# Patient Record
Sex: Male | Born: 1970 | Race: Black or African American | Hispanic: No | Marital: Single | Smoking: Former smoker
Health system: Southern US, Community
[De-identification: ages and names within clinical notes are randomized; demographics above are authoritative.]

## PROBLEM LIST (undated history)

## (undated) DIAGNOSIS — I219 Acute myocardial infarction, unspecified: Secondary | ICD-10-CM

## (undated) HISTORY — PX: PACEMAKER IMPLANT: EP1218

## (undated) HISTORY — DX: Acute myocardial infarction, unspecified: I21.9

---

## 2017-06-27 ENCOUNTER — Emergency Department
Admission: EM | Admit: 2017-06-27 | Discharge: 2017-06-27 | Disposition: A | Discharge status: Home / Self Care | Payer: Self-pay | Attending: Emergency Medicine | Admitting: Emergency Medicine

## 2017-06-27 ENCOUNTER — Emergency Department: Payer: Self-pay

## 2017-06-27 ENCOUNTER — Other Ambulatory Visit: Payer: Self-pay

## 2017-06-27 ENCOUNTER — Encounter: Payer: Self-pay | Admitting: Emergency Medicine

## 2017-06-27 DIAGNOSIS — S0081XA Abrasion of other part of head, initial encounter: Secondary | ICD-10-CM

## 2017-06-27 DIAGNOSIS — S0083XA Contusion of other part of head, initial encounter: Secondary | ICD-10-CM | POA: Insufficient documentation

## 2017-06-27 DIAGNOSIS — Y998 Other external cause status: Secondary | ICD-10-CM | POA: Insufficient documentation

## 2017-06-27 DIAGNOSIS — F17200 Nicotine dependence, unspecified, uncomplicated: Secondary | ICD-10-CM | POA: Insufficient documentation

## 2017-06-27 DIAGNOSIS — S0990XA Unspecified injury of head, initial encounter: Secondary | ICD-10-CM

## 2017-06-27 DIAGNOSIS — Y9355 Activity, bike riding: Secondary | ICD-10-CM | POA: Insufficient documentation

## 2017-06-27 DIAGNOSIS — Y92410 Unspecified street and highway as the place of occurrence of the external cause: Secondary | ICD-10-CM | POA: Insufficient documentation

## 2017-06-27 MED ORDER — MELOXICAM 15 MG PO TABS: 15.0000 mg | ORAL_TABLET | Freq: Every day | ORAL | 0 refills | Status: DC

## 2017-06-27 MED ORDER — OXYCODONE-ACETAMINOPHEN 5-325 MG PO TABS
1.0000 | ORAL_TABLET | Freq: Once | ORAL | Status: AC
  Administered 2017-06-27: 1 via ORAL
  Filled 2017-06-27: qty 1

## 2017-06-27 MED ORDER — HYDROCODONE-ACETAMINOPHEN 5-325 MG PO TABS: 1.0000 | ORAL_TABLET | ORAL | 0 refills | Status: DC | PRN

## 2017-06-27 MED ORDER — PREDNISONE 50 MG PO TABS: 50.0000 mg | ORAL_TABLET | Freq: Every day | ORAL | 0 refills | Status: DC
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
## 2019-05-27 ENCOUNTER — Other Ambulatory Visit: Payer: Self-pay

## 2019-05-27 ENCOUNTER — Encounter: Payer: Self-pay | Admitting: Intensive Care

## 2019-05-27 ENCOUNTER — Emergency Department: Payer: BLUE CROSS/BLUE SHIELD

## 2019-05-27 ENCOUNTER — Emergency Department
Admission: EM | Admit: 2019-05-27 | Discharge: 2019-05-27 | Disposition: A | Discharge status: Home / Self Care | Payer: BLUE CROSS/BLUE SHIELD | Attending: Student in an Organized Health Care Education/Training Program | Admitting: Student in an Organized Health Care Education/Training Program

## 2019-05-27 DIAGNOSIS — R079 Chest pain, unspecified: Secondary | ICD-10-CM

## 2019-05-27 DIAGNOSIS — R0789 Other chest pain: Secondary | ICD-10-CM | POA: Diagnosis present

## 2019-05-27 DIAGNOSIS — F17210 Nicotine dependence, cigarettes, uncomplicated: Secondary | ICD-10-CM | POA: Insufficient documentation

## 2019-05-27 LAB — CBC
HCT: 45.5 % (ref 39.0–52.0)
Hemoglobin: 14.4 g/dL (ref 13.0–17.0)
MCH: 27.5 pg (ref 26.0–34.0)
MCHC: 31.6 g/dL (ref 30.0–36.0)
MCV: 87 fL (ref 80.0–100.0)
Platelets: 276 10*3/uL (ref 150–400)
RBC: 5.23 MIL/uL (ref 4.22–5.81)
RDW: 12.8 % (ref 11.5–15.5)
WBC: 5.2 10*3/uL (ref 4.0–10.5)
nRBC: 0 % (ref 0.0–0.2)

## 2019-05-27 LAB — BASIC METABOLIC PANEL
Anion gap: 9 (ref 5–15)
BUN: 10 mg/dL (ref 6–20)
CO2: 26 mmol/L (ref 22–32)
Calcium: 8.8 mg/dL — ABNORMAL LOW (ref 8.9–10.3)
Chloride: 102 mmol/L (ref 98–111)
Creatinine, Ser: 1.05 mg/dL (ref 0.61–1.24)
GFR calc Af Amer: >60 mL/min (ref >60)
GFR calc non Af Amer: >60 mL/min (ref >60)
Glucose, Bld: 101 mg/dL — ABNORMAL HIGH (ref 70–99)
Potassium: 4.2 mmol/L (ref 3.5–5.1)
Sodium: 137 mmol/L (ref 135–145)

## 2019-05-27 LAB — TROPONIN I (HIGH SENSITIVITY)
Troponin I (High Sensitivity): <2 ng/L (ref <18)
Troponin I (High Sensitivity): <2 ng/L (ref <18)

## 2019-05-27 MED ORDER — PREDNISONE 20 MG PO TABS: 40.0000 mg | ORAL_TABLET | Freq: Every day | ORAL | 0 refills | Status: AC

## 2019-05-27 MED ORDER — HYDROCODONE-ACETAMINOPHEN 5-325 MG PO TABS
1.0000 | ORAL_TABLET | Freq: Once | ORAL | Status: AC
  Administered 2019-05-27: 1 via ORAL
  Filled 2019-05-27: qty 1

## 2019-05-27 MED ORDER — NAPROXEN 500 MG PO TABS: 500.0000 mg | ORAL_TABLET | Freq: Two times a day (BID) | ORAL | 0 refills | Status: AC

## 2019-05-27 MED ORDER — ALBUTEROL SULFATE HFA 108 (90 BASE) MCG/ACT IN AERS: 2.0000 | INHALATION_SPRAY | Freq: Four times a day (QID) | RESPIRATORY_TRACT | 1 refills | Status: DC | PRN

## 2019-05-27 NOTE — ED Notes (Signed)
Pt reports intermittent chest pain that started yesterday. Pt reports that the pain feels like it is coming from the inside. Pt reports that he recently quit smoking, pt bought a vape and has been using this. Pt believes that the pain may be coming from the vape. Pt reports that he was at work when the pain started. Pt denies any heavy lifting at work. Pt denies shortness of breath, N/V, diaphoresis, or previous cardiac hx.

## 2019-05-27 NOTE — ED Triage Notes (Signed)
First RN Note: Pt presents to ED via POV, c/o pain to L rib cage, pt states vaped yesterday after work and hasn't vaped since then.

## 2019-05-27 NOTE — ED Notes (Signed)
Light green tube sent down with white pt label, lab informed

## 2019-05-27 NOTE — ED Provider Notes (Signed)
[facility] Emergency Department Provider Note    First MD Initiated Contact with Patient 05/27/19 1552     (approximate)  I have reviewed the triage vital signs and the nursing notes.   HISTORY  Chief Complaint Chest Pain    HPI Blake Moore is a 49 y.o. male with no significant documented past medical history but does have roughly half pack per day smoking history presents the ER for 24 hours of left-sided chest pain and pressure.  Denies any shortness of breath.  States the pain is intermittent.  No associated diaphoresis.  No cough or fevers.  No congestion.  Works as a Investment banker, operational at [employer] but denies any trauma or heavy lifting.  Is never had pain like this before.  Denies any abdominal pain nausea or vomiting.  Has not taken anything for the pain.    History reviewed. No pertinent past medical history. History reviewed. No pertinent family history. History reviewed. No pertinent surgical history. There are no problems to display for this patient.     Prior to Admission medications   Medication Sig Start Date End Date Taking? Authorizing Provider  albuterol (VENTOLIN HFA) 108 (90 Base) MCG/ACT inhaler Inhale 2 puffs into the lungs every 6 (six) hours as needed for wheezing or shortness of breath. 05/27/19   Willy Eddy, MD  HYDROcodone-acetaminophen (NORCO/VICODIN) 5-325 MG tablet Take 1 tablet by mouth every 4 (four) hours as needed for moderate pain. 06/27/17   Cuthriell, Delorise Royals, PA-C  meloxicam (MOBIC) 15 MG tablet Take 1 tablet (15 mg total) by mouth daily. 06/27/17   Cuthriell, Delorise Royals, PA-C  naproxen (NAPROSYN) 500 MG tablet Take 1 tablet (500 mg total) by mouth 2 (two) times daily with a meal. 05/27/19 05/26/20  Willy Eddy, MD  predniSONE (DELTASONE) 20 MG tablet Take 2 tablets (40 mg total) by mouth daily for 5 days. 05/27/19 06/01/19  Willy Eddy, MD  predniSONE (DELTASONE) 50 MG tablet Take 1 tablet (50 mg total) by mouth  daily with breakfast. 06/27/17   Cuthriell, Delorise Royals, PA-C    Allergies Patient has no known allergies.    Social History Social History   Tobacco Use  . Smoking status: Current Some Day Smoker    Types: Cigarettes  . Smokeless tobacco: Never Used  Substance Use Topics  . Alcohol use: Yes    Alcohol/week: 4.0 standard drinks    Types: 4 Cans of beer per week  . Drug use: Yes    Types: Marijuana    Review of Systems Patient denies headaches, rhinorrhea, blurry vision, numbness, shortness of breath, chest pain, edema, cough, abdominal pain, nausea, vomiting, diarrhea, dysuria, fevers, rashes or hallucinations unless otherwise stated above in HPI. ____________________________________________   PHYSICAL EXAM:  VITAL SIGNS: Vitals:   05/27/19 1420 05/27/19 1750  BP: 102/66 108/65  Pulse: 78 (!) 54  Resp: 16 18  Temp: 98.7 F (37.1 C) 97.8 F (36.6 C)  SpO2: 99% 96%    Constitutional: Alert and oriented.  Eyes: Conjunctivae are normal.  Head: Atraumatic. Nose: No congestion/rhinnorhea. Mouth/Throat: Mucous membranes are moist.   Neck: No stridor. Painless ROM.  Cardiovascular: Normal rate, regular rhythm. Grossly normal heart sounds.  Good peripheral circulation. Respiratory: Normal respiratory effort.  No retractions. Lungs CTAB. Gastrointestinal: Soft and nontender. No distention. No abdominal bruits. No CVA tenderness. Genitourinary:  Musculoskeletal: No lower extremity tenderness nor edema.  No joint effusions. Neurologic:  Normal speech and language. No gross focal neurologic deficits are  appreciated. No facial droop Skin:  Skin is warm, dry and intact. No rash noted. Psychiatric: Mood and affect are normal. Speech and behavior are normal.  ____________________________________________   LABS (all labs ordered are listed, but only abnormal results are displayed)  Results for orders placed or performed during the hospital encounter of 05/27/19 (from the past  24 hour(s))  Basic metabolic panel     Status: Abnormal   Collection Time: 05/27/19  2:24 PM  Result Value Ref Range   Sodium 137 135 - 145 mmol/L   Potassium 4.2 3.5 - 5.1 mmol/L   Chloride 102 98 - 111 mmol/L   CO2 26 22 - 32 mmol/L   Glucose, Bld 101 (H) 70 - 99 mg/dL   BUN 10 6 - 20 mg/dL   Creatinine, Ser 8.58 0.61 - 1.24 mg/dL   Calcium 8.8 (L) 8.9 - 10.3 mg/dL   GFR calc non Af Amer >60 >60 mL/min   GFR calc Af Amer >60 >60 mL/min   Anion gap 9 5 - 15  CBC     Status: None   Collection Time: 05/27/19  2:24 PM  Result Value Ref Range   WBC 5.2 4.0 - 10.5 K/uL   RBC 5.23 4.22 - 5.81 MIL/uL   Hemoglobin 14.4 13.0 - 17.0 g/dL   HCT 85.0 27.7 - 41.2 %   MCV 87.0 80.0 - 100.0 fL   MCH 27.5 26.0 - 34.0 pg   MCHC 31.6 30.0 - 36.0 g/dL   RDW 87.8 67.6 - 72.0 %   Platelets 276 150 - 400 K/uL   nRBC 0.0 0.0 - 0.2 %  Troponin I (High Sensitivity)     Status: None   Collection Time: 05/27/19  2:24 PM  Result Value Ref Range   Troponin I (High Sensitivity) <2 <18 ng/L  Troponin I (High Sensitivity)     Status: None   Collection Time: 05/27/19  4:53 PM  Result Value Ref Range   Troponin I (High Sensitivity) <2 <18 ng/L   ____________________________________________  EKG My review and personal interpretation at Time: 14:14   Indication: chest pain  Rate: 75  Rhythm: sinus Axis: normal Other: concave upwards st abnormality without reciprocal changes, no pr depression, no stemi criteria, likely BER ____________________________________________  RADIOLOGY  I personally reviewed all radiographic images ordered to evaluate for the above acute complaints and reviewed radiology reports and findings.  These findings were personally discussed with the patient.  Please see medical record for radiology report.  ____________________________________________   PROCEDURES  Procedure(s) performed:  Procedures    Critical Care performed:  no ____________________________________________   INITIAL IMPRESSION / ASSESSMENT AND PLAN / ED COURSE  Pertinent labs & imaging results that were available during my care of the patient were reviewed by me and considered in my medical decision making (see chart for details).   DDX: ACS, pericarditis, esophagitis, boerhaaves, pe, dissection, pna, bronchitis, costochondritis   Pratyush Ammon is a 49 y.o. who presents to the ED with symptoms as described above.  Patient low risk by heart score.  Has nonspecific ST changes likely secondary to benign early repole.  Low risk by Wells criteria and is PERC negative.  Not consistent with dissection.  Abdominal exam is soft and benign.  Given his smoking history suspect bronchitis will order chest x-ray will order serial enzymes.  Clinical Course as of May 26 1754  Caleen Essex May 27, 2019  1729 Repeat troponin negative.  Remains hemodynamically stable in no acute distress.  Will treat for bronchitis.  Have discussed with the patient and available family all diagnostics and treatments performed thus far and all questions were answered to the best of my ability. The patient demonstrates understanding and agreement with plan.    [PR]    Clinical Course User Index [PR] Merlyn Lot, MD   The patient was evaluated in Emergency Department today for the symptoms described in the history of present illness. He/she was evaluated in the context of the global COVID-19 pandemic, which necessitated consideration that the patient might be at risk for infection with the SARS-CoV-2 virus that causes COVID-19. Institutional protocols and algorithms that pertain to the evaluation of patients at risk for COVID-19 are in a state of rapid change based on information released by regulatory bodies including the CDC and federal and state organizations. These policies and algorithms were followed during the patient's care in the ED. Patient declined Covid testing  As part of my  medical decision making, I reviewed the following data within the [place] notes reviewed and incorporated, Labs reviewed, notes from prior ED visits and [place] Controlled Substance Database   ____________________________________________   FINAL CLINICAL IMPRESSION(S) / ED DIAGNOSES  Final diagnoses:  Chest pain, unspecified type      NEW MEDICATIONS STARTED DURING THIS VISIT:  Discharge Medication List as of 05/27/2019  5:35 PM    START taking these medications   Details  albuterol (VENTOLIN HFA) 108 (90 Base) MCG/ACT inhaler Inhale 2 puffs into the lungs every 6 (six) hours as needed for wheezing or shortness of breath., Starting Fri 05/27/2019, Normal    naproxen (NAPROSYN) 500 MG tablet Take 1 tablet (500 mg total) by mouth 2 (two) times daily with a meal., Starting Fri 05/27/2019, Until Sat 05/26/2020, Normal    !! predniSONE (DELTASONE) 20 MG tablet Take 2 tablets (40 mg total) by mouth daily for 5 days., Starting Fri 05/27/2019, Until Wed 06/01/2019, Normal     !! - Potential duplicate medications found. Please discuss with provider.       Note:  This document was prepared using Dragon voice recognition software and may include unintentional dictation errors.    Merlyn Lot, MD 05/27/19 [phone]

## 2019-05-27 NOTE — ED Triage Notes (Signed)
Patient c/o poking/grabbing left chest pain that started last night. Also felt this AM. No radiation

## 2020-09-18 ENCOUNTER — Other Ambulatory Visit: Payer: Self-pay

## 2020-09-18 ENCOUNTER — Emergency Department
Admission: EM | Admit: 2020-09-18 | Discharge: 2020-09-18 | Disposition: A | Discharge status: Home / Self Care | Payer: BLUE CROSS/BLUE SHIELD | Attending: Student in an Organized Health Care Education/Training Program | Admitting: Student in an Organized Health Care Education/Training Program

## 2020-09-18 DIAGNOSIS — R112 Nausea with vomiting, unspecified: Secondary | ICD-10-CM | POA: Insufficient documentation

## 2020-09-18 DIAGNOSIS — R42 Dizziness and giddiness: Secondary | ICD-10-CM | POA: Insufficient documentation

## 2020-09-18 DIAGNOSIS — F17210 Nicotine dependence, cigarettes, uncomplicated: Secondary | ICD-10-CM | POA: Insufficient documentation

## 2020-09-18 LAB — CBC
HCT: 44.2 % (ref 39.0–52.0)
Hemoglobin: 14.4 g/dL (ref 13.0–17.0)
MCH: 27.4 pg (ref 26.0–34.0)
MCHC: 32.6 g/dL (ref 30.0–36.0)
MCV: 84.2 fL (ref 80.0–100.0)
Platelets: 289 10*3/uL (ref 150–400)
RBC: 5.25 MIL/uL (ref 4.22–5.81)
RDW: 13.1 % (ref 11.5–15.5)
WBC: 5.4 10*3/uL (ref 4.0–10.5)
nRBC: 0 % (ref 0.0–0.2)

## 2020-09-18 LAB — BASIC METABOLIC PANEL
Anion gap: 8 (ref 5–15)
BUN: 10 mg/dL (ref 6–20)
CO2: 25 mmol/L (ref 22–32)
Calcium: 8.9 mg/dL (ref 8.9–10.3)
Chloride: 104 mmol/L (ref 98–111)
Creatinine, Ser: 0.86 mg/dL (ref 0.61–1.24)
GFR, Estimated: >60 mL/min (ref >60)
Glucose, Bld: 96 mg/dL (ref 70–99)
Potassium: 3.8 mmol/L (ref 3.5–5.1)
Sodium: 137 mmol/L (ref 135–145)

## 2020-09-18 LAB — HEPATIC FUNCTION PANEL
ALT: 15 U/L (ref 0–44)
AST: 18 U/L (ref 15–41)
Albumin: 4.3 g/dL (ref 3.5–5.0)
Alkaline Phosphatase: 59 U/L (ref 38–126)
Bilirubin, Direct: <0.1 mg/dL (ref 0.0–0.2)
Total Bilirubin: 0.8 mg/dL (ref 0.3–1.2)
Total Protein: 7.4 g/dL (ref 6.5–8.1)

## 2020-09-18 LAB — LIPASE, BLOOD: Lipase: 28 U/L (ref 11–51)

## 2020-09-18 NOTE — ED Triage Notes (Signed)
Pt comes with c/o dizziness and vomiting that started this am. Pt denies any other complaints.

## 2020-09-18 NOTE — ED Provider Notes (Signed)
[facility] Emergency Department Provider Note    Event Date/Time   First MD Initiated Contact with Patient 09/18/20 1312     (approximate)  I have reviewed the triage vital signs and the nursing notes.   HISTORY  Chief Complaint Dizziness    HPI Blake Moore is a 50 y.o. male to ER for evaluation of episode of nausea dizziness that occurred this morning after he got out of bed.  Denies any symptoms at this time.  Denied any pain.  States he felt woozy and lightheaded when he stood up.  Felt like he might be a little dehydrated so he drank some water and then had an episode of nonbilious nonbloody emesis.  Denies any abdominal pain.  Did endorse drinking some beers last night.  Denies any numbness or tingling.  No double vision.  Does occasionally smoke black and milds.  Denies any trauma.    History reviewed. No pertinent past medical history. No family history on file. History reviewed. No pertinent surgical history. There are no problems to display for this patient.     Prior to Admission medications   Medication Sig Start Date End Date Taking? Authorizing Provider  albuterol (VENTOLIN HFA) 108 (90 Base) MCG/ACT inhaler Inhale 2 puffs into the lungs every 6 (six) hours as needed for wheezing or shortness of breath. 05/27/19   Willy Eddy, MD  HYDROcodone-acetaminophen (NORCO/VICODIN) 5-325 MG tablet Take 1 tablet by mouth every 4 (four) hours as needed for moderate pain. 06/27/17   Cuthriell, Delorise Royals, PA-C  meloxicam (MOBIC) 15 MG tablet Take 1 tablet (15 mg total) by mouth daily. 06/27/17   Cuthriell, Delorise Royals, PA-C  predniSONE (DELTASONE) 50 MG tablet Take 1 tablet (50 mg total) by mouth daily with breakfast. 06/27/17   Cuthriell, Delorise Royals, PA-C    Allergies Patient has no known allergies.    Social History Social History   Tobacco Use  . Smoking status: Current Some Day Smoker    Types: Cigarettes  . Smokeless tobacco: Never Used   Substance Use Topics  . Alcohol use: Yes    Alcohol/week: 4.0 standard drinks    Types: 4 Cans of beer per week    Comment: everyday, couple of beers  . Drug use: Not Currently    Types: Marijuana    Review of Systems Patient denies headaches, rhinorrhea, blurry vision, numbness, shortness of breath, chest pain, edema, cough, abdominal pain, nausea, vomiting, diarrhea, dysuria, fevers, rashes or hallucinations unless otherwise stated above in HPI. ____________________________________________   PHYSICAL EXAM:  VITAL SIGNS: Vitals:   09/18/20 1154  BP: 128/79  Pulse: 72  Resp: 18  Temp: 98.6 F (37 C)  SpO2: 100%    Constitutional: Alert and oriented.  Eyes: Conjunctivae are normal.  Head: Atraumatic. Nose: No congestion/rhinnorhea. Mouth/Throat: Mucous membranes are moist.   Neck: No stridor. Painless ROM.  Cardiovascular: Normal rate, regular rhythm. Grossly normal heart sounds.  Good peripheral circulation. Respiratory: Normal respiratory effort.  No retractions. Lungs CTAB. Gastrointestinal: Soft and nontender. No distention. No abdominal bruits. No CVA tenderness. Genitourinary:  Musculoskeletal: No lower extremity tenderness nor edema.  No joint effusions. Neurologic:  CN- intact.  No facial droop, Normal FNF.  Normal heel to shin.  Sensation intact bilaterally. Normal speech and language. No gross focal neurologic deficits are appreciated. No gait instability.  Benign HINTs exam Skin:  Skin is warm, dry and intact. No rash noted. Psychiatric: Mood and affect are normal. Speech and behavior are  normal.  ____________________________________________   LABS (all labs ordered are listed, but only abnormal results are displayed)  Results for orders placed or performed during the hospital encounter of 09/18/20 (from the past 24 hour(s))  Basic metabolic panel     Status: None   Collection Time: 09/18/20 11:54 AM  Result Value Ref Range   Sodium 137 135 - 145 mmol/L    Potassium 3.8 3.5 - 5.1 mmol/L   Chloride 104 98 - 111 mmol/L   CO2 25 22 - 32 mmol/L   Glucose, Bld 96 70 - 99 mg/dL   BUN 10 6 - 20 mg/dL   Creatinine, Ser 0.86 0.61 - 1.24 mg/dL   Calcium 8.9 8.9 - 57.8 mg/dL   GFR, Estimated >46 >96 mL/min   Anion gap 8 5 - 15  CBC     Status: None   Collection Time: 09/18/20 11:54 AM  Result Value Ref Range   WBC 5.4 4.0 - 10.5 K/uL   RBC 5.25 4.22 - 5.81 MIL/uL   Hemoglobin 14.4 13.0 - 17.0 g/dL   HCT 29.5 28.4 - 13.2 %   MCV 84.2 80.0 - 100.0 fL   MCH 27.4 26.0 - 34.0 pg   MCHC 32.6 30.0 - 36.0 g/dL   RDW 44.0 10.2 - 72.5 %   Platelets 289 150 - 400 K/uL   nRBC 0.0 0.0 - 0.2 %  Hepatic function panel     Status: None   Collection Time: 09/18/20 11:54 AM  Result Value Ref Range   Total Protein 7.4 6.5 - 8.1 g/dL   Albumin 4.3 3.5 - 5.0 g/dL   AST 18 15 - 41 U/L   ALT 15 0 - 44 U/L   Alkaline Phosphatase 59 38 - 126 U/L   Total Bilirubin 0.8 0.3 - 1.2 mg/dL   Bilirubin, Direct <3.6 0.0 - 0.2 mg/dL   Indirect Bilirubin NOT CALCULATED 0.3 - 0.9 mg/dL  Lipase, blood     Status: None   Collection Time: 09/18/20 11:54 AM  Result Value Ref Range   Lipase 28 11 - 51 U/L   ____________________________________________  EKG My review and personal interpretation at Time: 11:54   Indication: dizziness  Rate: 70  Rhythm: sinus Axis: normal Other: borderline lvh, no stemi, v2 and v3 reversal.  consisten with previous tracing  ____________________________________________  RADIOLOGY   ____________________________________________   PROCEDURES  Procedure(s) performed:  Procedures    Critical Care performed: no ____________________________________________   INITIAL IMPRESSION / ASSESSMENT AND PLAN / ED COURSE  Pertinent labs & imaging results that were available during my care of the patient were reviewed by me and considered in my medical decision making (see chart for details).   DDX: Dehydration, enteritis, gastritis,  electrolyte abnormality, pancreatitis, CVA, BPPV  Blake Moore is a 50 y.o. who presents to the ED with presentation as described above.  Patient is asymptomatic and well-appearing on [place].  His neuro exam was completely benign.  Benign hints exam.  Is not consistent with TIA or CVA.  Possible vertiginous symptoms as he had symptoms when he stood up but feel like this is more likely mild dehydration blood work is reassuring.  Tolerating p.o.  Denies any chest pain or shortness of breath.  Abdominal exam soft and benign.  Does appear stable appropriate for outpatient follow-up.  Patient agreeable to plan.  Discussed signs and sx for which he should to the ER.     The patient was evaluated in Emergency Department today for  the symptoms described in the history of present illness. He/she was evaluated in the context of the global COVID-19 pandemic, which necessitated consideration that the patient might be at risk for infection with the SARS-CoV-2 virus that causes COVID-19. Institutional protocols and algorithms that pertain to the evaluation of patients at risk for COVID-19 are in a state of rapid change based on information released by regulatory bodies including the CDC and federal and state organizations. These policies and algorithms were followed during the patient's care in the ED.  As part of my medical decision making, I reviewed the following data within the electronic MEDICAL RECORD NUMBER Nursing notes reviewed and incorporated, Labs reviewed, notes from prior ED visits and [place] Controlled Substance Database   ____________________________________________   FINAL CLINICAL IMPRESSION(S) / ED DIAGNOSES  Final diagnoses:  Dizziness  Lightheadedness      NEW MEDICATIONS STARTED DURING THIS VISIT:  New Prescriptions   No medications on file     Note:  This document was prepared using Dragon voice recognition software and may include unintentional dictation errors.    Willy Eddy, MD 09/18/20 1351

## 2020-09-20 ENCOUNTER — Telehealth: Payer: Self-pay

## 2020-09-20 NOTE — Telephone Encounter (Signed)
After receiving an ER referral for pt, called and explained application process. Pt is interested in becoming a pt and I mailed him an application.   MD 09/20/20 @ 1:55 pm

## 2021-10-10 ENCOUNTER — Other Ambulatory Visit: Payer: Self-pay

## 2021-10-10 ENCOUNTER — Emergency Department
Admission: EM | Admit: 2021-10-10 | Discharge: 2021-10-10 | Disposition: A | Discharge status: Home / Self Care | Payer: 59 | Attending: Emergency Medicine | Admitting: Emergency Medicine

## 2021-10-10 ENCOUNTER — Emergency Department: Payer: 59

## 2021-10-10 DIAGNOSIS — R0789 Other chest pain: Secondary | ICD-10-CM | POA: Diagnosis not present

## 2021-10-10 DIAGNOSIS — R079 Chest pain, unspecified: Secondary | ICD-10-CM | POA: Diagnosis not present

## 2021-10-10 LAB — CBC
HCT: 43.7 % (ref 39.0–52.0)
Hemoglobin: 13.4 g/dL (ref 13.0–17.0)
MCH: 26.6 pg (ref 26.0–34.0)
MCHC: 30.7 g/dL (ref 30.0–36.0)
MCV: 86.9 fL (ref 80.0–100.0)
Platelets: 286 10*3/uL (ref 150–400)
RBC: 5.03 MIL/uL (ref 4.22–5.81)
RDW: 12.8 % (ref 11.5–15.5)
WBC: 6 10*3/uL (ref 4.0–10.5)
nRBC: 0 % (ref 0.0–0.2)

## 2021-10-10 LAB — BASIC METABOLIC PANEL
Anion gap: 6 (ref 5–15)
BUN: 15 mg/dL (ref 6–20)
CO2: 29 mmol/L (ref 22–32)
Calcium: 9 mg/dL (ref 8.9–10.3)
Chloride: 103 mmol/L (ref 98–111)
Creatinine, Ser: 0.89 mg/dL (ref 0.61–1.24)
GFR, Estimated: >60 mL/min (ref >60)
Glucose, Bld: 104 mg/dL — ABNORMAL HIGH (ref 70–99)
Potassium: 4.3 mmol/L (ref 3.5–5.1)
Sodium: 138 mmol/L (ref 135–145)

## 2021-10-10 LAB — TROPONIN I (HIGH SENSITIVITY): Troponin I (High Sensitivity): <2 ng/L (ref <18)

## 2021-10-10 NOTE — ED Provider Notes (Signed)
[facility] Provider Note    Event Date/Time   First MD Initiated Contact with Patient 10/10/21 1144     (approximate)   History   Chief Complaint Chest Pain   HPI  Blake Moore is a 51 y.o. male with no significant past medical history who presents to the ED complaining of chest pain.  Patient reports that he has been dealing with 1 week of intermittent dull aching pain in the left side of his chest.  Pain can come on at any time and is not exacerbated or alleviated by anything in particular.  He denies any associated fevers, cough, shortness of breath, nausea, vomiting, or abdominal pain.  He states that pain will typically last for a few minutes before resolving on its own.  When he had another episode today while at work about 3 hours ago, he decided to present to the walk-in clinic, was subsequently referred to the ED for further evaluation.  He now states that the pain in his chest has resolved.     Physical Exam   Triage Vital Signs: ED Triage Vitals  Enc Vitals Group     BP 10/10/21 1053 118/75     Pulse Rate 10/10/21 1053 73     Resp 10/10/21 1053 16     Temp 10/10/21 1053 98.8 F (37.1 C)     Temp Source 10/10/21 1053 Oral     SpO2 10/10/21 1053 97 %     Weight 10/10/21 1051 180 lb (81.6 kg)     Height 10/10/21 1051 6\' 1"  (1.854 m)     Head Circumference --      Peak Flow --      Pain Score --      Pain Loc --      Pain Edu? --      Excl. in [place]? --     Most recent vital signs: Vitals:   10/10/21 1053  BP: 118/75  Pulse: 73  Resp: 16  Temp: 98.8 F (37.1 C)  SpO2: 97%    Constitutional: Alert and oriented. Eyes: Conjunctivae are normal. Head: Atraumatic. Nose: No congestion/rhinnorhea. Mouth/Throat: Mucous membranes are moist.  Cardiovascular: Normal rate, regular rhythm. Grossly normal heart sounds.  2+ radial pulses bilaterally. Respiratory: Normal respiratory effort.  No retractions. Lungs CTAB.  No chest wall  tenderness to palpation. Gastrointestinal: Soft and nontender. No distention. Musculoskeletal: No lower extremity tenderness nor edema.  Neurologic:  Normal speech and language. No gross focal neurologic deficits are appreciated.    ED Results / Procedures / Treatments   Labs (all labs ordered are listed, but only abnormal results are displayed) Labs Reviewed  BASIC METABOLIC PANEL - Abnormal; Notable for the following components:      Result Value   Glucose, Bld 104 (*)    All other components within normal limits  CBC  TROPONIN I (HIGH SENSITIVITY)     EKG  ED ECG REPORT I, Blake Divine, the attending physician, personally viewed and interpreted this ECG.   Date: 10/10/2021  EKG Time: 10:56  Rate: 72  Rhythm: normal sinus rhythm  Axis: Normal  Intervals:none  ST&T Change: Nonspecific ST changes, similar to previous  RADIOLOGY Chest x-ray reviewed and interpreted by me with no infiltrate, edema, or effusion.  PROCEDURES:  Critical Care performed: No  Procedures   MEDICATIONS ORDERED IN ED: Medications - No data to display   IMPRESSION / MDM / [place] / ED COURSE  I reviewed the triage  vital signs and the nursing notes.                              51 y.o. male with no significant past medical history presents to the ED complaining of intermittent left-sided dull aching pain in his chest present for the past week.  Differential diagnosis includes, but is not limited to, ACS, PE, pneumonia, pneumothorax, GERD, musculoskeletal pain.  Patient well-appearing and in no acute distress, vital signs are unremarkable and he states that chest pain has resolved.  EKG shows no evidence of arrhythmia or ischemia and troponin is negative, doubt ACS given symptoms for 1 week and symptoms starting greater than 3 hours ago today.  Chest x-ray is unremarkable and remainder of labs are reassuring, CBC shows no anemia or leukocytosis, BMP without electrolyte  abnormality or AKI.  Doubt PE or dissection given resolution of pain.  Patient is appropriate for discharge home and was counseled to establish care with PCP, otherwise return to the ED for new or worsening symptoms.  Patient agrees with plan.      FINAL CLINICAL IMPRESSION(S) / ED DIAGNOSES   Final diagnoses:  Nonspecific chest pain     Rx / DC Orders   ED Discharge Orders     None        Note:  This document was prepared using Dragon voice recognition software and may include unintentional dictation errors.   Blake Divine, MD 10/10/21 1208

## 2021-10-10 NOTE — ED Notes (Signed)
See triage note, pt reports upper left chest pain for the past couple days that is getting more constant. Denies n/v/shob, dizziness.  NAD noted. Speaking in complete sentences, RR even and unlabored.

## 2021-10-10 NOTE — ED Triage Notes (Signed)
Pt sent from [facility] with c/o left sided chest pain for the past week. Denies SOB , N/V or diaphoresis. Pt is in NAD at present

## 2021-10-25 ENCOUNTER — Emergency Department: Payer: 59

## 2021-10-25 ENCOUNTER — Inpatient Hospital Stay: Payer: 59

## 2021-10-25 ENCOUNTER — Inpatient Hospital Stay (HOSPITAL_COMMUNITY)
Admit: 2021-10-25 | Discharge: 2021-10-25 | Disposition: A | Discharge status: Home / Self Care | Payer: 59 | Attending: Internal Medicine | Admitting: Internal Medicine

## 2021-10-25 ENCOUNTER — Inpatient Hospital Stay
Admission: EM | Admit: 2021-10-25 | Discharge: 2021-10-31 | DRG: 224 | Disposition: A | Discharge status: Home / Self Care | Payer: 59 | Attending: Internal Medicine | Admitting: Internal Medicine

## 2021-10-25 ENCOUNTER — Encounter: Payer: Self-pay | Admitting: Emergency Medicine

## 2021-10-25 DIAGNOSIS — J9602 Acute respiratory failure with hypercapnia: Secondary | ICD-10-CM | POA: Diagnosis not present

## 2021-10-25 DIAGNOSIS — I420 Dilated cardiomyopathy: Secondary | ICD-10-CM | POA: Diagnosis not present

## 2021-10-25 DIAGNOSIS — J690 Pneumonitis due to inhalation of food and vomit: Secondary | ICD-10-CM | POA: Diagnosis not present

## 2021-10-25 DIAGNOSIS — I462 Cardiac arrest due to underlying cardiac condition: Secondary | ICD-10-CM | POA: Diagnosis present

## 2021-10-25 DIAGNOSIS — D649 Anemia, unspecified: Secondary | ICD-10-CM | POA: Diagnosis not present

## 2021-10-25 DIAGNOSIS — E876 Hypokalemia: Secondary | ICD-10-CM

## 2021-10-25 DIAGNOSIS — Z452 Encounter for adjustment and management of vascular access device: Secondary | ICD-10-CM | POA: Diagnosis not present

## 2021-10-25 DIAGNOSIS — I201 Angina pectoris with documented spasm: Secondary | ICD-10-CM | POA: Diagnosis present

## 2021-10-25 DIAGNOSIS — G9341 Metabolic encephalopathy: Secondary | ICD-10-CM | POA: Diagnosis present

## 2021-10-25 DIAGNOSIS — I5021 Acute systolic (congestive) heart failure: Secondary | ICD-10-CM | POA: Diagnosis present

## 2021-10-25 DIAGNOSIS — Z743 Need for continuous supervision: Secondary | ICD-10-CM | POA: Diagnosis not present

## 2021-10-25 DIAGNOSIS — I428 Other cardiomyopathies: Secondary | ICD-10-CM

## 2021-10-25 DIAGNOSIS — D72820 Lymphocytosis (symptomatic): Secondary | ICD-10-CM | POA: Diagnosis present

## 2021-10-25 DIAGNOSIS — R918 Other nonspecific abnormal finding of lung field: Secondary | ICD-10-CM | POA: Diagnosis not present

## 2021-10-25 DIAGNOSIS — F17210 Nicotine dependence, cigarettes, uncomplicated: Secondary | ICD-10-CM | POA: Diagnosis present

## 2021-10-25 DIAGNOSIS — R4020 Unspecified coma: Secondary | ICD-10-CM | POA: Diagnosis not present

## 2021-10-25 DIAGNOSIS — R000 Tachycardia, unspecified: Secondary | ICD-10-CM | POA: Diagnosis not present

## 2021-10-25 DIAGNOSIS — J9811 Atelectasis: Secondary | ICD-10-CM | POA: Diagnosis not present

## 2021-10-25 DIAGNOSIS — I469 Cardiac arrest, cause unspecified: Secondary | ICD-10-CM | POA: Diagnosis not present

## 2021-10-25 DIAGNOSIS — I4901 Ventricular fibrillation: Principal | ICD-10-CM | POA: Diagnosis present

## 2021-10-25 DIAGNOSIS — Z4682 Encounter for fitting and adjustment of non-vascular catheter: Secondary | ICD-10-CM | POA: Diagnosis not present

## 2021-10-25 DIAGNOSIS — I213 ST elevation (STEMI) myocardial infarction of unspecified site: Secondary | ICD-10-CM | POA: Diagnosis not present

## 2021-10-25 DIAGNOSIS — I499 Cardiac arrhythmia, unspecified: Secondary | ICD-10-CM | POA: Diagnosis not present

## 2021-10-25 DIAGNOSIS — S199XXA Unspecified injury of neck, initial encounter: Secondary | ICD-10-CM | POA: Diagnosis not present

## 2021-10-25 DIAGNOSIS — Z95810 Presence of automatic (implantable) cardiac defibrillator: Secondary | ICD-10-CM | POA: Diagnosis not present

## 2021-10-25 DIAGNOSIS — J9601 Acute respiratory failure with hypoxia: Secondary | ICD-10-CM | POA: Diagnosis present

## 2021-10-25 DIAGNOSIS — F419 Anxiety disorder, unspecified: Secondary | ICD-10-CM | POA: Diagnosis not present

## 2021-10-25 DIAGNOSIS — R404 Transient alteration of awareness: Secondary | ICD-10-CM | POA: Diagnosis not present

## 2021-10-25 LAB — MAGNESIUM
Magnesium: 2.3 mg/dL (ref 1.7–2.4)
Magnesium: 2.1 mg/dL (ref 1.7–2.4)

## 2021-10-25 LAB — LIPID PANEL
Cholesterol: 124 mg/dL (ref 0–200)
HDL: 41 mg/dL (ref >40)
LDL Cholesterol: 75 mg/dL (ref 0–99)
Total CHOL/HDL Ratio: 3 RATIO
Triglycerides: 38 mg/dL (ref <150)
VLDL: 8 mg/dL (ref 0–40)

## 2021-10-25 LAB — CBC WITH DIFFERENTIAL/PLATELET
Abs Immature Granulocytes: 0.11 10*3/uL — ABNORMAL HIGH (ref 0.00–0.07)
Abs Immature Granulocytes: 0.03 10*3/uL (ref 0.00–0.07)
Basophils Absolute: 0.1 10*3/uL (ref 0.0–0.1)
Basophils Absolute: 0 10*3/uL (ref 0.0–0.1)
Basophils Relative: 1 %
Basophils Relative: 0 %
Eosinophils Absolute: 0.3 10*3/uL (ref 0.0–0.5)
Eosinophils Absolute: 0 10*3/uL (ref 0.0–0.5)
Eosinophils Relative: 3 %
Eosinophils Relative: 0 %
HCT: 43.7 % (ref 39.0–52.0)
HCT: 37.7 % — ABNORMAL LOW (ref 39.0–52.0)
Hemoglobin: 13.3 g/dL (ref 13.0–17.0)
Hemoglobin: 11.9 g/dL — ABNORMAL LOW (ref 13.0–17.0)
Immature Granulocytes: 1 %
Immature Granulocytes: 0 %
Lymphocytes Relative: 49 %
Lymphocytes Relative: 8 %
Lymphs Abs: 4.5 10*3/uL — ABNORMAL HIGH (ref 0.7–4.0)
Lymphs Abs: 0.8 10*3/uL (ref 0.7–4.0)
MCH: 26.3 pg (ref 26.0–34.0)
MCH: 26.6 pg (ref 26.0–34.0)
MCHC: 30.4 g/dL (ref 30.0–36.0)
MCHC: 31.6 g/dL (ref 30.0–36.0)
MCV: 86.5 fL (ref 80.0–100.0)
MCV: 84.3 fL (ref 80.0–100.0)
Monocytes Absolute: 0.9 10*3/uL (ref 0.1–1.0)
Monocytes Absolute: 1 10*3/uL (ref 0.1–1.0)
Monocytes Relative: 10 %
Monocytes Relative: 10 %
Neutro Abs: 3.3 10*3/uL (ref 1.7–7.7)
Neutro Abs: 7.3 10*3/uL (ref 1.7–7.7)
Neutrophils Relative %: 36 %
Neutrophils Relative %: 82 %
Platelets: 364 10*3/uL (ref 150–400)
Platelets: 275 10*3/uL (ref 150–400)
RBC: 5.05 MIL/uL (ref 4.22–5.81)
RBC: 4.47 MIL/uL (ref 4.22–5.81)
RDW: 12.7 % (ref 11.5–15.5)
RDW: 12.6 % (ref 11.5–15.5)
WBC: 9.2 10*3/uL (ref 4.0–10.5)
WBC: 9.1 10*3/uL (ref 4.0–10.5)
nRBC: 0 % (ref 0.0–0.2)
nRBC: 0 % (ref 0.0–0.2)

## 2021-10-25 LAB — BLOOD GAS, ARTERIAL
Acid-base deficit: 4.2 mmol/L — ABNORMAL HIGH (ref 0.0–2.0)
Bicarbonate: 20.1 mmol/L (ref 20.0–28.0)
FIO2: 60 %
MECHVT: 500 mL
Mechanical Rate: 18
O2 Saturation: 99.8 %
PEEP: 5 cmH2O
Patient temperature: 37
pCO2 arterial: 34 mmHg (ref 32–48)
pH, Arterial: 7.38 (ref 7.35–7.45)
pO2, Arterial: 151 mmHg — ABNORMAL HIGH (ref 83–108)

## 2021-10-25 LAB — COMPREHENSIVE METABOLIC PANEL
ALT: 101 U/L — ABNORMAL HIGH (ref 0–44)
AST: 126 U/L — ABNORMAL HIGH (ref 15–41)
Albumin: 3.7 g/dL (ref 3.5–5.0)
Alkaline Phosphatase: 93 U/L (ref 38–126)
Anion gap: 14 (ref 5–15)
BUN: 18 mg/dL (ref 6–20)
CO2: 19 mmol/L — ABNORMAL LOW (ref 22–32)
Calcium: 8.3 mg/dL — ABNORMAL LOW (ref 8.9–10.3)
Chloride: 107 mmol/L (ref 98–111)
Creatinine, Ser: 1.09 mg/dL (ref 0.61–1.24)
GFR, Estimated: >60 mL/min (ref >60)
Glucose, Bld: 242 mg/dL — ABNORMAL HIGH (ref 70–99)
Potassium: 2.7 mmol/L — CL (ref 3.5–5.1)
Sodium: 140 mmol/L (ref 135–145)
Total Bilirubin: 0.3 mg/dL (ref 0.3–1.2)
Total Protein: 7.1 g/dL (ref 6.5–8.1)

## 2021-10-25 LAB — GLUCOSE, CAPILLARY
Glucose-Capillary: 152 mg/dL — ABNORMAL HIGH (ref 70–99)
Glucose-Capillary: 104 mg/dL — ABNORMAL HIGH (ref 70–99)
Glucose-Capillary: 98 mg/dL (ref 70–99)
Glucose-Capillary: 117 mg/dL — ABNORMAL HIGH (ref 70–99)
Glucose-Capillary: 85 mg/dL (ref 70–99)

## 2021-10-25 LAB — URINE DRUG SCREEN, QUALITATIVE (ARMC ONLY)
Amphetamines, Ur Screen: NOT DETECTED
Barbiturates, Ur Screen: NOT DETECTED
Benzodiazepine, Ur Scrn: NOT DETECTED
Cannabinoid 50 Ng, Ur ~~LOC~~: POSITIVE — AB
Cocaine Metabolite,Ur ~~LOC~~: NOT DETECTED
MDMA (Ecstasy)Ur Screen: NOT DETECTED
Methadone Scn, Ur: NOT DETECTED
Opiate, Ur Screen: NOT DETECTED
Phencyclidine (PCP) Ur S: NOT DETECTED
Tricyclic, Ur Screen: NOT DETECTED

## 2021-10-25 LAB — ECHOCARDIOGRAM COMPLETE
AR max vel: 3.52 cm2
AV Area VTI: 3.02 cm2
AV Area mean vel: 3.26 cm2
AV Mean grad: 4 mmHg
AV Peak grad: 6.9 mmHg
Ao pk vel: 1.31 m/s
Area-P 1/2: 4.46 cm2
Height: 73 in
MV VTI: 4.04 cm2
S' Lateral: 3.65 cm
Weight: 2998.26 oz

## 2021-10-25 LAB — PATHOLOGIST SMEAR REVIEW

## 2021-10-25 LAB — URINALYSIS, ROUTINE W REFLEX MICROSCOPIC
Bilirubin Urine: NEGATIVE
Glucose, UA: 50 mg/dL — AB
Hgb urine dipstick: NEGATIVE
Ketones, ur: NEGATIVE mg/dL
Leukocytes,Ua: NEGATIVE
Nitrite: NEGATIVE
Protein, ur: 100 mg/dL — AB
Specific Gravity, Urine: 1.019 (ref 1.005–1.030)
pH: 6 (ref 5.0–8.0)

## 2021-10-25 LAB — CBC
HCT: 41.6 % (ref 39.0–52.0)
Hemoglobin: 12.8 g/dL — ABNORMAL LOW (ref 13.0–17.0)
MCH: 26.3 pg (ref 26.0–34.0)
MCHC: 30.8 g/dL (ref 30.0–36.0)
MCV: 85.4 fL (ref 80.0–100.0)
Platelets: 290 10*3/uL (ref 150–400)
RBC: 4.87 MIL/uL (ref 4.22–5.81)
RDW: 12.7 % (ref 11.5–15.5)
WBC: 10.6 10*3/uL — ABNORMAL HIGH (ref 4.0–10.5)
nRBC: 0 % (ref 0.0–0.2)

## 2021-10-25 LAB — TROPONIN I (HIGH SENSITIVITY)
Troponin I (High Sensitivity): 16 ng/L (ref <18)
Troponin I (High Sensitivity): 477 ng/L (ref <18)
Troponin I (High Sensitivity): 1406 ng/L (ref <18)
Troponin I (High Sensitivity): 1437 ng/L (ref <18)

## 2021-10-25 LAB — BASIC METABOLIC PANEL
Anion gap: 5 (ref 5–15)
BUN: 19 mg/dL (ref 6–20)
CO2: 25 mmol/L (ref 22–32)
Calcium: 7.9 mg/dL — ABNORMAL LOW (ref 8.9–10.3)
Chloride: 110 mmol/L (ref 98–111)
Creatinine, Ser: 0.84 mg/dL (ref 0.61–1.24)
GFR, Estimated: >60 mL/min (ref >60)
Glucose, Bld: 104 mg/dL — ABNORMAL HIGH (ref 70–99)
Potassium: 3.9 mmol/L (ref 3.5–5.1)
Sodium: 140 mmol/L (ref 135–145)

## 2021-10-25 LAB — PROTIME-INR
INR: 1 (ref 0.8–1.2)
Prothrombin Time: 12.9 seconds (ref 11.4–15.2)

## 2021-10-25 LAB — HIV ANTIBODY (ROUTINE TESTING W REFLEX): HIV Screen 4th Generation wRfx: NONREACTIVE

## 2021-10-25 LAB — HEPATIC FUNCTION PANEL
ALT: 99 U/L — ABNORMAL HIGH (ref 0–44)
AST: 193 U/L — ABNORMAL HIGH (ref 15–41)
Albumin: 3.2 g/dL — ABNORMAL LOW (ref 3.5–5.0)
Alkaline Phosphatase: 66 U/L (ref 38–126)
Bilirubin, Direct: 0.1 mg/dL (ref 0.0–0.2)
Indirect Bilirubin: 0.3 mg/dL (ref 0.3–0.9)
Total Bilirubin: 0.4 mg/dL (ref 0.3–1.2)
Total Protein: 6.1 g/dL — ABNORMAL LOW (ref 6.5–8.1)

## 2021-10-25 LAB — HEMOGLOBIN A1C
Hgb A1c MFr Bld: 5.5 % (ref 4.8–5.6)
Mean Plasma Glucose: 111.15 mg/dL

## 2021-10-25 LAB — LACTIC ACID, PLASMA
Lactic Acid, Venous: 3.7 mmol/L (ref 0.5–1.9)
Lactic Acid, Venous: 2.9 mmol/L (ref 0.5–1.9)
Lactic Acid, Venous: 1.7 mmol/L (ref 0.5–1.9)
Lactic Acid, Venous: 1.3 mmol/L (ref 0.5–1.9)

## 2021-10-25 LAB — APTT: aPTT: 29 seconds (ref 24–36)

## 2021-10-25 LAB — CBG MONITORING, ED: Glucose-Capillary: 208 mg/dL — ABNORMAL HIGH (ref 70–99)

## 2021-10-25 LAB — BRAIN NATRIURETIC PEPTIDE: B Natriuretic Peptide: 25.5 pg/mL (ref 0.0–100.0)

## 2021-10-25 LAB — HEPARIN LEVEL (UNFRACTIONATED): Heparin Unfractionated: 0.55 IU/mL (ref 0.30–0.70)

## 2021-10-25 LAB — PROCALCITONIN: Procalcitonin: <0.1 ng/mL

## 2021-10-25 LAB — MRSA NEXT GEN BY PCR, NASAL: MRSA by PCR Next Gen: NOT DETECTED

## 2021-10-25 LAB — PHOSPHORUS: Phosphorus: 3.3 mg/dL (ref 2.5–4.6)

## 2021-10-25 LAB — ETHANOL: Alcohol, Ethyl (B): <10 mg/dL (ref <10)

## 2021-10-25 MED ORDER — CHLORHEXIDINE GLUCONATE CLOTH 2 % EX PADS
6.0000 | MEDICATED_PAD | Freq: Every day | CUTANEOUS | Status: DC
  Administered 2021-10-25 – 2021-10-31 (×7): 6 via TOPICAL
  Filled 2021-10-25: qty 6

## 2021-10-25 MED ORDER — BUSPIRONE HCL 10 MG PO TABS: 30.0000 mg | ORAL_TABLET | Freq: Three times a day (TID) | ORAL | Status: DC | PRN

## 2021-10-25 MED ORDER — ONDANSETRON HCL 4 MG/2ML IJ SOLN
4.0000 mg | Freq: Four times a day (QID) | INTRAMUSCULAR | Status: DC | PRN
  Administered 2021-10-27 – 2021-10-30 (×3): 4 mg via INTRAVENOUS
  Filled 2021-10-25 (×2): qty 2

## 2021-10-25 MED ORDER — FENTANYL 2500MCG IN NS 250ML (10MCG/ML) PREMIX INFUSION
50.0000 ug/h | INTRAVENOUS | Status: DC
  Administered 2021-10-25: 50 ug/h via INTRAVENOUS
  Administered 2021-10-26 – 2021-10-27 (×3): 150 ug/h via INTRAVENOUS
  Filled 2021-10-25 (×4): qty 250

## 2021-10-25 MED ORDER — VITAL AF 1.2 CAL PO LIQD
1000.0000 mL | ORAL | Status: DC
  Administered 2021-10-25 – 2021-10-26 (×2): 1000 mL

## 2021-10-25 MED ORDER — PROPOFOL 1000 MG/100ML IV EMUL: 5.0000 ug/kg/min | INTRAVENOUS | Status: DC

## 2021-10-25 MED ORDER — LORAZEPAM 2 MG/ML IJ SOLN
INTRAMUSCULAR | Status: AC
  Administered 2021-10-25: 4 mg via INTRAVENOUS
  Filled 2021-10-25: qty 1

## 2021-10-25 MED ORDER — LACTATED RINGERS IV BOLUS
1000.0000 mL | Freq: Once | INTRAVENOUS | Status: AC
  Administered 2021-10-25: 1000 mL via INTRAVENOUS

## 2021-10-25 MED ORDER — ACETAMINOPHEN 160 MG/5ML PO SOLN: 650.0000 mg | ORAL | Status: DC

## 2021-10-25 MED ORDER — ACETAMINOPHEN 650 MG RE SUPP: 650.0000 mg | RECTAL | Status: DC

## 2021-10-25 MED ORDER — INSULIN ASPART 100 UNIT/ML IJ SOLN
0.0000 [IU] | INTRAMUSCULAR | Status: DC
  Administered 2021-10-26 (×2): 1 [IU] via SUBCUTANEOUS
  Filled 2021-10-25 (×2): qty 1

## 2021-10-25 MED ORDER — FREE WATER
30.0000 mL | Status: DC
  Administered 2021-10-25 – 2021-10-27 (×11): 30 mL

## 2021-10-25 MED ORDER — HEPARIN SODIUM (PORCINE) 5000 UNIT/ML IJ SOLN
5000.0000 [IU] | Freq: Three times a day (TID) | INTRAMUSCULAR | Status: DC
  Administered 2021-10-25: 5000 [IU] via SUBCUTANEOUS
  Filled 2021-10-25: qty 1

## 2021-10-25 MED ORDER — PANTOPRAZOLE 2 MG/ML SUSPENSION
40.0000 mg | Freq: Every day | ORAL | Status: DC
  Administered 2021-10-25 – 2021-10-26 (×2): 40 mg
  Filled 2021-10-25 (×2): qty 20

## 2021-10-25 MED ORDER — FENTANYL CITRATE PF 50 MCG/ML IJ SOSY
100.0000 ug | PREFILLED_SYRINGE | Freq: Once | INTRAMUSCULAR | Status: AC
  Administered 2021-10-25: 100 ug via INTRAVENOUS
  Filled 2021-10-25: qty 2

## 2021-10-25 MED ORDER — HEPARIN (PORCINE) 25000 UT/250ML-% IV SOLN
1700.0000 [IU]/h | INTRAVENOUS | Status: DC
Start: 2021-10-25 — End: 2021-10-28
  Administered 2021-10-25 – 2021-10-26 (×2): 1000 [IU]/h via INTRAVENOUS
  Filled 2021-10-25 (×4): qty 250

## 2021-10-25 MED ORDER — ACETAMINOPHEN 160 MG/5ML PO SOLN
650.0000 mg | ORAL | Status: DC | PRN
Start: 2021-10-26 — End: 2021-10-25

## 2021-10-25 MED ORDER — LORAZEPAM 2 MG/ML IJ SOLN: 4.0000 mg | Freq: Once | INTRAMUSCULAR | Status: AC

## 2021-10-25 MED ORDER — PROPOFOL 1000 MG/100ML IV EMUL
0.0000 ug/kg/min | INTRAVENOUS | Status: DC
  Administered 2021-10-25: 50 ug/kg/min via INTRAVENOUS
  Administered 2021-10-25: 15 ug/kg/min via INTRAVENOUS
  Administered 2021-10-25: 40 ug/kg/min via INTRAVENOUS
  Administered 2021-10-25: 25 ug/kg/min via INTRAVENOUS
  Administered 2021-10-25: 15 ug/kg/min via INTRAVENOUS
  Administered 2021-10-26 – 2021-10-27 (×8): 50 ug/kg/min via INTRAVENOUS
  Administered 2021-10-27: 45 ug/kg/min via INTRAVENOUS
  Filled 2021-10-25 (×13): qty 100

## 2021-10-25 MED ORDER — ACETAMINOPHEN 325 MG PO TABS
650.0000 mg | ORAL_TABLET | ORAL | Status: DC | PRN
Start: 2021-10-26 — End: 2021-10-25

## 2021-10-25 MED ORDER — LORAZEPAM 2 MG/ML IJ SOLN
INTRAMUSCULAR | Status: AC
  Administered 2021-10-25: 4 mg via INTRAMUSCULAR
  Filled 2021-10-25: qty 1

## 2021-10-25 MED ORDER — MAGNESIUM SULFATE 2 GM/50ML IV SOLN: 2.0000 g | Freq: Once | INTRAVENOUS | Status: AC | PRN

## 2021-10-25 MED ORDER — POTASSIUM CHLORIDE 10 MEQ/100ML IV SOLN
10.0000 meq | INTRAVENOUS | Status: AC
  Administered 2021-10-25 (×4): 10 meq via INTRAVENOUS
  Filled 2021-10-25 (×3): qty 100

## 2021-10-25 MED ORDER — FENTANYL 2500MCG IN NS 250ML (10MCG/ML) PREMIX INFUSION
INTRAVENOUS | Status: AC
  Administered 2021-10-25: 10 ug/h via INTRAVENOUS
  Filled 2021-10-25: qty 250

## 2021-10-25 MED ORDER — FENTANYL 2500MCG IN NS 250ML (10MCG/ML) PREMIX INFUSION: 0.0000 ug/h | INTRAVENOUS | Status: DC

## 2021-10-25 MED ORDER — LACTATED RINGERS IV SOLN: INTRAVENOUS | Status: DC

## 2021-10-25 MED ORDER — VECURONIUM BROMIDE 10 MG IV SOLR
20.0000 mg | Freq: Once | INTRAVENOUS | Status: AC
Start: 2021-10-25 — End: 2021-10-25

## 2021-10-25 MED ORDER — PROPOFOL 1000 MG/100ML IV EMUL
INTRAVENOUS | Status: AC
  Administered 2021-10-25: 5 ug/kg/min via INTRAVENOUS
  Filled 2021-10-25: qty 100

## 2021-10-25 MED ORDER — ASPIRIN 81 MG PO CHEW
81.0000 mg | CHEWABLE_TABLET | Freq: Every day | ORAL | Status: DC
  Administered 2021-10-26: 81 mg
  Filled 2021-10-25: qty 1

## 2021-10-25 MED ORDER — FENTANYL BOLUS VIA INFUSION: 50.0000 ug | INTRAVENOUS | Status: DC | PRN

## 2021-10-25 MED ORDER — SODIUM CHLORIDE 0.9% FLUSH: 10.0000 mL | INTRAVENOUS | Status: DC | PRN

## 2021-10-25 MED ORDER — ATORVASTATIN CALCIUM 20 MG PO TABS
80.0000 mg | ORAL_TABLET | Freq: Every day | ORAL | Status: DC
  Administered 2021-10-25 – 2021-10-26 (×2): 80 mg
  Filled 2021-10-25 (×2): qty 4

## 2021-10-25 MED ORDER — DOCUSATE SODIUM 50 MG/5ML PO LIQD
100.0000 mg | Freq: Two times a day (BID) | ORAL | Status: DC
  Administered 2021-10-25 – 2021-10-26 (×3): 100 mg
  Filled 2021-10-25 (×3): qty 10

## 2021-10-25 MED ORDER — FENTANYL CITRATE (PF) 100 MCG/2ML IJ SOLN: 200.0000 ug | Freq: Once | INTRAMUSCULAR | Status: AC

## 2021-10-25 MED ORDER — HEPARIN BOLUS VIA INFUSION
4000.0000 [IU] | Freq: Once | INTRAVENOUS | Status: AC
  Administered 2021-10-25: 4000 [IU] via INTRAVENOUS
  Filled 2021-10-25: qty 4000

## 2021-10-25 MED ORDER — LORAZEPAM 2 MG/ML IJ SOLN
INTRAMUSCULAR | Status: AC
  Administered 2021-10-25: 2 mg
  Filled 2021-10-25: qty 2

## 2021-10-25 MED ORDER — MIDAZOLAM HCL 2 MG/2ML IJ SOLN
2.0000 mg | INTRAMUSCULAR | Status: DC | PRN
  Administered 2021-10-25 – 2021-10-27 (×6): 2 mg via INTRAVENOUS
  Filled 2021-10-25 (×5): qty 2

## 2021-10-25 MED ORDER — VECURONIUM BROMIDE 10 MG IV SOLR
INTRAVENOUS | Status: AC
  Administered 2021-10-25: 20 mg via INTRAVENOUS
  Filled 2021-10-25: qty 20

## 2021-10-25 MED ORDER — FENTANYL CITRATE (PF) 100 MCG/2ML IJ SOLN
INTRAMUSCULAR | Status: AC
  Administered 2021-10-25: 200 ug via INTRAVENOUS
  Filled 2021-10-25: qty 4

## 2021-10-25 MED ORDER — IOHEXOL 350 MG/ML SOLN
100.0000 mL | Freq: Once | INTRAVENOUS | Status: AC | PRN
  Administered 2021-10-25: 100 mL via INTRAVENOUS

## 2021-10-25 MED ORDER — LEVETIRACETAM IN NACL 500 MG/100ML IV SOLN
500.0000 mg | Freq: Two times a day (BID) | INTRAVENOUS | Status: DC
  Administered 2021-10-25 – 2021-10-26 (×4): 500 mg via INTRAVENOUS
  Filled 2021-10-25 (×5): qty 100

## 2021-10-25 MED ORDER — ACETAMINOPHEN 325 MG RE SUPP: 650.0000 mg | RECTAL | Status: DC | PRN

## 2021-10-25 MED ORDER — MIDAZOLAM HCL 2 MG/2ML IJ SOLN
2.0000 mg | INTRAMUSCULAR | Status: DC | PRN
  Administered 2021-10-25 (×2): 2 mg via INTRAVENOUS
  Filled 2021-10-25 (×3): qty 2

## 2021-10-25 MED ORDER — ASPIRIN 81 MG PO CHEW
324.0000 mg | CHEWABLE_TABLET | Freq: Once | ORAL | Status: AC
  Administered 2021-10-25: 324 mg
  Filled 2021-10-25: qty 4

## 2021-10-25 MED ORDER — POLYETHYLENE GLYCOL 3350 17 G PO PACK
17.0000 g | PACK | Freq: Every day | ORAL | Status: DC
  Administered 2021-10-25 – 2021-10-26 (×2): 17 g
  Filled 2021-10-25 (×2): qty 1

## 2021-10-25 MED ORDER — SODIUM CHLORIDE 0.9% FLUSH
10.0000 mL | Freq: Two times a day (BID) | INTRAVENOUS | Status: DC
  Administered 2021-10-26 – 2021-10-29 (×9): 10 mL

## 2021-10-25 MED ORDER — ACETAMINOPHEN 325 MG PO TABS
650.0000 mg | ORAL_TABLET | ORAL | Status: DC
  Administered 2021-10-25 – 2021-10-26 (×9): 650 mg via ORAL
  Filled 2021-10-25 (×9): qty 2

## 2021-10-25 MED ORDER — ZIPRASIDONE MESYLATE 20 MG IM SOLR
20.0000 mg | Freq: Once | INTRAMUSCULAR | Status: DC
  Filled 2021-10-25: qty 20

## 2021-10-25 MED ORDER — FENTANYL CITRATE PF 50 MCG/ML IJ SOSY: 50.0000 ug | PREFILLED_SYRINGE | Freq: Once | INTRAMUSCULAR | Status: DC

## 2021-10-25 NOTE — ED Notes (Addendum)
30 Etomidate @0055  12 Succ @0056   ETT placed by Dr. Leonides Schanz - secure 25 @ the lip.

## 2021-10-25 NOTE — ED Provider Notes (Signed)
[facility] Provider Note    Event Date/Time   First MD Initiated Contact with Patient 10/25/21 0105     (approximate)   History   Cardiac Arrest   HPI  Blake Moore is a 51 y.o. male with no significant past medical history who presents to the emergency department with EMS after a postcardiac arrest.  History is provided by patient and significant other Brooke and EMS.  Patient's significant other states that patient was complaining of chest pain last week and was seen in the emergency department.  He had been in his normal state of health today.  She states that he was sleeping and she came to bed after putting her son down and he "jumped out of bed" and was "incoherent".  She states he went to the kitchen and began stumbling and fell forward and then hit the ground.  She states he was not breathing and started CPR immediately.  EMS reports that the Police Department arrived within 1 to 2 minutes of the EMS call and applied an AED and administered 2 shocks.  On EMS arrival patient was in a V-fib arrest and they administered another 2 rounds of defibrillation.  He had a proximal downtime of 10 to 12 minutes and they obtained ROSC.  He was not given any medications.  They did briefly have a King airway in place but patient began breathing on his own and they removed the [facility] airway.  He has not been answering questions or following commands.  Blood sugar 229.  No known cardiac history.  Significant other denies history of hypertension, diabetes, hyperlipidemia or tobacco use.  She states she thinks there is a family history of cardiac disease but is not sure.  EMS was concerned about a STEMI on their EKG and activated a code STEMI in route.   History provided by significant other and EMS.    History reviewed. No pertinent past medical history.  History reviewed. No pertinent surgical history.  MEDICATIONS:  Prior to Admission medications   Medication Sig Start  Date End Date Taking? Authorizing Provider  albuterol (VENTOLIN HFA) 108 (90 Base) MCG/ACT inhaler Inhale 2 puffs into the lungs every 6 (six) hours as needed for wheezing or shortness of breath. 05/27/19   Willy Eddy, MD    Physical Exam   Triage Vital Signs: ED Triage Vitals  Enc Vitals Group     BP 10/25/21 0055 114/82     Pulse Rate 10/25/21 0055 75     Resp 10/25/21 0055 20     Temp 10/25/21 0109 (!) 96 F (35.6 C)     Temp Source 10/25/21 0109 Bladder     SpO2 10/25/21 0055 100 %     Weight 10/25/21 0049 187 lb 8 oz (85 kg)     Height 10/25/21 0049 6\' 1"  (1.854 m)     Head Circumference --      Peak Flow --      Pain Score --      Pain Loc --      Pain Edu? --      Excl. in GC? --     Most recent vital signs: Vitals:   10/25/21 0300 10/25/21 0310  BP: 107/74 108/67  Pulse: 89 91  Resp: (!) 26   Temp:  (!) 97 F (36.1 C)  SpO2: 100% 100%     CONSTITUTIONAL: GCS 3 HEAD: Normocephalic; atraumatic EYES: Conjunctivae clear, pupils approximately 3 to 4 mm and sluggishly reactive  ENT: normal nose; no rhinorrhea; moist mucous membranes; pharynx without lesions noted; no dental injury; no septal hematoma, no epistaxis NECK: No step-off or deformity, trachea midline CARD: RRR; S1 and S2 appreciated; no murmurs, no clicks, no rubs, no gallops RESP: Patient is breathing on his own approximately 14-16 times a minute.  Breath sounds are clear and equal bilaterally without rhonchi, wheezing or rales. CHEST:  chest wall stable, no crepitus or ecchymosis or deformity ABD/GI: Nondistended abdomen PELVIS:  stable BACK:  The back appears normal; no step-off or deformity EXT: No calf tenderness or calf swelling, 2+ radial and DP pulses bilaterally, no cyanosis SKIN: Normal color for age and race; warm NEURO: GCS 3  ED Results / Procedures / Treatments   LABS: (all labs ordered are listed, but only abnormal results are displayed) Labs Reviewed  CBC WITH  DIFFERENTIAL/PLATELET - Abnormal; Notable for the following components:      Result Value   Lymphs Abs 4.5 (*)    Abs Immature Granulocytes 0.11 (*)    All other components within normal limits  COMPREHENSIVE METABOLIC PANEL - Abnormal; Notable for the following components:   Potassium 2.7 (*)    CO2 19 (*)    Glucose, Bld 242 (*)    Calcium 8.3 (*)    AST 126 (*)    ALT 101 (*)    All other components within normal limits  URINALYSIS, ROUTINE W REFLEX MICROSCOPIC - Abnormal; Notable for the following components:   Color, Urine YELLOW (*)    APPearance HAZY (*)    Glucose, UA 50 (*)    Protein, ur 100 (*)    Bacteria, UA FEW (*)    All other components within normal limits  URINE DRUG SCREEN, QUALITATIVE ([facility] ONLY) - Abnormal; Notable for the following components:   Cannabinoid 50 Ng, Ur [place] POSITIVE (*)    All other components within normal limits  BLOOD GAS, ARTERIAL - Abnormal; Notable for the following components:   pO2, Arterial 151 (*)    Acid-base deficit 4.2 (*)    All other components within normal limits  CBG MONITORING, ED - Abnormal; Notable for the following components:   Glucose-Capillary 208 (*)    All other components within normal limits  CULTURE, BLOOD (ROUTINE X 2)  CULTURE, BLOOD (ROUTINE X 2)  URINE CULTURE  BRAIN NATRIURETIC PEPTIDE  MAGNESIUM  ETHANOL  APTT  PROTIME-INR  PROCALCITONIN  LACTIC ACID, PLASMA  LACTIC ACID, PLASMA  TROPONIN I (HIGH SENSITIVITY)  TROPONIN I (HIGH SENSITIVITY)     EKG:  EKG Interpretation  Date/Time:  Friday October 25 2021 00:45:59 EDT Ventricular Rate:  77 PR Interval:  231 QRS Duration: 100 QT Interval:  413 QTC Calculation: 468 R Axis:   45 Text Interpretation: Sinus rhythm Ventricular premature complex Prolonged PR interval Consider left atrial enlargement RSR' in V1 or V2, probably normal variant ST elev, probable normal early repol pattern Baseline wander in lead(s) I III aVL V3 V4 V5 Confirmed by Rochele Raring [phone]) on 10/25/2021 3:06:32 AM         EKG Interpretation  Date/Time:  Friday October 25 2021 02:32:41 EDT Ventricular Rate:  72 PR Interval:  197 QRS Duration: 111 QT Interval:  425 QTC Calculation: 466 R Axis:   59 Text Interpretation: Sinus rhythm Atrial premature complexes Confirmed by Rochele Raring [phone]) on 10/25/2021 3:06:38 AM         RADIOLOGY: My personal review and interpretation of imaging: Chest x-ray shows no edema  or pneumothorax.  CT scan showed no traumatic injury, PE or dissection.  I have personally reviewed all radiology reports. CT Cervical Spine Wo Contrast  Result Date: 10/25/2021 CLINICAL DATA:  Status post trauma. EXAM: CT CERVICAL SPINE WITHOUT CONTRAST TECHNIQUE: Multidetector CT imaging of the cervical spine was performed without intravenous contrast. Multiplanar CT image reconstructions were also generated. RADIATION DOSE REDUCTION: This exam was performed according to the departmental dose-optimization program which includes automated exposure control, adjustment of the mA and/or kV according to patient size and/or use of iterative reconstruction technique. COMPARISON:  None Available. FINDINGS: Alignment: There is mild reversal of the normal cervical spine lordosis. Skull base and vertebrae: No acute fracture. No primary bone lesion or focal pathologic process. Soft tissues and spinal canal: No prevertebral fluid or swelling. No visible canal hematoma. Disc levels: Moderate severity endplate sclerosis and anterior osteophyte formation are seen at the levels of C4-C5, C5-C6 and C6-C7. Mild anterior osteophyte formation is also seen at the level of C7-T1. Moderate to marked severity intervertebral disc space narrowing is seen at C4-C5, C5-C6 and C6-C7. Normal, bilateral multilevel facet joints are noted. Upper chest: Negative. Other: Endotracheal and nasogastric tubes are in place. IMPRESSION: 1. No acute fracture or subluxation in the cervical spine. 2.  Moderate to marked severity degenerative changes at the levels of C4-C5, C5-C6 and C6-C7. Electronically Signed   By: Aram Candela M.D.   On: 10/25/2021 03:17   CT ABDOMEN PELVIS W CONTRAST  Result Date: 10/25/2021 CLINICAL DATA:  Status post cardiac arrest. EXAM: CT ABDOMEN AND PELVIS WITH CONTRAST TECHNIQUE: Multidetector CT imaging of the abdomen and pelvis was performed using the standard protocol following bolus administration of intravenous contrast. RADIATION DOSE REDUCTION: This exam was performed according to the departmental dose-optimization program which includes automated exposure control, adjustment of the mA and/or kV according to patient size and/or use of iterative reconstruction technique. CONTRAST:  OMNIPAQUE IOHEXOL 350 MG/ML SOLN COMPARISON:  None Available. FINDINGS: It should be noted that the study is limited secondary to patient motion. Lower chest: Moderate severity atelectasis is seen within the posterior aspect of the bilateral lung bases. Hepatobiliary: No focal liver abnormality is seen. No gallstones, gallbladder wall thickening, or biliary dilatation. Pancreas: Unremarkable. No pancreatic ductal dilatation or surrounding inflammatory changes. Spleen: Normal in size without focal abnormality. Adrenals/Urinary Tract: Adrenal glands are unremarkable. Kidneys are normal, without renal calculi, focal lesion, or hydronephrosis. A Foley catheter is seen within the urinary bladder. Stomach/Bowel: A nasogastric tube is in place with its distal tip seen within the body of the stomach. Stomach is within normal limits. Appendix appears normal. No evidence of bowel wall thickening, distention, or inflammatory changes. Vascular/Lymphatic: No significant vascular findings are present. No enlarged abdominal or pelvic lymph nodes. Reproductive: Prostate is unremarkable. Other: No abdominal wall hernia or abnormality. No abdominopelvic ascites. Musculoskeletal: Marked severity  degenerative changes are seen within the lower lumbar spine at the level of L5-S1. IMPRESSION: 1. Moderate severity atelectasis within the posterior aspect of the bilateral lung bases. 2. No acute or active process within the abdomen or pelvis. 3. Marked severity degenerative changes within the lower lumbar spine at the level of L5-S1. Electronically Signed   By: Aram Candela M.D.   On: 10/25/2021 03:15   CT Angio Chest PE W and/or Wo Contrast  Result Date: 10/25/2021 CLINICAL DATA:  Status post cardiac arrest. EXAM: CT ANGIOGRAPHY CHEST WITH CONTRAST TECHNIQUE: Multidetector CT imaging of the chest was performed using the standard  protocol during bolus administration of intravenous contrast. Multiplanar CT image reconstructions and MIPs were obtained to evaluate the vascular anatomy. RADIATION DOSE REDUCTION: This exam was performed according to the departmental dose-optimization program which includes automated exposure control, adjustment of the mA and/or kV according to patient size and/or use of iterative reconstruction technique. CONTRAST:  OMNIPAQUE IOHEXOL 350 MG/ML SOLN COMPARISON:  None Available. FINDINGS: Cardiovascular: Satisfactory opacification of the pulmonary arteries to the segmental level. No evidence of pulmonary embolism. Normal heart size. No pericardial effusion. Mediastinum/Nodes: Endotracheal and nasogastric tubes are in place. No enlarged mediastinal, hilar, or axillary lymph nodes. Thyroid gland, trachea, and esophagus demonstrate no significant findings. Lungs/Pleura: Mild to moderate severity areas of atelectasis are seen within the posterior aspect of the bilateral lower lobes. There is no evidence of a pleural effusion pneumothorax. Upper Abdomen: No acute abnormality. Musculoskeletal: No chest wall abnormality. No acute or significant osseous findings. Review of the MIP images confirms the above findings. IMPRESSION: 1. No evidence of pulmonary embolism. 2. Mild to  moderate severity posterior bilateral lower lobe atelectasis. Electronically Signed   By: Aram Candela M.D.   On: 10/25/2021 03:12   CT HEAD WO CONTRAST ( )  Result Date: 10/25/2021 CLINICAL DATA:  Status post cardiac arrest. EXAM: CT HEAD WITHOUT CONTRAST TECHNIQUE: Contiguous axial images were obtained from the base of the skull through the vertex without intravenous contrast. RADIATION DOSE REDUCTION: This exam was performed according to the departmental dose-optimization program which includes automated exposure control, adjustment of the mA and/or kV according to patient size and/or use of iterative reconstruction technique. COMPARISON:  June 27, 2017 FINDINGS: Brain: No evidence of acute infarction, hemorrhage, hydrocephalus, extra-axial collection or mass lesion/mass effect. Vascular: No hyperdense vessel or unexpected calcification. Skull: Normal. Negative for fracture or focal lesion. Sinuses/Orbits: No acute finding. Other: None. IMPRESSION: No acute intracranial abnormality. Electronically Signed   By: Aram Candela M.D.   On: 10/25/2021 03:09   DG Chest Portable 1 View  Result Date: 10/25/2021 CLINICAL DATA:  Intubated EXAM: PORTABLE CHEST 1 VIEW COMPARISON:  10/10/2021 FINDINGS: Endotracheal tube tip about 3.8 cm superior to the carina. No pleural effusion. Streaky pulmonary opacities in the upper lobes and left base. Normal cardiac size. No pneumothorax IMPRESSION: 1. Endotracheal tube tip about 3.8 cm superior to the carina 2. Question streaky infiltrates in the upper lobes and left base Electronically Signed   By: Jasmine Pang M.D.   On: 10/25/2021 01:17     PROCEDURES:  Critical Care performed: Yes, see critical care procedure note(s)   CRITICAL CARE Performed by: Baxter Hire Eliana Lueth   Total critical care time: 65 minutes  Critical care time was exclusive of separately billable procedures and treating other patients.  Critical care was necessary to treat or prevent  imminent or life-threatening deterioration.  Critical care was time spent personally by me on the following activities: development of treatment plan with patient and/or surrogate as well as nursing, discussions with consultants, evaluation of patient's response to treatment, examination of patient, obtaining history from patient or surrogate, ordering and performing treatments and interventions, ordering and review of laboratory studies, ordering and review of radiographic studies, pulse oximetry and re-evaluation of patient's condition.   Marland Kitchen1-3 Lead EKG Interpretation  Performed by: Olamide Carattini, Layla Maw, DO Authorized by: Lannette Avellino, Layla Maw, DO     Interpretation: normal     ECG rate:  75   ECG rate assessment: normal     Rhythm: sinus rhythm  Ectopy: PAC     Conduction: normal    INTUBATION Performed by: Baxter Hire Lumi Winslett  Required items: required blood products, implants, devices, and special equipment available Patient identity confirmed: provided demographic data and hospital-assigned identification number Time out: Immediately prior to procedure a "time out" was called to verify the correct patient, procedure, equipment, support staff and site/side marked as required.  Indications: Cardiac arrest  Intubation method: Glidescope Laryngoscopy   Preoxygenation: BVM  Sedatives: 30 mg Etomidate Paralytic: 120 mg Succinylcholine  Tube Size: 7.5 cuffed  Post-procedure assessment: chest rise and ETCO2 monitor Breath sounds: equal and absent over the epigastrium Tube secured with: ETT holder Chest x-ray interpreted by radiologist and me.  Chest x-ray findings: endotracheal tube in appropriate position  Patient tolerated the procedure well with no immediate complications.     IMPRESSION / MDM / ASSESSMENT AND PLAN / ED COURSE  I reviewed the triage vital signs and the nursing notes.  Patient here after witnessed cardiac arrest.  Patient was in ventricular fibrillation and received 4  rounds of defibrillation and now has ROSC.  Approximately 10-minute downtime.  The patient is on the cardiac monitor to evaluate for evidence of arrhythmia and/or significant heart rate changes.   DIFFERENTIAL DIAGNOSIS (includes but not limited to):   ACS, PE, dissection, electrolyte derangement, anemia, intracranial hemorrhage, infectious etiology  Patient's presentation is most consistent with acute presentation with potential threat to life or bodily function.  PLAN: We will obtain CBC, CMP, troponin, BNP, magnesium, blood cultures, lactic, urinalysis, ethanol, urine drug screen, chest x-ray, pan CT scan.  Dr. Herbie Baltimore with cardiology at bedside given code STEMI was activated in route.  He has reviewed patient's EKG.  No STEMI at this time and no indication to take patient emergently to the Cath Lab.  Work-up for other causes of his V-fib arrest.  Appreciate cardiology help.  Patient breathing on his own but GCS of 3.  Intubated for airway protection.  No sign of traumatic injury on exam.  Patient will need ICU admission.   MEDICATIONS GIVEN IN ED: Medications  propofol (DIPRIVAN) 1000 MG/100ML infusion (15 mcg/kg/min  85 kg Intravenous Rate/Dose Change 10/25/21 0202)  potassium chloride 10 mEq in 100 mL IVPB (10 mEq Intravenous New Bag/Given 10/25/21 0245)  fentaNYL in NS (59mcg/ml) infusion-PREMIX (10 mcg/hr Intravenous New Bag/Given 10/25/21 0243)  lactated ringers bolus 1,000 mL (0 mLs Intravenous Stopped 10/25/21 0315)  fentaNYL (SUBLIMAZE) injection 100 mcg (100 mcg Intravenous Given by Other 10/25/21 0230)  iohexol (OMNIPAQUE) 350 MG/ML injection 100 mL (100 mLs Intravenous Contrast Given 10/25/21 0239)     ED COURSE: Labs show no leukocytosis, normal troponin and BNP.  Potassium level 2.7.  Magnesium level normal.  Repeat EKG show PACs.  No ST elevation.  Chest x-ray reviewed/interpreted by myself and radiologist and shows no edema, pneumothorax but does show streaky opacities  which could represent infection.  His significant other denies any recent infectious symptoms occluding fevers, cough, vomiting or diarrhea.  CT scans pending.   CT scans reviewed/interpreted by myself and radiologist and show no PE, dissection, traumatic injury.  No infiltrate or edema.  Will discuss with critical care for admission.  Patient being sedated with propofol and fentanyl infusions.  Has remained hemodynamically stable and not requiring any pressors.  No further cardiac events noted on cardiac monitoring.     Plan initially was to cool the patient but he became agitated and started moving his upper extremities and fighting against the ventilator  and required increasing sedation.  Giving improving neurologic status, will hold on cooling patient at this time.   CONSULTS:  Consulted and discussed patient's case with intesivist, Dr. Katrinka Blazing.  I have recommended admission and consulting physician agrees and will place admission orders.  Patient (and family if present) agree with this plan.   I reviewed all nursing notes, vitals, pertinent previous records.  All labs, EKGs, imaging ordered have been independently reviewed and interpreted by myself.    OUTSIDE RECORDS REVIEWED: Reviewed patient's primary care note on 10/10/2021 where patient presented to [facility] clinic with chest pain and was sent to the ED for further evaluation and had a negative troponin in the ED.       FINAL CLINICAL IMPRESSION(S) / ED DIAGNOSES   Final diagnoses:  Cardiac arrest with ventricular fibrillation (HCC)  Hypokalemia     Rx / DC Orders   ED Discharge Orders     None        Note:  This document was prepared using Dragon voice recognition software and may include unintentional dictation errors.   Antanette Richwine, Layla Maw, DO 10/25/21 [phone]

## 2021-10-25 NOTE — Progress Notes (Signed)
Pt. Became very agitated, with rhythmic movement and no purposeful eye contact or response. Sedation increased.

## 2021-10-25 NOTE — ED Triage Notes (Signed)
Pt presents via EMS for Cardiac arrest. Pt received CPR by PD on scene - down time ~10 mins & received 4 defbs (360) PTA by Ems due to vfib HR. No meds given PTA. Per family, pt endorsed CP earlier today.   CBG -229

## 2021-10-25 NOTE — Progress Notes (Signed)
Pt. Awakened from sedation and attempted to remove ETT tube, and began thrashing in bed. Pt. Is very strong and required multiple staff to restrain him. Due to this he was able to remove 3 of his 4 PIV's which limited the ability to properly sedate him. We were able to administer IM medication which allowed time to give follow up meds. Unfortunately pt. had to be restrained for his safety. We were able to insert a line and get pt. properly sedated and comfortable.

## 2021-10-25 NOTE — Consult Note (Signed)
ANTICOAGULATION CONSULT NOTE  Pharmacy Consult for IV Heparin Indication: chest pain/ACS  Patient Measurements: Height: 6\' 1"  (185.4 cm) Weight: 85 kg (187 lb 6.3 oz) IBW/kg (Calculated) : 79.9 Heparin Dosing Weight: 85 kg  Labs: Recent Labs    10/25/21 0049 10/25/21 0318 10/25/21 0615 10/25/21 0920  HGB 13.3  --  12.8* 11.9*  HCT 43.7  --  41.6 37.7*  PLT 364  --  290 275  APTT 29  --   --   --   LABPROT 12.9  --   --   --   INR 1.0  --   --   --   CREATININE 1.09  --   --  0.84  TROPONINIHS 16 477*  --  1,406*    Estimated Creatinine Clearance: 118.9 mL/min (by C-G formula based on SCr of 0.84 mg/dL).   Medical History: History reviewed. No pertinent past medical history.  Medications:  No anticoagulation prior to admission per my chart review  Assessment: Patient is a 51 y/o M with medical history including asthma who was BIBEMS after out of hospital cardiac arrest with ROSC after estimated downtime of 10-12 minutes. Rhythm Vfib upon EMS arrival s/p defibrillation x 2. Of note, patient was seen in the ED 5/25 with chest pain but given normal EKG, normal troponin, and no signs of acute distress, patient was discharged home. Pharmacy consulted to initiate and manage IV heparin for suspected ACS.  Baseline aPTT and PT-INR within normal limits. Baseline CBC overall normal.   Goal of Therapy:  Heparin level 0.3-0.7 units/ml Monitor platelets by anticoagulation protocol: Yes   Plan:  --Heparin 4000 unit IV bolus followed by continuous infusion at 1000 units/hr --HL 6 hours after initiation of infusion --Daily CBC per protocol while on IV heparin  Benita Gutter 10/25/2021,10:55 AM

## 2021-10-25 NOTE — Progress Notes (Signed)
ANTICOAGULATION CONSULT NOTE  Pharmacy Consult for IV Heparin Indication: chest pain/ACS  Patient Measurements: Height: 6\' 1"  (185.4 cm) Weight: 85 kg (187 lb 6.3 oz) IBW/kg (Calculated) : 79.9 Heparin Dosing Weight: 85 kg  Labs: Recent Labs    10/25/21 0049 10/25/21 0318 10/25/21 0615 10/25/21 0920 10/25/21 1115 10/25/21 1831  HGB 13.3  --  12.8* 11.9*  --   --   HCT 43.7  --  41.6 37.7*  --   --   PLT 364  --  290 275  --   --   APTT 29  --   --   --   --   --   LABPROT 12.9  --   --   --   --   --   INR 1.0  --   --   --   --   --   HEPARINUNFRC  --   --   --   --   --  0.55  CREATININE 1.09  --   --  0.84  --   --   TROPONINIHS 16 477*  --  1,406* 1,437*  --     Estimated Creatinine Clearance: 118.9 mL/min (by C-G formula based on SCr of 0.84 mg/dL).   Medical History: History reviewed. No pertinent past medical history.  Medications:  No anticoagulation prior to admission per my chart review  Assessment: Patient is a 51 y/o M with medical history including asthma who was BIBEMS after out of hospital cardiac arrest with ROSC after estimated downtime of 10-12 minutes. Rhythm Vfib upon EMS arrival s/p defibrillation x 2. Of note, patient was seen in the ED 5/25 with chest pain but given normal EKG, normal troponin, and no signs of acute distress, patient was discharged home. Pharmacy consulted to initiate and manage IV heparin for suspected ACS.  Baseline aPTT and PT-INR within normal limits. Baseline CBC overall normal.   Goal of Therapy:  Heparin level 0.3-0.7 units/ml Monitor platelets by anticoagulation protocol: Yes   Date/Time Level  Comment 6/9@1831  HL 0.55 Therapeutic x1  Plan:  Continuous infusion at 1000 units/hr Repeat HL in 6 hours to confirm Daily CBC per protocol while on IV heparin  Mahlon Gabrielle Rodriguez-Guzman PharmD, BCPS 10/25/2021 7:22 PM

## 2021-10-25 NOTE — Procedures (Signed)
Central Venous Catheter Insertion Procedure Note Yamen Castrogiovanni [id number] Sep 20, 1970  Procedure: Insertion of Central Venous Catheter Indications: Assessment of intravascular volume, Drug and/or fluid administration, and Frequent blood sampling  Procedure Details Consent: Unable to obtain consent because of emergent medical necessity. Time Out: Verified patient identification, verified procedure, site/side was marked, verified correct patient position, special equipment/implants available, medications/allergies/relevent history reviewed, required imaging and test results available.  Performed  Maximum sterile technique was used including antiseptics, cap, gloves, gown, hand hygiene, mask, and sheet. Skin prep: Chlorhexidine; local anesthetic administered A antimicrobial bonded/coated triple lumen catheter was placed in the left internal jugular vein using the Seldinger technique.  Evaluation Blood flow good Complications: No apparent complications Patient did tolerate procedure well. Chest X-ray ordered to verify placement.  CXR: pending.  Line secured at the 20 cm mark. BIOPATCH applied to the site.   Harlon Ditty, AGACNP-BC [facility] Prefer epic messenger for cross cover needs If after hours, please call E-link  Judithe Modest 10/25/2021, 2:42 PM

## 2021-10-25 NOTE — H&P (Signed)
NAME:  Blake Moore, MRN:  [id number], DOB:  [birth year], LOS: 0 ADMISSION DATE:  10/25/2021, CONSULTATION DATE:  10/25/21 REFERRING MD:  Dr Elesa Massed, CHIEF COMPLAINT:  [facility]CA   History of Present Illness:  Borrowing excellent history from Dr. Elesa Massed:  "Blake Moore is a 51 y.o. male with no significant past medical history who presents to the emergency department with EMS after a postcardiac arrest.  History is provided by patient and significant other Brooke and EMS.  Patient's significant other states that patient was complaining of chest pain last week and was seen in the emergency department.  He had been in his normal state of health today.  She states that he was sleeping and she came to bed after putting her son down and he "jumped out of bed" and was "incoherent".  She states he went to the kitchen and began stumbling and fell forward and then hit the ground.  She states he was not breathing and started CPR immediately.  EMS reports that the Police [employer] arrived within 1 to 2 minutes of the EMS call and applied an AED and administered 2 shocks.  On EMS arrival patient was in a V-fib arrest and they administered another 2 rounds of defibrillation.  He had a proximal downtime of 10 to 12 minutes and they obtained ROSC.  He was not given any medications.   They did briefly have a King airway in place but patient began breathing on his own and they removed the [facility] airway.  He has not been answering questions or following commands.  Blood sugar 229.  No known cardiac history.  Significant other denies history of hypertension, diabetes, hyperlipidemia or tobacco use.  She states she thinks there is a family history of cardiac disease but is not sure.  EMS was concerned about a STEMI on their EKG and activated a code STEMI in route."  In ER, intubated for GCS 3.  Repeat EKG more benign, code STEMI cancelled.  Patient pan-scanned without proximal cause for arrest.  Labs with marijuna urine, low K otherwise  benign.  Recent ER visit last week for left sided chest pain but trops neg.    PCCM asked to admit for post arrest care.  Pertinent  Medical History  ?Asthma: has albuterol at home  Significant Hospital Events: Including procedures, antibiotic start and stop dates in addition to other pertinent events   6/9 admitted  Interim History / Subjective:  Consulted  Objective   Blood pressure 108/67, pulse 91, temperature (!) 97 F (36.1 C), temperature source Bladder, resp. rate (!) 26, height 6\' 1"  (1.854 m), weight 85 kg, SpO2 100 %.    Vent Mode: AC FiO2 (%):  [60 %] 60 % Set Rate:  [18 bmp] 18 bmp Vt Set:  [500 mL] 500 mL PEEP:  [5 cmH20] 5 cmH20  No intake or output data in the 24 hours ending 10/25/21 0336 Filed Weights   10/25/21 0049  Weight: 85 kg    Examination: General: sedated on vent HENT: pupils pinpoint, small white secretions ETT Lungs: diminished bases, does not seem to be triggering vent Cardiovascular: RRR, ext warm, +ejection murmur Abdomen: soft, +BS Extremities: no edema Neuro: (with current sedation) decorticate posturing to pain, pupils pinpoint but reactive to light, doll's eyes and corneals present Skin: no rashes  Labs with low K, mild hyperglycemia, and lymphocytosis. CT C/A/P no PE, mild basilar atelectasis, DJD  Resolved Hospital Problem list   N/A  Assessment & Plan:  OHCA- preceding  bizarre behavior briefly.  10-12 min downtime.  Initial rhythm shockable.  Workup in ER showing low K.  EKG with PACs, no STEMI.  Recent ER visit for chest pain.  Overall seems like a primary arrhythmia perhaps induced by drug use and/or low K.  Underlying cardiomyopathy will need to be excluded.  Post arrest encephalopathy- hopefully will improve with time/supportive care given prompt CPR.  Basilar atelectasis on CT- Pct neg, main airways patent, would hold on abx for now and monitor.  Lymphocytosis on admit CBC- would repeat around noon w/ smear review to  assure this is real and not machine error.  - TTM2 with arctic sun PRN sustained temps > 37.6.  Sedating and antishivering measures per our TTM2 orderset - Lung protective tidal volumes limiting driving pressures to < 08MV H2O as able - VAP prevention bundle - Echo, EEG; consider cardiology consult in AM - Recheck K, CBC at noon (re-eval lymphocytosis and K repletion) - Significant other was updated by EDP  Best Practice (right click and "Reselect all SmartList Selections" daily)   Diet/type: NPO DVT prophylaxis: heparin subQ GI prophylaxis: PPI Lines: N/A Foley:  Yes, and it is still needed Code Status:  full code Last date of multidisciplinary goals of care discussion [pending]  Labs   CBC: Recent Labs  Lab 10/25/21 0049  WBC 9.2  NEUTROABS 3.3  HGB 13.3  HCT 43.7  MCV 86.5  PLT 364    Basic Metabolic Panel: Recent Labs  Lab 10/25/21 0049  NA 140  K 2.7*  CL 107  CO2 19*  GLUCOSE 242*  BUN 18  CREATININE 1.09  CALCIUM 8.3*  MG 2.3   GFR: Estimated Creatinine Clearance: 91.6 mL/min (by C-G formula based on SCr of 1.09 mg/dL). Recent Labs  Lab 10/25/21 0049  PROCALCITON <0.10  WBC 9.2    Liver Function Tests: Recent Labs  Lab 10/25/21 0049  AST 126*  ALT 101*  ALKPHOS 93  BILITOT 0.3  PROT 7.1  ALBUMIN 3.7   No results for input(s): "LIPASE", "AMYLASE" in the last 168 hours. No results for input(s): "AMMONIA" in the last 168 hours.  ABG    Component Value Date/Time   PHART 7.38 10/25/2021 0101   PCO2ART 34 10/25/2021 0101   PO2ART 151 (H) 10/25/2021 0101   HCO3 20.1 10/25/2021 0101   ACIDBASEDEF 4.2 (H) 10/25/2021 0101   O2SAT 99.8 10/25/2021 0101     Coagulation Profile: Recent Labs  Lab 10/25/21 0049  INR 1.0    Cardiac Enzymes: No results for input(s): "CKTOTAL", "CKMB", "CKMBINDEX", "TROPONINI" in the last 168 hours.  HbA1C: No results found for: "HGBA1C"  CBG: Recent Labs  Lab 10/25/21 0232  GLUCAP 208*    Review  of Systems:   Intubated/sedated  Past Medical History:  He,  has no past medical history on file.   Surgical History:  History reviewed. No pertinent surgical history.   Social History:   reports that he has been smoking cigarettes. He has never used smokeless tobacco. He reports current alcohol use of about 4.0 standard drinks of alcohol per week. He reports that he does not currently use drugs after having used the following drugs: Marijuana.   Family History:  His family history is not on file.   Allergies No Known Allergies   Home Medications  Prior to Admission medications   Medication Sig Start Date End Date Taking? Authorizing Provider  albuterol (VENTOLIN HFA) 108 (90 Base) MCG/ACT inhaler Inhale 2 puffs into the  lungs every 6 (six) hours as needed for wheezing or shortness of breath. 05/27/19   Willy Eddy, MD     Critical care time: 33 min cc time

## 2021-10-25 NOTE — Progress Notes (Signed)
*  PRELIMINARY RESULTS* Echocardiogram 2D Echocardiogram has been performed.  Joanette Gula Shaquana Buel 10/25/2021, 10:13 AM

## 2021-10-25 NOTE — ED Notes (Signed)
This RN assumed care of the patient from Carney [place], [place].

## 2021-10-25 NOTE — Consult Note (Signed)
Cardiology Consultation:   Patient ID: Blake Moore MRN: [id number]; DOB: 1970-06-23  Admit date: 10/25/2021 Date of Consult: 10/25/2021  PCP:  Blake Moore, No   [facility] Providers Cardiologist:  None        Patient Profile:   Blake Moore is a 51 y.o. male with no significant medical history who is being seen 10/25/2021 for the evaluation of cardiac arrest at the request of Dr. Mortimer Fries.  History of Present Illness:   Mr. Blake Moore is a 51 year old male who presented with out of hospital cardiac arrest.  The patient had intermittent chest pain over the last week and was seen in the ED for evaluation.  EKG was unremarkable and troponin was normal.  The patient was asleep but when his significant other came to bed, the patient jumped out of bed and was incoherent.  He went to the kitchen and started stumbling and fell forward and hit the ground.  CPR was initiated and EMS were called.  EMS arrived within 1 to 2 minutes and applied AED.  2 shocks were delivered.  The patient was again noted to have V-fib and was given another 2 rounds of defibrillation.  Downtime was 10 to 12 minutes before obtaining ROSC.  Initial EKG was worrisome for possible anterior STEMI and thus a code STEMI was activated. Repeat EKG in the ED showed no acute changes and thus code STEMI was canceled by Dr. Ellyn Hack.  His labs were significant for positive marijuana and low potassium.  His troponin peaked at 1400.  The patient is still intubated and sedated.  He had an episode of agitation this afternoon and was fighting the endotracheal tube.   History reviewed. No pertinent past medical history.  History reviewed. No pertinent surgical history.   Home Medications:  Prior to Admission medications   Medication Sig Start Date End Date Taking? Authorizing Provider  albuterol (VENTOLIN HFA) 108 (90 Base) MCG/ACT inhaler Inhale 2 puffs into the lungs every 6 (six) hours as needed for wheezing or shortness of breath. Patient not  taking: Reported on 10/25/2021 05/27/19   Merlyn Lot, MD    Inpatient Medications: Scheduled Meds:  acetaminophen  650 mg Oral Q4H   Or   acetaminophen (TYLENOL) oral liquid 160 mg/5 mL  650 mg Per Tube Q4H   Or   acetaminophen  650 mg Rectal Q4H   [START ON 10/26/2021] aspirin  81 mg Per Tube Daily   atorvastatin  80 mg Per Tube Daily   Chlorhexidine Gluconate Cloth  6 each Topical Q0600   docusate  100 mg Per Tube BID   free water  30 mL Per Tube Q4H   insulin aspart  0-6 Units Subcutaneous Q4H   pantoprazole sodium  40 mg Per Tube Daily   polyethylene glycol  17 g Per Tube Daily   ziprasidone  20 mg Intramuscular Once   Continuous Infusions:  feeding supplement (VITAL AF 1.2 CAL) 40 mL/hr at 10/25/21 1850   fentaNYL infusion INTRAVENOUS 100 mcg/hr (10/25/21 1850)   heparin 1,000 Units/hr (10/25/21 1850)   lactated ringers 75 mL/hr at 10/25/21 1850   levETIRAcetam Stopped (10/25/21 1155)   magnesium sulfate     propofol (DIPRIVAN) infusion 50 mcg/kg/min (10/25/21 1925)   PRN Meds: busPIRone **OR** busPIRone, fentaNYL, magnesium sulfate, midazolam, midazolam, ondansetron (ZOFRAN) IV  Allergies:   No Known Allergies  Social History:   Social History   Socioeconomic History   Marital status: Single    Spouse name: Not on file  Number of children: Not on file   Years of education: Not on file   Highest education level: Not on file  Occupational History   Not on file  Tobacco Use   Smoking status: Some Days    Types: Cigarettes   Smokeless tobacco: Never  Substance and Sexual Activity   Alcohol use: Yes    Alcohol/week: 4.0 standard drinks of alcohol    Types: 4 Cans of beer per week    Comment: everyday, couple of beers   Drug use: Not Currently    Types: Marijuana   Sexual activity: Not on file  Other Topics Concern   Not on file  Social History Narrative   Not on file   Social Determinants of Health   Financial Resource Strain: Not on file  Food  Insecurity: Not on file  Transportation Needs: Not on file  Physical Activity: Not on file  Stress: Not on file  Social Connections: Not on file  Intimate Partner Violence: Not on file    Family History:   History reviewed. No pertinent family history.   ROS:  Please see the history of present illness.   All other ROS reviewed and negative.     Physical Exam/Data:   Vitals:   10/25/21 1755 10/25/21 1800 10/25/21 1830 10/25/21 1908  BP:  96/71 102/71   Pulse: 96 98 92   Resp: 18 18 18    Temp: 99.1 F (37.3 C) 99.1 F (37.3 C) 99 F (37.2 C)   TempSrc:      SpO2: 100% 100% 100% 100%  Weight:      Height:        Intake/Output Summary (Last 24 hours) at 10/25/2021 1938 Last data filed at 10/25/2021 1925 Gross per 24 hour  Intake 4377.35 ml  Output 2100 ml  Net 2277.35 ml      10/25/2021    5:00 AM 10/25/2021   12:49 AM 10/10/2021   10:51 AM  Last 3 Weights  Weight (lbs) 187 lb 6.3 oz 187 lb 8 oz 180 lb  Weight (kg) 85 kg 85.049 kg 81.647 kg     Body mass index is 24.72 kg/m.  General:   Intubated and sedated HEENT: normal Neck: no JVD Vascular: No carotid bruits; Distal pulses 2+ bilaterally Cardiac:  normal S1, S2; RRR; no murmur  Lungs:  clear to auscultation bilaterally, no wheezing, rhonchi or rales  Abd: soft, nontender, no hepatomegaly  Ext: no edema Musculoskeletal:  No deformities, BUE and BLE strength normal and equal Skin: warm and dry  Neuro:  CNs 2-12 intact, no focal abnormalities noted Psych:  Normal affect   EKG:  The EKG was personally reviewed and demonstrates: Initial EKG showed sinus rhythm with minor ST elevation consistent with early repolarization. Telemetry:  Telemetry was personally reviewed and demonstrates: Sinus rhythm  Relevant CV Studies: Echocardiogram was done today and was personally reviewed by me.  It showed mildly reduced LV systolic function with no significant valvular abnormalities.  Laboratory Data:  High Sensitivity  Troponin:   Recent Labs  Lab 10/10/21 1059 10/25/21 0049 10/25/21 0318 10/25/21 0920 10/25/21 1115  TROPONINIHS <2 16 477* 1,406* 1,437*     Chemistry Recent Labs  Lab 10/25/21 0049 10/25/21 0615 10/25/21 0920  NA 140  --  140  K 2.7*  --  3.9  CL 107  --  110  CO2 19*  --  25  GLUCOSE 242*  --  104*  BUN 18  --  19  CREATININE 1.09  --  0.84  CALCIUM 8.3*  --  7.9*  MG 2.3 2.1  --   GFRNONAA >60  --  >60  ANIONGAP 14  --  5    Recent Labs  Lab 10/25/21 0049 10/25/21 0920  PROT 7.1 6.1*  ALBUMIN 3.7 3.2*  AST 126* 193*  ALT 101* 99*  ALKPHOS 93 66  BILITOT 0.3 0.4   Lipids  Recent Labs  Lab 10/25/21 1115  CHOL 124  TRIG 38  HDL 41  LDLCALC 75  CHOLHDL 3.0    Hematology Recent Labs  Lab 10/25/21 0049 10/25/21 0615 10/25/21 0920  WBC 9.2 10.6* 9.1  RBC 5.05 4.87 4.47  HGB 13.3 12.8* 11.9*  HCT 43.7 41.6 37.7*  MCV 86.5 85.4 84.3  MCH 26.3 26.3 26.6  MCHC 30.4 30.8 31.6  RDW 12.7 12.7 12.6  PLT 364 290 275   Thyroid No results for input(s): "TSH", "FREET4" in the last 168 hours.  BNP Recent Labs  Lab 10/25/21 0049  BNP 25.5    DDimer No results for input(s): "DDIMER" in the last 168 hours.   Radiology/Studies:  DG Chest Port 1 View  Result Date: 10/25/2021 CLINICAL DATA:  Central venous catheter placement EXAM: PORTABLE CHEST 1 VIEW COMPARISON:  Previous studies including the examination done earlier today FINDINGS: There is interval placement of left IJ central venous catheter with its tip in the superior vena cava. Tip of endotracheal tube is 6 cm above the carina. Enteric tube is noted traversing the esophagus. Central pulmonary vessels are prominent. Increased interstitial markings are seen in the parahilar regions and lower lung fields, more so in medial left lower lung fields. There is no pleural effusion or pneumothorax. IMPRESSION: Tip of left IJ central venous catheter is seen in the superior vena cava. There is no pneumothorax.  Central pulmonary vessels are prominent. Increased interstitial markings are seen in the both parahilar regions and both lower lung fields suggesting pulmonary edema or interstitial pneumonia. Electronically Signed   By: Elmer Picker M.D.   On: 10/25/2021 15:09   ECHOCARDIOGRAM COMPLETE  Result Date: 10/25/2021    ECHOCARDIOGRAM REPORT   Patient Name:   KONSTANTINE VOGELPOHL Date of Exam: 10/25/2021 Medical Rec #:  [id number]     Height:       73.0 in Accession #:    [id number]    Weight:       187.4 lb Date of Birth:  [birth year]     BSA:          2.093 m Patient Age:    42 years      BP:           101/67 mmHg Patient Gender: M             HR:           81 bpm. Exam Location:  [facility] Procedure: 2D Echo, Color Doppler and Cardiac Doppler Indications:     I46.9 Cardiac arrest  History:         Patient has no prior history of Echocardiogram examinations.                  Signs/Symptoms:Chest Pain.  Sonographer:     Charmayne Sheer Referring Phys:  [id number] Candee Furbish Diagnosing Phys: Ida Rogue MD  Sonographer Comments: Echo performed with patient supine and on artificial respirator. IMPRESSIONS  1. Left ventricular ejection fraction, by estimation, is 45 to 50%. The left ventricle has mildly decreased function.  The left ventricle demonstrates global hypokinesis. Left ventricular diastolic parameters were normal.  2. Right ventricular systolic function is low normal. The right ventricular size is normal. Tricuspid regurgitation signal is inadequate for assessing PA pressure.  3. The mitral valve is normal in structure. Trivial mitral valve regurgitation. No evidence of mitral stenosis.  4. The aortic valve is tricuspid. Aortic valve regurgitation is not visualized. No aortic stenosis is present.  5. The inferior vena cava is dilated in size with >50% respiratory variability, suggesting right atrial pressure of 8 mmHg. FINDINGS  Left Ventricle: Left ventricular ejection fraction, by estimation, is 45 to 50%. The left  ventricle has mildly decreased function. The left ventricle demonstrates global hypokinesis. The left ventricular internal cavity size was normal in size. There is  no left ventricular hypertrophy. Left ventricular diastolic parameters were normal. Right Ventricle: The right ventricular size is normal. No increase in right ventricular wall thickness. Right ventricular systolic function is low normal. Tricuspid regurgitation signal is inadequate for assessing PA pressure. Left Atrium: Left atrial size was normal in size. Right Atrium: Right atrial size was normal in size. Pericardium: There is no evidence of pericardial effusion. Mitral Valve: The mitral valve is normal in structure. Trivial mitral valve regurgitation. No evidence of mitral valve stenosis. MV peak gradient, 1.5 mmHg. The mean mitral valve gradient is 1.0 mmHg. Tricuspid Valve: The tricuspid valve is normal in structure. Tricuspid valve regurgitation is mild . No evidence of tricuspid stenosis. Aortic Valve: The aortic valve is tricuspid. Aortic valve regurgitation is not visualized. No aortic stenosis is present. Aortic valve mean gradient measures 4.0 mmHg. Aortic valve peak gradient measures 6.9 mmHg. Aortic valve area, by VTI measures 3.02 cm. Pulmonic Valve: The pulmonic valve was normal in structure. Pulmonic valve regurgitation is not visualized. No evidence of pulmonic stenosis. Aorta: The aortic root is normal in size and structure. Venous: The inferior vena cava is dilated in size with greater than 50% respiratory variability, suggesting right atrial pressure of 8 mmHg. IAS/Shunts: No atrial level shunt detected by color flow Doppler.  LEFT VENTRICLE PLAX 2D LVIDd:         4.98 cm   Diastology LVIDs:         3.65 cm   LV e' medial:    10.20 cm/s LV PW:         1.07 cm   LV E/e' medial:  5.7 LV IVS:        0.78 cm   LV e' lateral:   11.40 cm/s LVOT diam:     2.40 cm   LV E/e' lateral: 5.1 LV SV:         68 LV SV Index:   33 LVOT Area:      4.52 cm  RIGHT VENTRICLE RV Basal diam:  5.39 cm RV Mid diam:    4.53 cm RV S prime:     11.20 cm/s LEFT ATRIUM             Index        RIGHT ATRIUM           Index LA diam:        3.00 cm 1.43 cm/m   RA Area:     19.90 cm LA Vol (A2C):   35.1 ml 16.77 ml/m  RA Volume:   63.40 ml  30.29 ml/m LA Vol (A4C):   27.0 ml 12.90 ml/m LA Biplane Vol: 33.5 ml 16.01 ml/m  AORTIC VALVE  PULMONIC VALVE AV Area (Vmax):    3.52 cm     PV Vmax:       1.03 m/s AV Area (Vmean):   3.26 cm     PV Vmean:      80.600 cm/s AV Area (VTI):     3.02 cm     PV VTI:        0.177 m AV Vmax:           131.00 cm/s  PV Peak grad:  4.2 mmHg AV Vmean:          93.800 cm/s  PV Mean grad:  3.0 mmHg AV VTI:            0.226 m AV Peak Grad:      6.9 mmHg AV Mean Grad:      4.0 mmHg LVOT Vmax:         102.00 cm/s LVOT Vmean:        67.600 cm/s LVOT VTI:          0.151 m LVOT/AV VTI ratio: 0.67  AORTA Ao Root diam: 3.50 cm MITRAL VALVE MV Area (PHT): 4.46 cm    SHUNTS MV Area VTI:   4.04 cm    Systemic VTI:  0.15 m MV Peak grad:  1.5 mmHg    Systemic Diam: 2.40 cm MV Mean grad:  1.0 mmHg MV Vmax:       0.61 m/s MV Vmean:      42.5 cm/s MV Decel Time: 170 msec MV E velocity: 57.80 cm/s MV A velocity: 51.00 cm/s MV E/A ratio:  1.13 Julien Nordmann MD Electronically signed by Julien Nordmann MD Signature Date/Time: 10/25/2021/12:23:30 PM    Final    DG Abd 1 View  Result Date: 10/25/2021 CLINICAL DATA:  Nasogastric tube placement EXAM: ABDOMEN - 1 VIEW COMPARISON:  CT of earlier today FINDINGS: Nasogastric tube terminates at the body of the stomach with the side port below the GE junction. Non-obstructive bowel gas pattern. Numerous leads and wires project over the lower chest and upper abdomen. Contrast within right renal collecting system. IMPRESSION: Nasogastric tube terminating at the body of the stomach. Electronically Signed   By: Jeronimo Greaves M.D.   On: 10/25/2021 09:00   CT Cervical Spine Wo Contrast  Result Date:  10/25/2021 CLINICAL DATA:  Status post trauma. EXAM: CT CERVICAL SPINE WITHOUT CONTRAST TECHNIQUE: Multidetector CT imaging of the cervical spine was performed without intravenous contrast. Multiplanar CT image reconstructions were also generated. RADIATION DOSE REDUCTION: This exam was performed according to the departmental dose-optimization program which includes automated exposure control, adjustment of the mA and/or kV according to patient size and/or use of iterative reconstruction technique. COMPARISON:  None Available. FINDINGS: Alignment: There is mild reversal of the normal cervical spine lordosis. Skull base and vertebrae: No acute fracture. No primary bone lesion or focal pathologic process. Soft tissues and spinal canal: No prevertebral fluid or swelling. No visible canal hematoma. Disc levels: Moderate severity endplate sclerosis and anterior osteophyte formation are seen at the levels of C4-C5, C5-C6 and C6-C7. Mild anterior osteophyte formation is also seen at the level of C7-T1. Moderate to marked severity intervertebral disc space narrowing is seen at C4-C5, C5-C6 and C6-C7. Normal, bilateral multilevel facet joints are noted. Upper chest: Negative. Other: Endotracheal and nasogastric tubes are in place. IMPRESSION: 1. No acute fracture or subluxation in the cervical spine. 2. Moderate to marked severity degenerative changes at the levels of C4-C5, C5-C6 and C6-C7. Electronically Signed  By: Virgina Norfolk M.D.   On: 10/25/2021 03:17   CT ABDOMEN PELVIS W CONTRAST  Result Date: 10/25/2021 CLINICAL DATA:  Status post cardiac arrest. EXAM: CT ABDOMEN AND PELVIS WITH CONTRAST TECHNIQUE: Multidetector CT imaging of the abdomen and pelvis was performed using the standard protocol following bolus administration of intravenous contrast. RADIATION DOSE REDUCTION: This exam was performed according to the departmental dose-optimization program which includes automated exposure control, adjustment of  the mA and/or kV according to patient size and/or use of iterative reconstruction technique. CONTRAST:  179mL OMNIPAQUE IOHEXOL 350 MG/ML SOLN COMPARISON:  None Available. FINDINGS: It should be noted that the study is limited secondary to patient motion. Lower chest: Moderate severity atelectasis is seen within the posterior aspect of the bilateral lung bases. Hepatobiliary: No focal liver abnormality is seen. No gallstones, gallbladder wall thickening, or biliary dilatation. Pancreas: Unremarkable. No pancreatic ductal dilatation or surrounding inflammatory changes. Spleen: Normal in size without focal abnormality. Adrenals/Urinary Tract: Adrenal glands are unremarkable. Kidneys are normal, without renal calculi, focal lesion, or hydronephrosis. A Foley catheter is seen within the urinary bladder. Stomach/Bowel: A nasogastric tube is in place with its distal tip seen within the body of the stomach. Stomach is within normal limits. Appendix appears normal. No evidence of bowel wall thickening, distention, or inflammatory changes. Vascular/Lymphatic: No significant vascular findings are present. No enlarged abdominal or pelvic lymph nodes. Reproductive: Prostate is unremarkable. Other: No abdominal wall hernia or abnormality. No abdominopelvic ascites. Musculoskeletal: Marked severity degenerative changes are seen within the lower lumbar spine at the level of L5-S1. IMPRESSION: 1. Moderate severity atelectasis within the posterior aspect of the bilateral lung bases. 2. No acute or active process within the abdomen or pelvis. 3. Marked severity degenerative changes within the lower lumbar spine at the level of L5-S1. Electronically Signed   By: Virgina Norfolk M.D.   On: 10/25/2021 03:15   CT Angio Chest PE W and/or Wo Contrast  Result Date: 10/25/2021 CLINICAL DATA:  Status post cardiac arrest. EXAM: CT ANGIOGRAPHY CHEST WITH CONTRAST TECHNIQUE: Multidetector CT imaging of the chest was performed using the  standard protocol during bolus administration of intravenous contrast. Multiplanar CT image reconstructions and MIPs were obtained to evaluate the vascular anatomy. RADIATION DOSE REDUCTION: This exam was performed according to the departmental dose-optimization program which includes automated exposure control, adjustment of the mA and/or kV according to patient size and/or use of iterative reconstruction technique. CONTRAST:  115mL OMNIPAQUE IOHEXOL 350 MG/ML SOLN COMPARISON:  None Available. FINDINGS: Cardiovascular: Satisfactory opacification of the pulmonary arteries to the segmental level. No evidence of pulmonary embolism. Normal heart size. No pericardial effusion. Mediastinum/Nodes: Endotracheal and nasogastric tubes are in place. No enlarged mediastinal, hilar, or axillary lymph nodes. Thyroid gland, trachea, and esophagus demonstrate no significant findings. Lungs/Pleura: Mild to moderate severity areas of atelectasis are seen within the posterior aspect of the bilateral lower lobes. There is no evidence of a pleural effusion pneumothorax. Upper Abdomen: No acute abnormality. Musculoskeletal: No chest wall abnormality. No acute or significant osseous findings. Review of the MIP images confirms the above findings. IMPRESSION: 1. No evidence of pulmonary embolism. 2. Mild to moderate severity posterior bilateral lower lobe atelectasis. Electronically Signed   By: Virgina Norfolk M.D.   On: 10/25/2021 03:12   CT HEAD WO CONTRAST (5MM)  Result Date: 10/25/2021 CLINICAL DATA:  Status post cardiac arrest. EXAM: CT HEAD WITHOUT CONTRAST TECHNIQUE: Contiguous axial images were obtained from the base of the skull through the  vertex without intravenous contrast. RADIATION DOSE REDUCTION: This exam was performed according to the departmental dose-optimization program which includes automated exposure control, adjustment of the mA and/or kV according to patient size and/or use of iterative reconstruction  technique. COMPARISON:  June 27, 2017 FINDINGS: Brain: No evidence of acute infarction, hemorrhage, hydrocephalus, extra-axial collection or mass lesion/mass effect. Vascular: No hyperdense vessel or unexpected calcification. Skull: Normal. Negative for fracture or focal lesion. Sinuses/Orbits: No acute finding. Other: None. IMPRESSION: No acute intracranial abnormality. Electronically Signed   By: Virgina Norfolk M.D.   On: 10/25/2021 03:09   DG Chest Portable 1 View  Result Date: 10/25/2021 CLINICAL DATA:  Intubated EXAM: PORTABLE CHEST 1 VIEW COMPARISON:  10/10/2021 FINDINGS: Endotracheal tube tip about 3.8 cm superior to the carina. No pleural effusion. Streaky pulmonary opacities in the upper lobes and left base. Normal cardiac size. No pneumothorax IMPRESSION: 1. Endotracheal tube tip about 3.8 cm superior to the carina 2. Question streaky infiltrates in the upper lobes and left base Electronically Signed   By: Donavan Foil M.D.   On: 10/25/2021 01:17     Assessment and Plan:   Out of hospital cardiac arrest: Initial rhythm was ventricular fibrillation.  The history is worrisome for a cardiac etiology.  However, his EKG did not meet STEMI criteria and his troponin elevation is not consistent with a STEMI.  Echocardiogram shows mildly reduced LV systolic function but no clear wall motion abnormalities.  Recommend aspirin, heparin for 48 hours and a statin.  If the patient has meaningful repeat neurologic recovery, recommend proceeding with left heart catheterization during this hospitalization.  Given that his downtime was not long, I suspect that most likely he will have reasonable neurologic function.  I discussed the plan with Dr. Mortimer Fries    For questions or updates, please contact [place] Please consult [email or web address] for contact info under    Signed, Kathlyn Sacramento, MD  10/25/2021 7:38 PM

## 2021-10-25 NOTE — Progress Notes (Signed)
Initial Nutrition Assessment  DOCUMENTATION CODES:   Not applicable  INTERVENTION:   Vital 1.2 @70ml /hr- Initiate at 15ml/hr and increase by 64ml/hr q 8 hours until goal rate is reached.   Free water flushes 45ml q4 hours to maintain tube patency   Regimen provides 2016kcal/day, 126g/day of protein and 155ml/day of free water  Pt at moderate refeed risk; recommend monitor potassium, magnesium and phosphorus labs daily until stable  NUTRITION DIAGNOSIS:   Inadequate oral intake related to inability to eat (pt ventilated and sedated) as evidenced by NPO status.  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  Pt sedated and ventilated. OGT in place. Will plan to initiate tube feeds today. Per chart, pt appears weight stable pta. Plan today is for EEG and possible MRI today.   Medications reviewed and include: aspirin, colace, heparin, insulin, protonix, miralax, propofol   Labs reviewed: K 3.9 wnl, P 3.3 wnl, Mg 2.1 wnl Cbgs- 104, 152, 208 x 24 hrs  Patient is currently intubated on ventilator support MV: 9.7 L/min Temp (24hrs), Avg:97.8 F (36.6 C), Min:96 F (35.6 C), Max:99.1 F (37.3 C)  Propofol: 12.75 ml/hr- provides 336kcal/day   MAP- >45mmHg   UOP- 77m   NUTRITION - FOCUSED PHYSICAL EXAM:  Flowsheet Row Most Recent Value  Orbital Region No depletion  Upper Arm Region Mild depletion  Thoracic and Lumbar Region No depletion  Buccal Region No depletion  Temple Region Mild depletion  Clavicle Bone Region No depletion  Clavicle and Acromion Bone Region No depletion  Scapular Bone Region No depletion  Dorsal Hand No depletion  Patellar Region Mild depletion  Anterior Thigh Region Mild depletion  Posterior Calf Region  Mild depletion  Edema (RD Assessment) None  Hair Reviewed  Eyes Reviewed  Mouth Reviewed  Skin Reviewed  Nails Reviewed   Diet Order:   Diet Order     None      EDUCATION NEEDS:   No education needs have been identified at this time  Skin:  Skin Assessment: Reviewed RN Assessment  Last BM:  pta  Height:   Ht Readings from Last 1 Encounters:  10/25/21 6\' 1"  (1.854 m)    Weight:   Wt Readings from Last 1 Encounters:  10/25/21 85 kg    Ideal Body Weight:  83.6 kg  BMI:  Body mass index is 24.72 kg/m.  Estimated Nutritional Needs:   Kcal:  2014kcal/day  Protein:  120-135g/day  Fluid:  2.5-2.8L/day  12/25/21 MS, RD, LDN Please refer to [facility] for RD and/or RD on-call/weekend/after hours pager

## 2021-10-25 NOTE — TOC Initial Note (Signed)
Transition of Care [facility]) - Initial/Assessment Note    Patient Details  Name: Blake Moore MRN: [id number] Date of Birth: 1970-08-25  Transition of Care [facility]) CM/SW Contact:    Shelbie Hutching, RN Phone Number: 10/25/2021, 11:26 AM  Clinical Narrative:                 Patient admitted after cardiac arrest, currently in the ICU, intubated and sedated.  Undetermined prognosis at this time.  TOC will follow hospital course.   Expected Discharge Plan:  (TBD) Barriers to Discharge: Continued Medical Work up   Patient Goals and CMS Choice Patient states their goals for this hospitalization and ongoing recovery are:: patient intubated and sedated- unable to state goals      Expected Discharge Plan and Services Expected Discharge Plan:  (TBD)   Discharge Planning Services: CM Consult                                          Prior Living Arrangements/Services   Lives with:: Significant Other                   Activities of Daily Living      Permission Sought/Granted                  Emotional Assessment              Admission diagnosis:  Cardiac arrest ([place]) [I46.9] Hypokalemia [E87.6] Cardiac arrest with ventricular fibrillation ([place]) [I46.9, I49.01] Patient Active Problem List   Diagnosis Date Noted   Cardiac arrest ([place]) 10/25/2021   PCP:  Kathyrn Lass Pharmacy:   [facility] [address], [place] [address] Phone: [phone] Fax: [phone]     Social Determinants of Health (SDOH) Interventions    Readmission Risk Interventions     No data to display

## 2021-10-25 NOTE — ED Notes (Signed)
Cards @ the bedside.  ?

## 2021-10-25 NOTE — Consult Note (Addendum)
PHARMACY CONSULT NOTE  Pharmacy Consult for Electrolyte Monitoring and Replacement   Recent Labs: Potassium (mmol/L)  Date Value  10/25/2021 2.7 (LL)   Magnesium (mg/dL)  Date Value  56/81/2751 2.1   Calcium (mg/dL)  Date Value  70/05/7492 8.3 (L)   Albumin (g/dL)  Date Value  49/67/5916 3.7   Phosphorus (mg/dL)  Date Value  38/46/6599 3.3   Sodium (mmol/L)  Date Value  10/25/2021 140   Assessment: Patient is a 51 y/o M with medical history including asthma who was BIBEMS postcardiac arrest. Patient is currently admitted to the ICU where he is intubated, sedated, and on mechanical ventilation. Pharmacy consulted to assist with electrolyte monitoring and replacement as indicated.  Goal of Therapy:  K > 4 Mg > 2  Plan:  --K 2.7, IV Kcl 10 mEq x 4 --Re-check BMP at 1200 --All other electrolytes with AM labs tomorrow  Update: Repeat K+ = 3.9  Tressie Ellis 10/25/2021 8:23 AM

## 2021-10-25 NOTE — Consult Note (Deleted)
ANTICOAGULATION CONSULT NOTE  Pharmacy Consult for IV Heparin Indication: chest pain/ACS  Patient Measurements: Height: 6\' 1"  (185.4 cm) Weight: 85 kg (187 lb 6.3 oz) IBW/kg (Calculated) : 79.9 Heparin Dosing Weight: 85 kg  Labs: Recent Labs    10/25/21 0049 10/25/21 0318 10/25/21 0615 10/25/21 0920 10/25/21 1115 10/25/21 1831  HGB 13.3  --  12.8* 11.9*  --   --   HCT 43.7  --  41.6 37.7*  --   --   PLT 364  --  290 275  --   --   APTT 29  --   --   --   --   --   LABPROT 12.9  --   --   --   --   --   INR 1.0  --   --   --   --   --   HEPARINUNFRC  --   --   --   --   --  0.55  CREATININE 1.09  --   --  0.84  --   --   TROPONINIHS 16 477*  --  1,406* 1,437*  --      Estimated Creatinine Clearance: 118.9 mL/min (by C-G formula based on SCr of 0.84 mg/dL).   Medical History: History reviewed. No pertinent past medical history.  Medications:  No anticoagulation prior to admission per my chart review  Assessment: Patient is a 51 y/o M with medical history including asthma who was BIBEMS after out of hospital cardiac arrest with ROSC after estimated downtime of 10-12 minutes. Rhythm Vfib upon EMS arrival s/p defibrillation x 2. Of note, patient was seen in the ED 5/25 with chest pain but given normal EKG, normal troponin, and no signs of acute distress, patient was discharged home. Pharmacy consulted to initiate and manage IV heparin for suspected ACS.  Baseline aPTT and PT-INR within normal limits. Baseline CBC overall normal.   6/9 1830 HL 0.55, therapeutic x 1  Goal of Therapy:  Heparin level 0.3-0.7 units/ml Monitor platelets by anticoagulation protocol: Yes   Plan:  --HL is therapeutic, Continue infusion at 1000 units/hr --HL in 6 hours for confirmation --Daily CBC per protocol while on IV heparin  Paulina Fusi, PharmD, BCPS 10/25/2021 7:23 PM

## 2021-10-25 NOTE — IPAL (Signed)
  Interdisciplinary Goals of Care Family Meeting   Date carried out: 10/25/2021  Location of the meeting: Phone conference  Member's involved: Physician and Family Member or next of kin      GOALS OF CARE DISCUSSION  The Clinical status was relayed to family in detail- Yahki(cousin) Mother Gregoire Bennis and [place] Ms Yetta Barre would like to be contacted at [phone] due to fact that mother is having a nervous breakdown and the 51 year old son is incarcerated  Updated and notified of patients medical condition- Patient remains unresponsive and will not open eyes to command.   Patient is having a weak cough and struggling to remove secretions.   Patient with increased WOB and using accessory muscles to breathe Explained to family course of therapy and the modalities   Patient with Progressive multiorgan failure with a very high probablity of a very minimal chance of meaningful recovery despite all aggressive and optimal medical therapy.  PATIENT REMAINS FULL CODE  Family understands the situation.  Patient with acute cardiac arrest with acute sCHF EF 45% with severe resp failure and findings concerning for anoxic brain injury  Family are satisfied with Plan of action and management. All questions answered  Additional CC time 35 mins   Keerat Denicola Santiago Glad, M.D.  [facility]  Medical Director [facility] Medical Director [facility] Cardio-Pulmonary Department

## 2021-10-26 ENCOUNTER — Inpatient Hospital Stay: Payer: 59

## 2021-10-26 DIAGNOSIS — I469 Cardiac arrest, cause unspecified: Secondary | ICD-10-CM | POA: Diagnosis not present

## 2021-10-26 DIAGNOSIS — I4901 Ventricular fibrillation: Secondary | ICD-10-CM

## 2021-10-26 DIAGNOSIS — I420 Dilated cardiomyopathy: Secondary | ICD-10-CM

## 2021-10-26 LAB — GLUCOSE, CAPILLARY
Glucose-Capillary: 118 mg/dL — ABNORMAL HIGH (ref 70–99)
Glucose-Capillary: 153 mg/dL — ABNORMAL HIGH (ref 70–99)
Glucose-Capillary: 155 mg/dL — ABNORMAL HIGH (ref 70–99)
Glucose-Capillary: 82 mg/dL (ref 70–99)
Glucose-Capillary: 85 mg/dL (ref 70–99)
Glucose-Capillary: 94 mg/dL (ref 70–99)
Glucose-Capillary: 95 mg/dL (ref 70–99)

## 2021-10-26 LAB — DIFFERENTIAL
Abs Immature Granulocytes: 0.07 10*3/uL (ref 0.00–0.07)
Basophils Absolute: 0.1 10*3/uL (ref 0.0–0.1)
Basophils Relative: 0 %
Eosinophils Absolute: 0 10*3/uL (ref 0.0–0.5)
Eosinophils Relative: 0 %
Immature Granulocytes: 0 %
Lymphocytes Relative: 4 %
Lymphs Abs: 0.7 10*3/uL (ref 0.7–4.0)
Monocytes Absolute: 0.9 10*3/uL (ref 0.1–1.0)
Monocytes Relative: 5 %
Neutro Abs: 15.6 10*3/uL — ABNORMAL HIGH (ref 1.7–7.7)
Neutrophils Relative %: 91 %

## 2021-10-26 LAB — CBC
HCT: 42.1 % (ref 39.0–52.0)
Hemoglobin: 13.1 g/dL (ref 13.0–17.0)
MCH: 26.4 pg (ref 26.0–34.0)
MCHC: 31.1 g/dL (ref 30.0–36.0)
MCV: 84.9 fL (ref 80.0–100.0)
Platelets: 287 10*3/uL (ref 150–400)
RBC: 4.96 MIL/uL (ref 4.22–5.81)
RDW: 12.8 % (ref 11.5–15.5)
WBC: 17.3 10*3/uL — ABNORMAL HIGH (ref 4.0–10.5)
nRBC: 0 % (ref 0.0–0.2)

## 2021-10-26 LAB — BLOOD CULTURE ID PANEL (REFLEXED) - BCID2

## 2021-10-26 LAB — THYROID PANEL WITH TSH
Free Thyroxine Index: 2.9 (ref 1.2–4.9)
T3 Uptake Ratio: 30 % (ref 24–39)
T4, Total: 9.8 ug/dL (ref 4.5–12.0)
TSH: 0.134 u[IU]/mL — ABNORMAL LOW (ref 0.450–4.500)

## 2021-10-26 LAB — PROCALCITONIN: Procalcitonin: 4.17 ng/mL

## 2021-10-26 LAB — URINE CULTURE: Culture: NO GROWTH

## 2021-10-26 LAB — BASIC METABOLIC PANEL
Anion gap: 5 (ref 5–15)
BUN: 17 mg/dL (ref 6–20)
CO2: 25 mmol/L (ref 22–32)
Calcium: 8.4 mg/dL — ABNORMAL LOW (ref 8.9–10.3)
Chloride: 110 mmol/L (ref 98–111)
Creatinine, Ser: 0.69 mg/dL (ref 0.61–1.24)
GFR, Estimated: >60 mL/min (ref >60)
Glucose, Bld: 104 mg/dL — ABNORMAL HIGH (ref 70–99)
Potassium: 3.6 mmol/L (ref 3.5–5.1)
Sodium: 140 mmol/L (ref 135–145)

## 2021-10-26 LAB — HEPARIN LEVEL (UNFRACTIONATED): Heparin Unfractionated: 0.54 IU/mL (ref 0.30–0.70)

## 2021-10-26 LAB — PHOSPHORUS: Phosphorus: 3.1 mg/dL (ref 2.5–4.6)

## 2021-10-26 LAB — TRIGLYCERIDES: Triglycerides: 73 mg/dL (ref <150)

## 2021-10-26 LAB — MAGNESIUM: Magnesium: 2 mg/dL (ref 1.7–2.4)

## 2021-10-26 MED ORDER — ACETAMINOPHEN 325 MG PO TABS
650.0000 mg | ORAL_TABLET | Freq: Four times a day (QID) | ORAL | Status: DC | PRN
  Administered 2021-10-26: 650 mg
  Filled 2021-10-26: qty 2

## 2021-10-26 MED ORDER — SODIUM CHLORIDE 0.9 % IV SOLN
3.0000 g | Freq: Four times a day (QID) | INTRAVENOUS | Status: AC
  Administered 2021-10-26 – 2021-10-30 (×19): 3 g via INTRAVENOUS
  Filled 2021-10-26: qty 8
  Filled 2021-10-26: qty 3
  Filled 2021-10-26: qty 8
  Filled 2021-10-26: qty 3
  Filled 2021-10-26: qty 8
  Filled 2021-10-26 (×3): qty 3
  Filled 2021-10-26: qty 8
  Filled 2021-10-26: qty 3
  Filled 2021-10-26 (×3): qty 8
  Filled 2021-10-26: qty 3
  Filled 2021-10-26 (×3): qty 8
  Filled 2021-10-26: qty 3
  Filled 2021-10-26: qty 8

## 2021-10-26 MED ORDER — LACTATED RINGERS IV BOLUS
500.0000 mL | Freq: Once | INTRAVENOUS | Status: AC
  Administered 2021-10-26: 500 mL via INTRAVENOUS

## 2021-10-26 MED ORDER — POTASSIUM CHLORIDE 10 MEQ/50ML IV SOLN
10.0000 meq | INTRAVENOUS | Status: AC
  Administered 2021-10-26 (×2): 10 meq via INTRAVENOUS
  Filled 2021-10-26 (×2): qty 50

## 2021-10-26 MED ORDER — VECURONIUM BROMIDE 10 MG IV SOLR
10.0000 mg | INTRAVENOUS | Status: DC | PRN
  Administered 2021-10-26: 10 mg via INTRAVENOUS
  Filled 2021-10-26: qty 10

## 2021-10-26 NOTE — Progress Notes (Signed)
PHARMACY - PHYSICIAN COMMUNICATION CRITICAL VALUE ALERT - BLOOD CULTURE IDENTIFICATION (BCID)  Blake Moore is an 51 y.o. male who presented to [facility] on 10/25/2021 with a chief complaint of cardiac arrest.   Assessment:  Staph species in 1 of 4 bottles (anaerobic),  BCID did not detect specific organism.  Pt is afebrile, WBC normal,  most likely a contaminant.  (include suspected source if known)  Name of physician (or Provider) Contacted: Cheryll Cockayne Andrey Farmer, NP   Current antibiotics: none   Changes to prescribed antibiotics recommended:  NP does not wish to start abx.   Results for orders placed or performed during the hospital encounter of 10/25/21  Blood Culture ID Panel (Reflexed) (Collected: 10/25/2021  3:18 AM)  Result Value Ref Range   Enterococcus faecalis NOT DETECTED NOT DETECTED   Enterococcus Faecium NOT DETECTED NOT DETECTED   Listeria monocytogenes NOT DETECTED NOT DETECTED   Staphylococcus species DETECTED (A) NOT DETECTED   Staphylococcus aureus (BCID) NOT DETECTED NOT DETECTED   Staphylococcus epidermidis NOT DETECTED NOT DETECTED   Staphylococcus lugdunensis NOT DETECTED NOT DETECTED   Streptococcus species NOT DETECTED NOT DETECTED   Streptococcus agalactiae NOT DETECTED NOT DETECTED   Streptococcus pneumoniae NOT DETECTED NOT DETECTED   Streptococcus pyogenes NOT DETECTED NOT DETECTED   A.calcoaceticus-baumannii NOT DETECTED NOT DETECTED   Bacteroides fragilis NOT DETECTED NOT DETECTED   Enterobacterales NOT DETECTED NOT DETECTED   Enterobacter cloacae complex NOT DETECTED NOT DETECTED   Escherichia coli NOT DETECTED NOT DETECTED   Klebsiella aerogenes NOT DETECTED NOT DETECTED   Klebsiella oxytoca NOT DETECTED NOT DETECTED   Klebsiella pneumoniae NOT DETECTED NOT DETECTED   Proteus species NOT DETECTED NOT DETECTED   Salmonella species NOT DETECTED NOT DETECTED   Serratia marcescens NOT DETECTED NOT DETECTED   Haemophilus influenzae NOT DETECTED  NOT DETECTED   Neisseria meningitidis NOT DETECTED NOT DETECTED   Pseudomonas aeruginosa NOT DETECTED NOT DETECTED   Stenotrophomonas maltophilia NOT DETECTED NOT DETECTED   Candida albicans NOT DETECTED NOT DETECTED   Candida auris NOT DETECTED NOT DETECTED   Candida glabrata NOT DETECTED NOT DETECTED   Candida krusei NOT DETECTED NOT DETECTED   Candida parapsilosis NOT DETECTED NOT DETECTED   Candida tropicalis NOT DETECTED NOT DETECTED   Cryptococcus neoformans/gattii NOT DETECTED NOT DETECTED    River Mckercher D 10/26/2021  12:18 AM

## 2021-10-26 NOTE — Progress Notes (Signed)
RT assisted with patient transport to MRI and back to ICU while patient was on the trilogy transport ventilator. No complications arose during trip.

## 2021-10-26 NOTE — Progress Notes (Signed)
Suspected Aspiration Pneumonia CXR 10/26/21: new RLL opacity noted. Overnight patient with low grade temperature while being controlled with Normothermia protocol and [employer]. Leukocytosis on CBC with left shift, PCT pending.   - tracheal aspirate sent - Unasyn initiated per Rx protocol - trend PCT, f/u cultures   Venetia Night, AGACNP-BC Acute Care Nurse Practitioner [facility]   [phone] / [phone] Please see Amion for pager details.

## 2021-10-26 NOTE — Procedures (Signed)
Routine EEG Report  Blake Moore is a 51 y.o. male with a history of cardiac arrest who is undergoing an EEG to evaluate for seizures.  Report: This EEG was acquired with electrodes placed according to the International 10-20 electrode system (including Fp1, Fp2, F3, F4, C3, C4, P3, P4, O1, O2, T3, T4, T5, T6, A1, A2, Fz, Cz, Pz). The following electrodes were missing or displaced: none.  There was no clear occipital dominant rhythm. The best background was continuous at 11-12 Hz. Some sleep spindles were identified. There was no focal slowing. There were no interictal epileptiform discharges. There were no electrographic seizures identified. Photic stimulation and hyperventilation were not performed.   Impression: This EEG was obtained while intubated and sedated on propofol and fentanyl and shows no abnormalities.  Su Monks, MD [facility] [phone]  If 7pm- 7am, please page neurology on call as listed in [place].

## 2021-10-26 NOTE — Progress Notes (Signed)
Pharmacy Antibiotic Note  Blake Moore is a 51 y.o. male admitted on 10/25/2021 with  aspiration PNA .  Pharmacy has been consulted for Unasyn dosing.  Plan: Unasyn 3 gm IV Q6H ordered to start on 6/10 @ 0700.   Height: 6\' 1"  (185.4 cm) Weight: 85 kg (187 lb 6.3 oz) IBW/kg (Calculated) : 79.9  Temp (24hrs), Avg:98.6 F (37 C), Min:95.4 F (35.2 C), Max:100.6 F (38.1 C)  Recent Labs  Lab 10/25/21 0049 10/25/21 0318 10/25/21 0615 10/25/21 0920 10/25/21 1224 10/26/21 0521  WBC 9.2  --  10.6* 9.1  --  17.3*  CREATININE 1.09  --   --  0.84  --  0.69  LATICACIDVEN  --  3.7* 2.9* 1.7 1.3  --     Estimated Creatinine Clearance: 124.8 mL/min (by C-G formula based on SCr of 0.69 mg/dL).    No Known Allergies  Antimicrobials this admission:   >>    >>   Dose adjustments this admission:   Microbiology results:  BCx:   UCx:    Sputum:    MRSA PCR:   Thank you for allowing pharmacy to be a part of this patient's care.  Annalynn Centanni D 10/26/2021 6:25 AM

## 2021-10-26 NOTE — Progress Notes (Signed)
Eeg done 

## 2021-10-26 NOTE — Progress Notes (Signed)
NAME:  Blake Moore, MRN:  [id number], DOB:  [birth year], LOS: 1 ADMISSION DATE:  10/25/2021, CONSULTATION DATE:  10/25/21 REFERRING MD:  Dr Leonides Schanz, CHIEF COMPLAINT:  OHCA    CC follow up cardiac arrest  History of Present IllnessSYJNOPSIS  Patient's significant other states that patient was complaining of chest pain last week and was seen in the emergency department.  He had been in his normal state of health today.  She states that he was sleeping and she came to bed after putting her son down and he "jumped out of bed" and was "incoherent".  She states he went to the kitchen and began stumbling and fell forward and then hit the ground.    He was not breathing and started CPR immediately.   EMS reports that the Police Department arrived within 1 to 2 minutes of the EMS call and applied an AED and administered 2 shocks.    On EMS arrival patient was in a V-fib arrest and they administered another 2 rounds of defibrillation.  He had a proximal downtime of 10 to 12 minutes and they obtained ROSC.  He was not given any medications.   No known cardiac history.  Significant other denies history of hypertension, diabetes, hyperlipidemia or tobacco use.  She states she thinks there is a family history of cardiac disease but is not sure.  EMS was concerned about a STEMI on their EKG and activated a code STEMI in route."  In ER, intubated for GCS 3.  Repeat EKG more benign, code STEMI cancelled.  Patient pan-scanned without proximal cause for arrest. Recent ER visit last week for left sided chest pain but trops neg.    PCCM asked to admit for post arrest care.  Pertinent  Medical History  ?Asthma: has albuterol at home  Significant Hospital Events: Including procedures, antibiotic start and stop dates in addition to other pertinent events   6/9 admitted to ICU for cardiac arrest 6/10 severe resp failure s/p cardiac arrest  Interim History / Subjective:  Remains critically ill Remains on vent Very  agitated Plan for MRI  Objective   Blood pressure (!) 144/100, pulse (!) 116, temperature (!) 96.6 F (35.9 C), resp. rate (!) 24, height _0  (1.854 m), weight 85 kg, SpO2 100 %.    Vent Mode: PRVC FiO2 (%):  [40 %] 40 % Set Rate:  [18 bmp] 18 bmp Vt Set:  [500 mL] 500 mL PEEP:  [5 cmH20] 5 cmH20 Plateau Pressure:  [17 cmH20] 17 cmH20   Intake/Output Summary (Last 24 hours) at 10/26/2021 1443 Last data filed at 10/26/2021 1540 Gross per 24 hour  Intake 1636.01 ml  Output 1950 ml  Net -313.99 ml   Filed Weights   10/25/21 0049 10/25/21 0500  Weight: 85 kg 85 kg    REVIEW OF SYSTEMS  PATIENT IS UNABLE TO PROVIDE COMPLETE REVIEW OF SYSTEMS DUE TO SEVERE CRITICAL ILLNESS AND TOXIC METABOLIC ENCEPHALOPATHY    PHYSICAL EXAMINATION:  GENERAL:critically ill appearing, +resp distress EYES: Pupils equal, round, reactive to light.  No scleral icterus.  MOUTH: Moist mucosal membrane. INTUBATED NECK: Supple.  PULMONARY: +rhonchi, +wheezing CARDIOVASCULAR: S1 and S2.  No murmurs  GASTROINTESTINAL: Soft, nontender, -distended. Positive bowel sounds.  MUSCULOSKELETAL: No swelling, clubbing, or edema.  NEUROLOGIC: obtunded SKIN:intact,warm,dry     Assessment & Plan:   51 yo AAM with acute sudden cardiac arrest leading to severe acute hypoxic resp failure due to acute sCHF primary arrhythmia perhaps induced by drug use  and/or low K.     Severe ACUTE Hypoxic and Hypercapnic Respiratory Failure -continue Mechanical Ventilator support -Wean Fio2 and PEEP as tolerated -VAP/VENT bundle implementation - Wean PEEP & FiO2 as tolerated, maintain SpO2 > 88% - Head of bed elevated 30 degrees, VAP protocol in place - Plateau pressures less than 30 cm H20  - Intermittent chest x-ray & ABG PRN - Ensure adequate pulmonary hygiene  -will perform SAT/SBT when respiratory parameters are met   ACUTE SYSTOLIC CARDIAC FAILURE- EF 45% -oxygen as needed -Lasix as tolerated -follow up  cardiac enzymes as indicated -follow up cardiology recs NORMOTHERMIA PROTOCOL  INFECTIOUS DISEASE CXR WITH RLL pneumonia -continue antibiotics as prescribed -follow up cultures  NEUROLOGY ACUTE TOXIC METABOLIC ENCEPHALOPATHY -need for sedation -Goal RASS -2 to -3 MRI BRAIN PENDING STARTED KEPPRA 6/9   ENDO - ICU hypoglycemic\Hyperglycemia protocol -check FSBS per protocol   GI GI PROPHYLAXIS as indicated  NUTRITIONAL STATUS DIET-->TF's as tolerated Constipation protocol as indicated   ELECTROLYTES -follow labs as needed -replace as needed -pharmacy consultation and following   Best Practice (right click and "Reselect all SmartList Selections" daily)   Diet/type: NPO DVT prophylaxis: heparin subQ GI prophylaxis: PPI Lines: N/A Foley:  Yes, and it is still needed Code Status:  full code   Labs   CBC: Recent Labs  Lab 10/25/21 0049 10/25/21 0615 10/25/21 0920 10/26/21 0521  WBC 9.2 10.6* 9.1 17.3*  NEUTROABS 3.3  --  7.3 15.6*  HGB 13.3 12.8* 11.9* 13.1  HCT 43.7 41.6 37.7* 42.1  MCV 86.5 85.4 84.3 84.9  PLT 364 290 275 287     Basic Metabolic Panel: Recent Labs  Lab 10/25/21 0049 10/25/21 0615 10/25/21 0920 10/26/21 0521  NA 140  --  140 140  K 2.7*  --  3.9 3.6  CL 107  --  110 110  CO2 19*  --  25 25  GLUCOSE 242*  --  104* 104*  BUN 18  --  19 17  CREATININE 1.09  --  0.84 0.69  CALCIUM 8.3*  --  7.9* 8.4*  MG 2.3 2.1  --  2.0  PHOS  --  3.3  --  3.1    GFR: Estimated Creatinine Clearance: 124.8 mL/min (by C-G formula based on SCr of 0.69 mg/dL). Recent Labs  Lab 10/25/21 0049 10/25/21 0318 10/25/21 0615 10/25/21 0920 10/25/21 1224 10/26/21 0521  PROCALCITON <0.10  --   --   --   --  4.17  WBC 9.2  --  10.6* 9.1  --  17.3*  LATICACIDVEN  --  3.7* 2.9* 1.7 1.3  --      Liver Function Tests: Recent Labs  Lab 10/25/21 0049 10/25/21 0920  AST 126* 193*  ALT 101* 99*  ALKPHOS 93 66  BILITOT 0.3 0.4  PROT 7.1 6.1*   ALBUMIN 3.7 3.2*    No results for input(s): "LIPASE", "AMYLASE" in the last 168 hours. No results for input(s): "AMMONIA" in the last 168 hours.  ABG    Component Value Date/Time   PHART 7.38 10/25/2021 0101   PCO2ART 34 10/25/2021 0101   PO2ART 151 (H) 10/25/2021 0101   HCO3 20.1 10/25/2021 0101   ACIDBASEDEF 4.2 (H) 10/25/2021 0101   O2SAT 99.8 10/25/2021 0101     Coagulation Profile: Recent Labs  Lab 10/25/21 0049  INR 1.0     Cardiac Enzymes: No results for input(s): "CKTOTAL", "CKMB", "CKMBINDEX", "TROPONINI" in the last 168 hours.  HbA1C: Hgb A1c MFr  Bld  Date/Time Value Ref Range Status  10/25/2021 06:15 AM 5.5 4.8 - 5.6 % Final    Comment:    (NOTE) Pre diabetes:          5.7%-6.4%  Diabetes:              >6.4%  Glycemic control for   <7.0% adults with diabetes      Critical Care Time devoted to patient care services described in this note is 55 minutes.  Critical care was necessary to treat /prevent imminent and life-threatening deterioration. Overall, patient is critically ill, prognosis is guarded.  Patient with Multiorgan failure and at high risk for cardiac arrest and death.    Corrin Parker, M.D.  [facility]  Medical Director [place] Director [facility] Cardio-Pulmonary Department

## 2021-10-26 NOTE — Progress Notes (Signed)
Progress Note  Patient Name: Blake Moore Date of Encounter: 10/26/2021  [facility] Cardiologist: [place] intubated, following cardiac arrest Episodes of agitation Plan for MRI On broad-spectrum antibiotics out of concern for aspiration  Inpatient Medications    Scheduled Meds:  acetaminophen  650 mg Oral Q4H   Or   acetaminophen (TYLENOL) oral liquid 160 mg/5 mL  650 mg Per Tube Q4H   Or   acetaminophen  650 mg Rectal Q4H   aspirin  81 mg Per Tube Daily   atorvastatin  80 mg Per Tube Daily   Chlorhexidine Gluconate Cloth  6 each Topical Q0600   docusate  100 mg Per Tube BID   free water  30 mL Per Tube Q4H   insulin aspart  0-6 Units Subcutaneous Q4H   pantoprazole sodium  40 mg Per Tube Daily   polyethylene glycol  17 g Per Tube Daily   sodium chloride flush  10-40 mL Intracatheter Q12H   ziprasidone  20 mg Intramuscular Once   Continuous Infusions:  ampicillin-sulbactam (UNASYN) IV Stopped (10/26/21 [id number])   feeding supplement (VITAL AF 1.2 CAL) 40 mL/hr at 10/26/21 0841   fentaNYL infusion INTRAVENOUS 150 mcg/hr (10/26/21 0841)   heparin 1,000 Units/hr (10/26/21 1001)   lactated ringers 75 mL/hr at 10/26/21 0841   levETIRAcetam 500 mg (10/26/21 1046)   magnesium sulfate     propofol (DIPRIVAN) infusion 50 mcg/kg/min (10/26/21 1105)   PRN Meds: busPIRone **OR** busPIRone, fentaNYL, magnesium sulfate, midazolam, midazolam, ondansetron (ZOFRAN) IV, sodium chloride flush   Vital Signs    Vitals:   10/26/21 1100 10/26/21 1112 10/26/21 1130 10/26/21 1200  BP: 110/80  110/77 108/73  Pulse: (!) 109  (!) 114 (!) 116  Resp: 18  18 19   Temp: (!) 96.3 F (35.7 C)  (!) 96.4 F (35.8 C) (!) 97.5 F (36.4 C)  TempSrc:      SpO2: 99% 98% 98% 97%  Weight:      Height:        Intake/Output Summary (Last 24 hours) at 10/26/2021 1230 Last data filed at 10/26/2021 0841 Gross per 24 hour  Intake 3914.95 ml  Output 1950 ml  Net 1964.95 ml       10/25/2021    5:00 AM 10/25/2021   12:49 AM 10/10/2021   10:51 AM  Last 3 Weights  Weight (lbs) 187 lb 6.3 oz 187 lb 8 oz 180 lb  Weight (kg) 85 kg 85.049 kg 81.647 kg      Telemetry    Normal sinus rhythm- Personally Reviewed  ECG     - Personally Reviewed  Physical Exam   GEN: Intubated, unresponsive Neck: No JVD Cardiac: RRR, no murmurs, rubs, or gallops.  Respiratory: Clear to auscultation bilaterally. GI: Soft, nontender, non-distended  MS: No edema; No deformity. Neuro:  Nonfocal  Psych: Unable to test  Labs    High Sensitivity Troponin:   Recent Labs  Lab 10/10/21 1059 10/25/21 0049 10/25/21 0318 10/25/21 0920 10/25/21 1115  TROPONINIHS <2 16 477* 1,406* 1,437*     Chemistry Recent Labs  Lab 10/25/21 0049 10/25/21 0615 10/25/21 0920 10/26/21 0521  NA 140  --  140 140  K 2.7*  --  3.9 3.6  CL 107  --  110 110  CO2 19*  --  25 25  GLUCOSE 242*  --  104* 104*  BUN 18  --  19 17  CREATININE 1.09  --  0.84 0.69  CALCIUM  8.3*  --  7.9* 8.4*  MG 2.3 2.1  --  2.0  PROT 7.1  --  6.1*  --   ALBUMIN 3.7  --  3.2*  --   AST 126*  --  193*  --   ALT 101*  --  99*  --   ALKPHOS 93  --  66  --   BILITOT 0.3  --  0.4  --   GFRNONAA >60  --  >60 >60  ANIONGAP 14  --  5 5    Lipids  Recent Labs  Lab 10/25/21 1115 10/26/21 0521  CHOL 124  --   TRIG 38 73  HDL 41  --   LDLCALC 75  --   CHOLHDL 3.0  --     Hematology Recent Labs  Lab 10/25/21 0615 10/25/21 0920 10/26/21 0521  WBC 10.6* 9.1 17.3*  RBC 4.87 4.47 4.96  HGB 12.8* 11.9* 13.1  HCT 41.6 37.7* 42.1  MCV 85.4 84.3 84.9  MCH 26.3 26.6 26.4  MCHC 30.8 31.6 31.1  RDW 12.7 12.6 12.8  PLT 290 275 287   Thyroid No results for input(s): "TSH", "FREET4" in the last 168 hours.  BNP Recent Labs  Lab 10/25/21 0049  BNP 25.5    DDimer No results for input(s): "DDIMER" in the last 168 hours.   Radiology    DG Chest Port 1 View  Result Date: 10/26/2021 CLINICAL DATA:  Intubation  EXAM: PORTABLE CHEST 1 VIEW COMPARISON:  Yesterday FINDINGS: Endotracheal tube with tip at the clavicular heads. Left IJ line with tip at the SVC. The enteric tube tip and side-port reaches the stomach. Hazy opacity at the right more than left base, progressed especially on the right. No visible effusion or pneumothorax. Normal heart size. Artifact from EKG leads. IMPRESSION: 1. Unremarkable hardware. 2. Atelectasis at the lung bases by recent chest CT. Aeration has worsened on the right since yesterday. Electronically Signed   By: Jorje Guild M.D.   On: 10/26/2021 05:44   DG Chest Port 1 View  Result Date: 10/25/2021 CLINICAL DATA:  Central venous catheter placement EXAM: PORTABLE CHEST 1 VIEW COMPARISON:  Previous studies including the examination done earlier today FINDINGS: There is interval placement of left IJ central venous catheter with its tip in the superior vena cava. Tip of endotracheal tube is 6 cm above the carina. Enteric tube is noted traversing the esophagus. Central pulmonary vessels are prominent. Increased interstitial markings are seen in the parahilar regions and lower lung fields, more so in medial left lower lung fields. There is no pleural effusion or pneumothorax. IMPRESSION: Tip of left IJ central venous catheter is seen in the superior vena cava. There is no pneumothorax. Central pulmonary vessels are prominent. Increased interstitial markings are seen in the both parahilar regions and both lower lung fields suggesting pulmonary edema or interstitial pneumonia. Electronically Signed   By: Elmer Picker M.D.   On: 10/25/2021 15:09   ECHOCARDIOGRAM COMPLETE  Result Date: 10/25/2021    ECHOCARDIOGRAM REPORT   Patient Name:   Blake Moore Date of Exam: 10/25/2021 Medical Rec #:  [id number]     Height:       73.0 in Accession #:    [id number]    Weight:       187.4 lb Date of Birth:  26-Feb-1971     BSA:          2.093 m Patient Age:    50 years  BP:           101/67 mmHg  Patient Gender: M             HR:           81 bpm. Exam Location:  [facility] Procedure: 2D Echo, Color Doppler and Cardiac Doppler Indications:     I46.9 Cardiac arrest  History:         Patient has no prior history of Echocardiogram examinations.                  Signs/Symptoms:Chest Pain.  Sonographer:     Charmayne Sheer Referring Phys:  [id number] Candee Furbish Diagnosing Phys: Ida Rogue MD  Sonographer Comments: Echo performed with patient supine and on artificial respirator. IMPRESSIONS  1. Left ventricular ejection fraction, by estimation, is 45 to 50%. The left ventricle has mildly decreased function. The left ventricle demonstrates global hypokinesis. Left ventricular diastolic parameters were normal.  2. Right ventricular systolic function is low normal. The right ventricular size is normal. Tricuspid regurgitation signal is inadequate for assessing PA pressure.  3. The mitral valve is normal in structure. Trivial mitral valve regurgitation. No evidence of mitral stenosis.  4. The aortic valve is tricuspid. Aortic valve regurgitation is not visualized. No aortic stenosis is present.  5. The inferior vena cava is dilated in size with >50% respiratory variability, suggesting right atrial pressure of 8 mmHg. FINDINGS  Left Ventricle: Left ventricular ejection fraction, by estimation, is 45 to 50%. The left ventricle has mildly decreased function. The left ventricle demonstrates global hypokinesis. The left ventricular internal cavity size was normal in size. There is  no left ventricular hypertrophy. Left ventricular diastolic parameters were normal. Right Ventricle: The right ventricular size is normal. No increase in right ventricular wall thickness. Right ventricular systolic function is low normal. Tricuspid regurgitation signal is inadequate for assessing PA pressure. Left Atrium: Left atrial size was normal in size. Right Atrium: Right atrial size was normal in size. Pericardium: There is no evidence of  pericardial effusion. Mitral Valve: The mitral valve is normal in structure. Trivial mitral valve regurgitation. No evidence of mitral valve stenosis. MV peak gradient, 1.5 mmHg. The mean mitral valve gradient is 1.0 mmHg. Tricuspid Valve: The tricuspid valve is normal in structure. Tricuspid valve regurgitation is mild . No evidence of tricuspid stenosis. Aortic Valve: The aortic valve is tricuspid. Aortic valve regurgitation is not visualized. No aortic stenosis is present. Aortic valve mean gradient measures 4.0 mmHg. Aortic valve peak gradient measures 6.9 mmHg. Aortic valve area, by VTI measures 3.02 cm. Pulmonic Valve: The pulmonic valve was normal in structure. Pulmonic valve regurgitation is not visualized. No evidence of pulmonic stenosis. Aorta: The aortic root is normal in size and structure. Venous: The inferior vena cava is dilated in size with greater than 50% respiratory variability, suggesting right atrial pressure of 8 mmHg. IAS/Shunts: No atrial level shunt detected by color flow Doppler.  LEFT VENTRICLE PLAX 2D LVIDd:         4.98 cm   Diastology LVIDs:         3.65 cm   LV e' medial:    10.20 cm/s LV PW:         1.07 cm   LV E/e' medial:  5.7 LV IVS:        0.78 cm   LV e' lateral:   11.40 cm/s LVOT diam:     2.40 cm   LV E/e' lateral: 5.1 LV  SV:         68 LV SV Index:   33 LVOT Area:     4.52 cm  RIGHT VENTRICLE RV Basal diam:  5.39 cm RV Mid diam:    4.53 cm RV S prime:     11.20 cm/s LEFT ATRIUM             Index        RIGHT ATRIUM           Index LA diam:        3.00 cm 1.43 cm/m   RA Area:     19.90 cm LA Vol (A2C):   35.1 ml 16.77 ml/m  RA Volume:   63.40 ml  30.29 ml/m LA Vol (A4C):   27.0 ml 12.90 ml/m LA Biplane Vol: 33.5 ml 16.01 ml/m  AORTIC VALVE                    PULMONIC VALVE AV Area (Vmax):    3.52 cm     PV Vmax:       1.03 m/s AV Area (Vmean):   3.26 cm     PV Vmean:      80.600 cm/s AV Area (VTI):     3.02 cm     PV VTI:        0.177 m AV Vmax:           131.00  cm/s  PV Peak grad:  4.2 mmHg AV Vmean:          93.800 cm/s  PV Mean grad:  3.0 mmHg AV VTI:            0.226 m AV Peak Grad:      6.9 mmHg AV Mean Grad:      4.0 mmHg LVOT Vmax:         102.00 cm/s LVOT Vmean:        67.600 cm/s LVOT VTI:          0.151 m LVOT/AV VTI ratio: 0.67  AORTA Ao Root diam: 3.50 cm MITRAL VALVE MV Area (PHT): 4.46 cm    SHUNTS MV Area VTI:   4.04 cm    Systemic VTI:  0.15 m MV Peak grad:  1.5 mmHg    Systemic Diam: 2.40 cm MV Mean grad:  1.0 mmHg MV Vmax:       0.61 m/s MV Vmean:      42.5 cm/s MV Decel Time: 170 msec MV E velocity: 57.80 cm/s MV A velocity: 51.00 cm/s MV E/A ratio:  1.13 Ida Rogue MD Electronically signed by Ida Rogue MD Signature Date/Time: 10/25/2021/12:23:30 PM    Final    DG Abd 1 View  Result Date: 10/25/2021 CLINICAL DATA:  Nasogastric tube placement EXAM: ABDOMEN - 1 VIEW COMPARISON:  CT of earlier today FINDINGS: Nasogastric tube terminates at the body of the stomach with the side port below the GE junction. Non-obstructive bowel gas pattern. Numerous leads and wires project over the lower chest and upper abdomen. Contrast within right renal collecting system. IMPRESSION: Nasogastric tube terminating at the body of the stomach. Electronically Signed   By: Abigail Miyamoto M.D.   On: 10/25/2021 09:00   CT Cervical Spine Wo Contrast  Result Date: 10/25/2021 CLINICAL DATA:  Status post trauma. EXAM: CT CERVICAL SPINE WITHOUT CONTRAST TECHNIQUE: Multidetector CT imaging of the cervical spine was performed without intravenous contrast. Multiplanar CT image reconstructions were also generated. RADIATION DOSE REDUCTION: This exam was performed  according to the departmental dose-optimization program which includes automated exposure control, adjustment of the mA and/or kV according to patient size and/or use of iterative reconstruction technique. COMPARISON:  None Available. FINDINGS: Alignment: There is mild reversal of the normal cervical spine lordosis.  Skull base and vertebrae: No acute fracture. No primary bone lesion or focal pathologic process. Soft tissues and spinal canal: No prevertebral fluid or swelling. No visible canal hematoma. Disc levels: Moderate severity endplate sclerosis and anterior osteophyte formation are seen at the levels of C4-C5, C5-C6 and C6-C7. Mild anterior osteophyte formation is also seen at the level of C7-T1. Moderate to marked severity intervertebral disc space narrowing is seen at C4-C5, C5-C6 and C6-C7. Normal, bilateral multilevel facet joints are noted. Upper chest: Negative. Other: Endotracheal and nasogastric tubes are in place. IMPRESSION: 1. No acute fracture or subluxation in the cervical spine. 2. Moderate to marked severity degenerative changes at the levels of C4-C5, C5-C6 and C6-C7. Electronically Signed   By: Virgina Norfolk M.D.   On: 10/25/2021 03:17   CT ABDOMEN PELVIS W CONTRAST  Result Date: 10/25/2021 CLINICAL DATA:  Status post cardiac arrest. EXAM: CT ABDOMEN AND PELVIS WITH CONTRAST TECHNIQUE: Multidetector CT imaging of the abdomen and pelvis was performed using the standard protocol following bolus administration of intravenous contrast. RADIATION DOSE REDUCTION: This exam was performed according to the departmental dose-optimization program which includes automated exposure control, adjustment of the mA and/or kV according to patient size and/or use of iterative reconstruction technique. CONTRAST:  169mL OMNIPAQUE IOHEXOL 350 MG/ML SOLN COMPARISON:  None Available. FINDINGS: It should be noted that the study is limited secondary to patient motion. Lower chest: Moderate severity atelectasis is seen within the posterior aspect of the bilateral lung bases. Hepatobiliary: No focal liver abnormality is seen. No gallstones, gallbladder wall thickening, or biliary dilatation. Pancreas: Unremarkable. No pancreatic ductal dilatation or surrounding inflammatory changes. Spleen: Normal in size without focal  abnormality. Adrenals/Urinary Tract: Adrenal glands are unremarkable. Kidneys are normal, without renal calculi, focal lesion, or hydronephrosis. A Foley catheter is seen within the urinary bladder. Stomach/Bowel: A nasogastric tube is in place with its distal tip seen within the body of the stomach. Stomach is within normal limits. Appendix appears normal. No evidence of bowel wall thickening, distention, or inflammatory changes. Vascular/Lymphatic: No significant vascular findings are present. No enlarged abdominal or pelvic lymph nodes. Reproductive: Prostate is unremarkable. Other: No abdominal wall hernia or abnormality. No abdominopelvic ascites. Musculoskeletal: Marked severity degenerative changes are seen within the lower lumbar spine at the level of L5-S1. IMPRESSION: 1. Moderate severity atelectasis within the posterior aspect of the bilateral lung bases. 2. No acute or active process within the abdomen or pelvis. 3. Marked severity degenerative changes within the lower lumbar spine at the level of L5-S1. Electronically Signed   By: Virgina Norfolk M.D.   On: 10/25/2021 03:15   CT Angio Chest PE W and/or Wo Contrast  Result Date: 10/25/2021 CLINICAL DATA:  Status post cardiac arrest. EXAM: CT ANGIOGRAPHY CHEST WITH CONTRAST TECHNIQUE: Multidetector CT imaging of the chest was performed using the standard protocol during bolus administration of intravenous contrast. Multiplanar CT image reconstructions and MIPs were obtained to evaluate the vascular anatomy. RADIATION DOSE REDUCTION: This exam was performed according to the departmental dose-optimization program which includes automated exposure control, adjustment of the mA and/or kV according to patient size and/or use of iterative reconstruction technique. CONTRAST:  169mL OMNIPAQUE IOHEXOL 350 MG/ML SOLN COMPARISON:  None Available. FINDINGS: Cardiovascular:  Satisfactory opacification of the pulmonary arteries to the segmental level. No evidence  of pulmonary embolism. Normal heart size. No pericardial effusion. Mediastinum/Nodes: Endotracheal and nasogastric tubes are in place. No enlarged mediastinal, hilar, or axillary lymph nodes. Thyroid gland, trachea, and esophagus demonstrate no significant findings. Lungs/Pleura: Mild to moderate severity areas of atelectasis are seen within the posterior aspect of the bilateral lower lobes. There is no evidence of a pleural effusion pneumothorax. Upper Abdomen: No acute abnormality. Musculoskeletal: No chest wall abnormality. No acute or significant osseous findings. Review of the MIP images confirms the above findings. IMPRESSION: 1. No evidence of pulmonary embolism. 2. Mild to moderate severity posterior bilateral lower lobe atelectasis. Electronically Signed   By: Virgina Norfolk M.D.   On: 10/25/2021 03:12   CT HEAD WO CONTRAST (5MM)  Result Date: 10/25/2021 CLINICAL DATA:  Status post cardiac arrest. EXAM: CT HEAD WITHOUT CONTRAST TECHNIQUE: Contiguous axial images were obtained from the base of the skull through the vertex without intravenous contrast. RADIATION DOSE REDUCTION: This exam was performed according to the departmental dose-optimization program which includes automated exposure control, adjustment of the mA and/or kV according to patient size and/or use of iterative reconstruction technique. COMPARISON:  June 27, 2017 FINDINGS: Brain: No evidence of acute infarction, hemorrhage, hydrocephalus, extra-axial collection or mass lesion/mass effect. Vascular: No hyperdense vessel or unexpected calcification. Skull: Normal. Negative for fracture or focal lesion. Sinuses/Orbits: No acute finding. Other: None. IMPRESSION: No acute intracranial abnormality. Electronically Signed   By: Virgina Norfolk M.D.   On: 10/25/2021 03:09   DG Chest Portable 1 View  Result Date: 10/25/2021 CLINICAL DATA:  Intubated EXAM: PORTABLE CHEST 1 VIEW COMPARISON:  10/10/2021 FINDINGS: Endotracheal tube tip about  3.8 cm superior to the carina. No pleural effusion. Streaky pulmonary opacities in the upper lobes and left base. Normal cardiac size. No pneumothorax IMPRESSION: 1. Endotracheal tube tip about 3.8 cm superior to the carina 2. Question streaky infiltrates in the upper lobes and left base Electronically Signed   By: Donavan Foil M.D.   On: 10/25/2021 01:17    Cardiac Studies   Echocardiogram  1. Left ventricular ejection fraction, by estimation, is 45 to 50%. The  left ventricle has mildly decreased function. The left ventricle  demonstrates global hypokinesis. Left ventricular diastolic parameters  were normal.   2. Right ventricular systolic function is low normal. The right  ventricular size is normal. Tricuspid regurgitation signal is inadequate  for assessing PA pressure.   3. The mitral valve is normal in structure. Trivial mitral valve  regurgitation. No evidence of mitral stenosis.   4. The aortic valve is tricuspid. Aortic valve regurgitation is not  visualized. No aortic stenosis is present.   5. The inferior vena cava is dilated in size with >50% respiratory  variability, suggesting right atrial pressure of 8 mmHg.   Patient Profile     Blake Moore is a 51 y.o. male with no significant medical history who is being seen 10/25/2021 for the evaluation of cardiac arrest   Assessment & Plan    Out of hospital cardiac arrest Initial rhythm was ventricular fibrillation Echocardiogram shows mildly reduced LV systolic function but no clear wall motion abnormalities.   -Troponin peaking 1400 --aspirin, heparin for 48 hours and a statin.   --We will continue to monitor for neurologic recovery at which time additional cardiac work-up can be performed.  Would likely require cardiac catheterization, evaluation by EP.  Would likely benefit from cardiac MRI  Encephalopathy  Postarrest, EEG pending Remains intubated    Total encounter time more than 35 minutes  Greater than 50% was  spent in counseling and coordination of care with the patient    For questions or updates, please contact [place] Please consult [email or web address] for contact info under        Signed, Ida Rogue, MD  10/26/2021, 12:30 PM

## 2021-10-26 NOTE — Progress Notes (Signed)
ANTICOAGULATION CONSULT NOTE  Pharmacy Consult for IV Heparin Indication: chest pain/ACS  Patient Measurements: Height: 6\' 1"  (185.4 cm) Weight: 85 kg (187 lb 6.3 oz) IBW/kg (Calculated) : 79.9 Heparin Dosing Weight: 85 kg  Labs: Recent Labs    10/25/21 0049 10/25/21 0318 10/25/21 0615 10/25/21 0920 10/25/21 1115 10/25/21 1831 10/26/21 0056  HGB 13.3  --  12.8* 11.9*  --   --   --   HCT 43.7  --  41.6 37.7*  --   --   --   PLT 364  --  290 275  --   --   --   APTT 29  --   --   --   --   --   --   LABPROT 12.9  --   --   --   --   --   --   INR 1.0  --   --   --   --   --   --   HEPARINUNFRC  --   --   --   --   --  0.55 0.54  CREATININE 1.09  --   --  0.84  --   --   --   TROPONINIHS 16 477*  --  1,406* 1,437*  --   --     Estimated Creatinine Clearance: 118.9 mL/min (by C-G formula based on SCr of 0.84 mg/dL).   Medical History: History reviewed. No pertinent past medical history.  Medications:  No anticoagulation prior to admission per my chart review  Assessment: Patient is a 51 y/o M with medical history including asthma who was BIBEMS after out of hospital cardiac arrest with ROSC after estimated downtime of 10-12 minutes. Rhythm Vfib upon EMS arrival s/p defibrillation x 2. Of note, patient was seen in the ED 5/25 with chest pain but given normal EKG, normal troponin, and no signs of acute distress, patient was discharged home. Pharmacy consulted to initiate and manage IV heparin for suspected ACS.  Baseline aPTT and PT-INR within normal limits. Baseline CBC overall normal.   Goal of Therapy:  Heparin level 0.3-0.7 units/ml Monitor platelets by anticoagulation protocol: Yes   Date/Time Level  Comment 6/9@1831  HL 0.55 Therapeutic x1 6/10@0056     HL 0.54           Therapeutic X 2   Plan:  6/10:  HL @ 0056 = 0.54, therapeutic X 2 Will continue this pt on current rate and recheck HL on 6/11 with AM labs.   Syona Wroblewski D 10/26/2021 1:19 AM

## 2021-10-26 NOTE — Consult Note (Signed)
PHARMACY CONSULT NOTE  Pharmacy Consult for Electrolyte Monitoring and Replacement   Recent Labs: Potassium (mmol/L)  Date Value  10/26/2021 3.6   Magnesium (mg/dL)  Date Value  41/63/8453 2.0   Calcium (mg/dL)  Date Value  64/68/0321 8.4 (L)   Albumin (g/dL)  Date Value  22/48/2500 3.2 (L)   Phosphorus (mg/dL)  Date Value  37/08/8887 3.1   Sodium (mmol/L)  Date Value  10/26/2021 140   Assessment: Patient is a 51 y/o M with medical history including asthma who was BIBEMS postcardiac arrest. Patient is currently admitted to the ICU where he is intubated, sedated, and on mechanical ventilation. Pharmacy consulted to assist with electrolyte monitoring and replacement as indicated.  Goal of Therapy:  K > 4 Mg > 2  Plan:  --K 3.6,  Scr 0.69    Will order IV Kcl 10 mEq x 2 for goal >4.0 --f/u electrolytes with AM labs tomorrow   Blake Moore A 10/26/2021 8:48 AM

## 2021-10-27 DIAGNOSIS — E876 Hypokalemia: Secondary | ICD-10-CM

## 2021-10-27 DIAGNOSIS — I4901 Ventricular fibrillation: Secondary | ICD-10-CM | POA: Diagnosis not present

## 2021-10-27 DIAGNOSIS — I469 Cardiac arrest, cause unspecified: Secondary | ICD-10-CM | POA: Diagnosis not present

## 2021-10-27 LAB — BASIC METABOLIC PANEL
Anion gap: 3 — ABNORMAL LOW (ref 5–15)
BUN: 16 mg/dL (ref 6–20)
CO2: 28 mmol/L (ref 22–32)
Calcium: 7.8 mg/dL — ABNORMAL LOW (ref 8.9–10.3)
Chloride: 106 mmol/L (ref 98–111)
Creatinine, Ser: 0.82 mg/dL (ref 0.61–1.24)
GFR, Estimated: >60 mL/min (ref >60)
Glucose, Bld: 129 mg/dL — ABNORMAL HIGH (ref 70–99)
Potassium: 3.8 mmol/L (ref 3.5–5.1)
Sodium: 137 mmol/L (ref 135–145)

## 2021-10-27 LAB — HEPARIN LEVEL (UNFRACTIONATED)
Heparin Unfractionated: 0.2 IU/mL — ABNORMAL LOW (ref 0.30–0.70)
Heparin Unfractionated: 0.17 IU/mL — ABNORMAL LOW (ref 0.30–0.70)
Heparin Unfractionated: 0.27 IU/mL — ABNORMAL LOW (ref 0.30–0.70)

## 2021-10-27 LAB — CBC
HCT: 33.7 % — ABNORMAL LOW (ref 39.0–52.0)
Hemoglobin: 10.5 g/dL — ABNORMAL LOW (ref 13.0–17.0)
MCH: 26.5 pg (ref 26.0–34.0)
MCHC: 31.2 g/dL (ref 30.0–36.0)
MCV: 85.1 fL (ref 80.0–100.0)
Platelets: 226 10*3/uL (ref 150–400)
RBC: 3.96 MIL/uL — ABNORMAL LOW (ref 4.22–5.81)
RDW: 13.1 % (ref 11.5–15.5)
WBC: 12.2 10*3/uL — ABNORMAL HIGH (ref 4.0–10.5)
nRBC: 0 % (ref 0.0–0.2)

## 2021-10-27 LAB — GLUCOSE, CAPILLARY
Glucose-Capillary: 116 mg/dL — ABNORMAL HIGH (ref 70–99)
Glucose-Capillary: 158 mg/dL — ABNORMAL HIGH (ref 70–99)

## 2021-10-27 LAB — PHOSPHORUS: Phosphorus: 1.9 mg/dL — ABNORMAL LOW (ref 2.5–4.6)

## 2021-10-27 LAB — MAGNESIUM: Magnesium: 2 mg/dL (ref 1.7–2.4)

## 2021-10-27 LAB — PROCALCITONIN: Procalcitonin: 3.57 ng/mL

## 2021-10-27 MED ORDER — HEPARIN BOLUS VIA INFUSION
2500.0000 [IU] | Freq: Once | INTRAVENOUS | Status: AC
  Administered 2021-10-27: 2500 [IU] via INTRAVENOUS
  Filled 2021-10-27: qty 2500

## 2021-10-27 MED ORDER — HEPARIN BOLUS VIA INFUSION
1200.0000 [IU] | Freq: Once | INTRAVENOUS | Status: AC
  Administered 2021-10-27: 1200 [IU] via INTRAVENOUS
  Filled 2021-10-27: qty 1200

## 2021-10-27 MED ORDER — POTASSIUM PHOSPHATES 15 MMOLE/5ML IV SOLN
30.0000 mmol | Freq: Once | INTRAVENOUS | Status: AC
  Administered 2021-10-27: 30 mmol via INTRAVENOUS
  Filled 2021-10-27: qty 10

## 2021-10-27 MED ORDER — SODIUM CHLORIDE 0.9 % IV SOLN
12.5000 mg | Freq: Once | INTRAVENOUS | Status: AC
  Administered 2021-10-27: 12.5 mg via INTRAVENOUS
  Filled 2021-10-27 (×2): qty 0.5

## 2021-10-27 MED ORDER — PANTOPRAZOLE SODIUM 40 MG IV SOLR
40.0000 mg | INTRAVENOUS | Status: DC
  Administered 2021-10-28: 40 mg via INTRAVENOUS
  Filled 2021-10-27: qty 10

## 2021-10-27 NOTE — Progress Notes (Signed)
Extubation Procedure Note  Patient Details:   Name: Blake Moore DOB: 17-Dec-1970 MRN: [id number]   Airway Documentation:    Vent end date: (P) 10/27/21 Vent end time: (P) 0804   Evaluation  O2 sats: stable throughout Complications: No apparent complications Patient did tolerate procedure well. Bilateral Breath Sounds: Diminished   No Patient extubated per MD order. Placed on 3L with saturations of 98%. No distress observed after extubation.  Majel Homer Mosella Kasa 10/27/2021, 8:13 AM

## 2021-10-27 NOTE — IPAL (Signed)
  Interdisciplinary Goals of Care Family Meeting   Date carried out: 10/27/2021  Location of the meeting: Phone conference  Member's involved: Physician and Family Member or next of kin Huey Romans of patient [facility]    GOALS OF CARE DISCUSSION  The Clinical status was relayed to family in detail-  Updated and notified of patients medical condition- Explained to family course of therapy and the modalities    PATIENT REMAINS FULL CODE  Family understands the situation.  Family are satisfied with Plan of action and management. All questions answered  Additional CC time 25 mins   Laymon Stockert Santiago Glad, M.D.  [facility]  Medical Director [facility] Medical Director [facility] Cardio-Pulmonary Department

## 2021-10-27 NOTE — Progress Notes (Signed)
ANTICOAGULATION CONSULT NOTE  Pharmacy Consult for IV Heparin Indication: chest pain/ACS  Patient Measurements: Height: 6\' 1"  (185.4 cm) Weight: 85 kg (187 lb 6.3 oz) IBW/kg (Calculated) : 79.9 Heparin Dosing Weight: 85 kg  Labs: Recent Labs    10/25/21 0049 10/25/21 0318 10/25/21 0615 10/25/21 0920 10/25/21 1115 10/25/21 1831 10/26/21 0056 10/26/21 0521 10/27/21 0509  HGB 13.3  --    < > 11.9*  --   --   --  13.1 10.5*  HCT 43.7  --    < > 37.7*  --   --   --  42.1 33.7*  PLT 364  --    < > 275  --   --   --  287 226  APTT 29  --   --   --   --   --   --   --   --   LABPROT 12.9  --   --   --   --   --   --   --   --   INR 1.0  --   --   --   --   --   --   --   --   HEPARINUNFRC  --   --   --   --   --  0.55 0.54  --  0.20*  CREATININE 1.09  --   --  0.84  --   --   --  0.69  --   TROPONINIHS 16 477*  --  1,406* 1,437*  --   --   --   --    < > = values in this interval not displayed.    Estimated Creatinine Clearance: 124.8 mL/min (by C-G formula based on SCr of 0.69 mg/dL).   Medical History: History reviewed. No pertinent past medical history.  Medications:  No anticoagulation prior to admission per my chart review  Assessment: Patient is a 51 y/o M with medical history including asthma who was BIBEMS after out of hospital cardiac arrest with ROSC after estimated downtime of 10-12 minutes. Rhythm Vfib upon EMS arrival s/p defibrillation x 2. Of note, patient was seen in the ED 5/25 with chest pain but given normal EKG, normal troponin, and no signs of acute distress, patient was discharged home. Pharmacy consulted to initiate and manage IV heparin for suspected ACS.  Baseline aPTT and PT-INR within normal limits. Baseline CBC overall normal.   Goal of Therapy:  Heparin level 0.3-0.7 units/ml Monitor platelets by anticoagulation protocol: Yes   Date/Time Level  Comment 6/9@1831  HL 0.55 Therapeutic x1 6/10@0056     HL 0.54           Therapeutic X 2  6/11@0509      HL 0.20           SUBtherapeutic   Plan:  6/11:  HL @ 0509 = 0.2, SUBtherapeutic Will order heparin 1200 units IV X 1 bolus and increase drip rate to 1150 units/hr.  Will recheck HL 6 hrs after rate change.   Dhillon Comunale D 10/27/2021 6:04 AM

## 2021-10-27 NOTE — Progress Notes (Signed)
Pharmacy Antibiotic Note  Blake Moore is a 51 y.o. male admitted on 10/25/2021 with  aspiration PNA .  Pharmacy has been consulted for Unasyn dosing.  Plan: Unasyn 3 gm IV Q6H    Height: 6\' 1"  (185.4 cm) Weight: 85 kg (187 lb 6.3 oz) IBW/kg (Calculated) : 79.9  Temp (24hrs), Avg:98.4 F (36.9 C), Min:95 F (35 C), Max:100 F (37.8 C)  Recent Labs  Lab 10/25/21 0049 10/25/21 0318 10/25/21 0615 10/25/21 0920 10/25/21 1224 10/26/21 0521 10/27/21 0509  WBC 9.2  --  10.6* 9.1  --  17.3* 12.2*  CREATININE 1.09  --   --  0.84  --  0.69 0.82  LATICACIDVEN  --  3.7* 2.9* 1.7 1.3  --   --      Estimated Creatinine Clearance: 121.8 mL/min (by C-G formula based on SCr of 0.82 mg/dL).    No Known Allergies  Antimicrobials this admission: unasyn 6/10 >>   Dose adjustments this admission:   Microbiology results:  6/9 Bcx 1of4 (anaerobic)- 8/9 species-  ?contaminant  UCx:  NG  Sputum:    MRSA PCR:  6/9 trach asp: few GPC-  Thank you for allowing pharmacy to be a part of this patient's care.  Gracyn Allor A 10/27/2021 9:50 AM

## 2021-10-27 NOTE — Progress Notes (Signed)
When hanging last vial of propofol, I decreased the dose to 45 mcg, after about 45 minutes I had to increase propofol back to 50 mcg due to increased movement and attempting to sit up. Is responding to voice and followed commands. No violence or need to hold patient down.

## 2021-10-27 NOTE — Progress Notes (Signed)
Patient successfully extubated Alert and awake Not on pressors  SD status now, transfer tp [facility] 6/12

## 2021-10-27 NOTE — Progress Notes (Signed)
NAME:  Xayden Linsey, MRN:  [id number], DOB:  July 12, 1970, LOS: 2 ADMISSION DATE:  10/25/2021, CONSULTATION DATE:  10/25/21 REFERRING MD:  Dr Leonides Schanz, CHIEF COMPLAINT:  [facility]    CC follow up cardiac arrest  History of Present IllnessSYJNOPSIS  Patient's significant other states that patient was complaining of chest pain last week and was seen in the emergency department.  He had been in his normal state of health today.  She states that he was sleeping and she came to bed after putting her son down and he "jumped out of bed" and was "incoherent".  She states he went to the kitchen and began stumbling and fell forward and then hit the ground.    He was not breathing and started CPR immediately.   EMS reports that the Police [employer] arrived within 1 to 2 minutes of the EMS call and applied an AED and administered 2 shocks.    On EMS arrival patient was in a V-fib arrest and they administered another 2 rounds of defibrillation.  He had a proximal downtime of 10 to 12 minutes and they obtained ROSC.  He was not given any medications.   No known cardiac history.  Significant other denies history of hypertension, diabetes, hyperlipidemia or tobacco use.  She states she thinks there is a family history of cardiac disease but is not sure.  EMS was concerned about a STEMI on their EKG and activated a code STEMI in route."  In ER, intubated for GCS 3.  Repeat EKG more benign, code STEMI cancelled.  Patient pan-scanned without proximal cause for arrest. Recent ER visit last week for left sided chest pain but trops neg.    PCCM asked to admit for post arrest care.  Pertinent  Medical History  ?Asthma: has albuterol at home  Significant Hospital Events: Including procedures, antibiotic start and stop dates in addition to other pertinent events   6/9 admitted to ICU for cardiac arrest 6/10 severe resp failure s/p cardiac arrest 6/11 plan for SAT/SBT today  Interim History / Subjective:  Remains  critically ill Remains on vent Plan for [facility]UA today MRI brain NEG  Objective   Blood pressure 108/60, pulse (!) 113, temperature 98.8 F (37.1 C), resp. rate 18, height 6' 1" (1.854 m), weight 85 kg, SpO2 95 %.    Vent Mode: PRVC FiO2 (%):  [30 %] 30 % Set Rate:  [18 bmp-20 bmp] 20 bmp Vt Set:  [500 mL] 500 mL PEEP:  [5 cmH20] 5 cmH20 Plateau Pressure:  [12 cmH20-15 cmH20] 15 cmH20   Intake/Output Summary (Last 24 hours) at 10/27/2021 0719 Last data filed at 10/27/2021 5170 Gross per 24 hour  Intake 5523.12 ml  Output 1950 ml  Net 3573.12 ml    Filed Weights   10/25/21 0049 10/25/21 0500  Weight: 85 kg 85 kg    REVIEW OF SYSTEMS  PATIENT IS UNABLE TO PROVIDE COMPLETE REVIEW OF SYSTEMS DUE TO SEVERE CRITICAL ILLNESS AND TOXIC METABOLIC ENCEPHALOPATHY    PHYSICAL EXAMINATION:  GENERAL:critically ill appearing, +resp distress EYES: Pupils equal, round, reactive to light.  No scleral icterus.  MOUTH: Moist mucosal membrane. INTUBATED NECK: Supple.  PULMONARY: +rhonchi, +wheezing CARDIOVASCULAR: S1 and S2.  No murmurs  GASTROINTESTINAL: Soft, nontender, -distended. Positive bowel sounds.  MUSCULOSKELETAL: No swelling, clubbing, or edema.  NEUROLOGIC: obtunded SKIN:intact,warm,dry      Assessment & Plan:   51 yo AAM with acute sudden cardiac arrest leading to severe acute hypoxic resp failure due to acute  sCHF primary arrhythmia perhaps induced by drug use and/or low K.     Severe ACUTE Hypoxic and Hypercapnic Respiratory Failure -continue Mechanical Ventilator support -Wean Fio2 and PEEP as tolerated -VAP/VENT bundle implementation - Wean PEEP & FiO2 as tolerated, maintain SpO2 > 88% - Head of bed elevated 30 degrees, VAP protocol in place - Plateau pressures less than 30 cm H20  - Intermittent chest x-ray & ABG PRN - Ensure adequate pulmonary hygiene  -will perform SAT/SBT when respiratory parameters are met   ACUTE SYSTOLIC CARDIAC FAILURE- EF  45% -oxygen as needed -Lasix as tolerated -follow up cardiac enzymes as indicated -follow up cardiology recs Completed  NORMOTHERMIA PROTOCOL  INFECTIOUS DISEASE CXR WITH RLL pneumonia -continue antibiotics as prescribed -follow up cultures -follow up ID consultation   NEUROLOGY ACUTE TOXIC METABOLIC ENCEPHALOPATHY WUA pending Consider stopping Keppra    ENDO - ICU hypoglycemic\Hyperglycemia protocol -check FSBS per protocol   GI GI PROPHYLAXIS as indicated  NUTRITIONAL STATUS DIET-->TF's as tolerated Constipation protocol as indicated   ELECTROLYTES -follow labs as needed -replace as needed -pharmacy consultation and following   Best Practice (right click and "Reselect all SmartList Selections" daily)   Diet/type: NPO DVT prophylaxis: heparin subQ GI prophylaxis: PPI Lines: N/A Foley:  Yes, and it is still needed Code Status:  full code   Labs   CBC: Recent Labs  Lab 10/25/21 0049 10/25/21 0615 10/25/21 0920 10/26/21 0521 10/27/21 0509  WBC 9.2 10.6* 9.1 17.3* 12.2*  NEUTROABS 3.3  --  7.3 15.6*  --   HGB 13.3 12.8* 11.9* 13.1 10.5*  HCT 43.7 41.6 37.7* 42.1 33.7*  MCV 86.5 85.4 84.3 84.9 85.1  PLT 364 290 275 287 226     Basic Metabolic Panel: Recent Labs  Lab 10/25/21 0049 10/25/21 0615 10/25/21 0920 10/26/21 0521 10/27/21 0509  NA 140  --  140 140 137  K 2.7*  --  3.9 3.6 3.8  CL 107  --  110 110 106  CO2 19*  --  25 25 28  GLUCOSE 242*  --  104* 104* 129*  BUN 18  --  19 17 16  CREATININE 1.09  --  0.84 0.69 0.82  CALCIUM 8.3*  --  7.9* 8.4* 7.8*  MG 2.3 2.1  --  2.0 2.0  PHOS  --  3.3  --  3.1 1.9*    GFR: Estimated Creatinine Clearance: 121.8 mL/min (by C-G formula based on SCr of 0.82 mg/dL). Recent Labs  Lab 10/25/21 0049 10/25/21 0318 10/25/21 0615 10/25/21 0920 10/25/21 1224 10/26/21 0521 10/27/21 0509  PROCALCITON <0.10  --   --   --   --  4.17 3.57  WBC 9.2  --  10.6* 9.1  --  17.3* 12.2*  LATICACIDVEN   --  3.7* 2.9* 1.7 1.3  --   --      Liver Function Tests: Recent Labs  Lab 10/25/21 0049 10/25/21 0920  AST 126* 193*  ALT 101* 99*  ALKPHOS 93 66  BILITOT 0.3 0.4  PROT 7.1 6.1*  ALBUMIN 3.7 3.2*    No results for input(s): "LIPASE", "AMYLASE" in the last 168 hours. No results for input(s): "AMMONIA" in the last 168 hours.  ABG    Component Value Date/Time   PHART 7.38 10/25/2021 0101   PCO2ART 34 10/25/2021 0101   PO2ART 151 (H) 10/25/2021 0101   HCO3 20.1 10/25/2021 0101   ACIDBASEDEF 4.2 (H) 10/25/2021 0101   O2SAT 99.8 10/25/2021 0101       Coagulation Profile: Recent Labs  Lab 10/25/21 0049  INR 1.0     Cardiac Enzymes: No results for input(s): "CKTOTAL", "CKMB", "CKMBINDEX", "TROPONINI" in the last 168 hours.  HbA1C: Hgb A1c MFr Bld  Date/Time Value Ref Range Status  10/25/2021 06:15 AM 5.5 4.8 - 5.6 % Final    Comment:    (NOTE) Pre diabetes:          5.7%-6.4%  Diabetes:              >6.4%  Glycemic control for   <7.0% adults with diabetes       Critical Care Time devoted to patient care services described in this note is 45 minutes.  Critical care was necessary to treat /prevent imminent and life-threatening deterioration. Overall, patient is critically ill, prognosis is guarded.  Patient with Multiorgan failure and at high risk for cardiac arrest and death.    Corrin Parker, M.D.  [facility]  Medical Director [place] Director [facility] Cardio-Pulmonary Department

## 2021-10-27 NOTE — Consult Note (Signed)
[place] for Electrolyte Monitoring and Replacement   Recent Labs: Potassium (mmol/L)  Date Value  10/27/2021 3.8   Magnesium (mg/dL)  Date Value  10/27/2021 2.0   Calcium (mg/dL)  Date Value  10/27/2021 7.8 (L)   Albumin (g/dL)  Date Value  10/25/2021 3.2 (L)   Phosphorus (mg/dL)  Date Value  10/27/2021 1.9 (L)   Sodium (mmol/L)  Date Value  10/27/2021 137   Assessment: Patient is a 51 y/o M with medical history including asthma who was BIBEMS postcardiac arrest. Patient is currently admitted to the ICU where he is intubated, sedated, and on mechanical ventilation. Pharmacy consulted to assist with electrolyte monitoring and replacement as indicated.  Goal of Therapy:  K > 4 Mg > 2  Plan:  --K 3.8,  Phos 1.9  Mag 2.0  Na 137 Scr 0.82    -NP ordered KPhos 30 mmol IV x 1 --f/u electrolytes with AM labs tomorrow   Armonie Mettler A 10/27/2021 9:47 AM

## 2021-10-27 NOTE — Progress Notes (Signed)
ANTICOAGULATION CONSULT NOTE  Pharmacy Consult for IV Heparin Indication: chest pain/ACS  Patient Measurements: Height: 6\' 1"  (185.4 cm) Weight: 85 kg (187 lb 6.3 oz) IBW/kg (Calculated) : 79.9 Heparin Dosing Weight: 85 kg  Labs: Recent Labs    10/25/21 0049 10/25/21 0318 10/25/21 0615 10/25/21 0920 10/25/21 1115 10/25/21 1831 10/26/21 0521 10/27/21 0509 10/27/21 1336 10/27/21 2104  HGB 13.3  --    < > 11.9*  --   --  13.1 10.5*  --   --   HCT 43.7  --    < > 37.7*  --   --  42.1 33.7*  --   --   PLT 364  --    < > 275  --   --  287 226  --   --   APTT 29  --   --   --   --   --   --   --   --   --   LABPROT 12.9  --   --   --   --   --   --   --   --   --   INR 1.0  --   --   --   --   --   --   --   --   --   HEPARINUNFRC  --   --   --   --   --    < >  --  0.20* 0.17* 0.27*  CREATININE 1.09  --   --  0.84  --   --  0.69 0.82  --   --   TROPONINIHS 16 477*  --  1,406* 1,437*  --   --   --   --   --    < > = values in this interval not displayed.    Estimated Creatinine Clearance: 121.8 mL/min (by C-G formula based on SCr of 0.82 mg/dL).   Medical History: History reviewed. No pertinent past medical history.  Medications:  No anticoagulation prior to admission per my chart review  Assessment: Patient is a 51 y/o M with medical history including asthma who was BIBEMS after out of hospital cardiac arrest with ROSC after estimated downtime of 10-12 minutes. Rhythm Vfib upon EMS arrival s/p defibrillation x 2. Of note, patient was seen in the ED 5/25 with chest pain but given normal EKG, normal troponin, and no signs of acute distress, patient was discharged home. Pharmacy consulted to initiate and manage IV heparin for suspected ACS.  Baseline aPTT and PT-INR within normal limits. Baseline CBC overall normal.   Goal of Therapy:  Heparin level 0.3-0.7 units/ml Monitor platelets by anticoagulation protocol: Yes   Date/Time Level  Comment 6/9@1831  HL 0.55 Therapeutic  x1 6/10@0056     HL 0.54           Therapeutic X 2  6/11@0509     HL 0.20           SUBtherapeutic  6/11@1336  HL 0.17 6/11@2107     HL 0.27            SUBtherapeutic   Plan:  6/11:  HL @ 2107 = 0.27, SUBtherapeutic  Will order heparin 1600 units IV X 1 and increase drip rate to 1550 units/hr. Will recheck HL 6 hrs after rate change.  CBC daily  Blake Moore D 10/27/2021 9:37 PM

## 2021-10-27 NOTE — Progress Notes (Signed)
ANTICOAGULATION CONSULT NOTE  Pharmacy Consult for IV Heparin Indication: chest pain/ACS  Patient Measurements: Height: 6\' 1"  (185.4 cm) Weight: 85 kg (187 lb 6.3 oz) IBW/kg (Calculated) : 79.9 Heparin Dosing Weight: 85 kg  Labs: Recent Labs    10/25/21 0049 10/25/21 0318 10/25/21 0615 10/25/21 0920 10/25/21 1115 10/25/21 1831 10/26/21 0056 10/26/21 0521 10/27/21 0509 10/27/21 1336  HGB 13.3  --    < > 11.9*  --   --   --  13.1 10.5*  --   HCT 43.7  --    < > 37.7*  --   --   --  42.1 33.7*  --   PLT 364  --    < > 275  --   --   --  287 226  --   APTT 29  --   --   --   --   --   --   --   --   --   LABPROT 12.9  --   --   --   --   --   --   --   --   --   INR 1.0  --   --   --   --   --   --   --   --   --   HEPARINUNFRC  --   --   --   --   --    < > 0.54  --  0.20* 0.17*  CREATININE 1.09  --   --  0.84  --   --   --  0.69 0.82  --   TROPONINIHS 16 477*  --  1,406* 1,437*  --   --   --   --   --    < > = values in this interval not displayed.    Estimated Creatinine Clearance: 121.8 mL/min (by C-G formula based on SCr of 0.82 mg/dL).   Medical History: History reviewed. No pertinent past medical history.  Medications:  No anticoagulation prior to admission per my chart review  Assessment: Patient is a 51 y/o M with medical history including asthma who was BIBEMS after out of hospital cardiac arrest with ROSC after estimated downtime of 10-12 minutes. Rhythm Vfib upon EMS arrival s/p defibrillation x 2. Of note, patient was seen in the ED 5/25 with chest pain but given normal EKG, normal troponin, and no signs of acute distress, patient was discharged home. Pharmacy consulted to initiate and manage IV heparin for suspected ACS.  Baseline aPTT and PT-INR within normal limits. Baseline CBC overall normal.   Goal of Therapy:  Heparin level 0.3-0.7 units/ml Monitor platelets by anticoagulation protocol: Yes   Date/Time Level  Comment 6/9@1831  HL 0.55 Therapeutic  x1 6/10@0056     HL 0.54           Therapeutic X 2  6/11@0509     HL 0.20           SUBtherapeutic  6/11@1336  HL 0.17  Plan:  6/11@1336  HL 0.17, SUBtherapeutic Will order heparin 2500 units IV X 1 bolus and increase drip rate from 1150 units/hr to 1400 units/hr  Will recheck HL 6 hrs after rate change.  CBC daily  Veryl Abril A 10/27/2021 2:21 PM

## 2021-10-27 NOTE — Progress Notes (Signed)
Progress Note  Patient Name: Blake Moore Date of Encounter: 10/27/2021  [facility] Cardiologist: [facility]  Subjective   MRI completed yesterday, no acute findings Now extubated, verbal Alert, no complaints On discussion of his events leading to hospitalization, " I went to sleep" Telemetry reviewed, no significant arrhythmia  Inpatient Medications    Scheduled Meds:  aspirin  81 mg Per Tube Daily   atorvastatin  80 mg Per Tube Daily   Chlorhexidine Gluconate Cloth  6 each Topical Q0600   pantoprazole sodium  40 mg Per Tube Daily   polyethylene glycol  17 g Per Tube Daily   sodium chloride flush  10-40 mL Intracatheter Q12H   Continuous Infusions:  ampicillin-sulbactam (UNASYN) IV Stopped (10/27/21 0622)   heparin 1,150 Units/hr (10/27/21 0644)   lactated ringers 75 mL/hr at 10/26/21 1945   potassium PHOSPHATE IVPB (in mmol) 30 mmol (10/27/21 0833)   PRN Meds: acetaminophen, midazolam, midazolam, ondansetron (ZOFRAN) IV, sodium chloride flush   Vital Signs    Vitals:   10/27/21 0900 10/27/21 1000 10/27/21 1100 10/27/21 1200  BP: 136/79 (!) 142/84 130/80 (!) 142/78  Pulse: (!) 102 99 (!) 102 97  Resp: (!) 21 (!) 22 20 (!) 21  Temp:      TempSrc:      SpO2: 94% 90% 95%   Weight:      Height:        Intake/Output Summary (Last 24 hours) at 10/27/2021 1409 Last data filed at 10/27/2021 0644 Gross per 24 hour  Intake 3185.37 ml  Output 1950 ml  Net 1235.37 ml      10/25/2021    5:00 AM 10/25/2021   12:49 AM 10/10/2021   10:51 AM  Last 3 Weights  Weight (lbs) 187 lb 6.3 oz 187 lb 8 oz 180 lb  Weight (kg) 85 kg 85.049 kg 81.647 kg      Telemetry    Normal sinus rhythm- Personally Reviewed  ECG     - Personally Reviewed  Physical Exam   Constitutional:  oriented to person, place, and time. No distress.  HENT:  Head: Grossly normal Eyes:  no discharge. No scleral icterus.  Neck: No JVD, no carotid bruits  Cardiovascular: Regular rate and  rhythm, no murmurs appreciated Pulmonary/Chest: Clear to auscultation bilaterally, no wheezes or rails Abdominal: Soft.  no distension.  no tenderness.  Musculoskeletal: Normal range of motion Neurological:  normal muscle tone. Coordination normal. No atrophy Skin: Skin warm and dry Psychiatric: normal affect, pleasant  Labs    High Sensitivity Troponin:   Recent Labs  Lab 10/10/21 1059 10/25/21 0049 10/25/21 0318 10/25/21 0920 10/25/21 1115  TROPONINIHS <2 16 477* 1,406* 1,437*     Chemistry Recent Labs  Lab 10/25/21 0049 10/25/21 0615 10/25/21 0920 10/26/21 0521 10/27/21 0509  NA 140  --  140 140 137  K 2.7*  --  3.9 3.6 3.8  CL 107  --  110 110 106  CO2 19*  --  25 25 28   GLUCOSE 242*  --  104* 104* 129*  BUN 18  --  19 17 16   CREATININE 1.09  --  0.84 0.69 0.82  CALCIUM 8.3*  --  7.9* 8.4* 7.8*  MG 2.3 2.1  --  2.0 2.0  PROT 7.1  --  6.1*  --   --   ALBUMIN 3.7  --  3.2*  --   --   AST 126*  --  193*  --   --  ALT 101*  --  99*  --   --   ALKPHOS 93  --  66  --   --   BILITOT 0.3  --  0.4  --   --   GFRNONAA >60  --  >60 >60 >60  ANIONGAP 14  --  5 5 3*    Lipids  Recent Labs  Lab 10/25/21 1115 10/26/21 0521  CHOL 124  --   TRIG 38 73  HDL 41  --   LDLCALC 75  --   CHOLHDL 3.0  --     Hematology Recent Labs  Lab 10/25/21 0920 10/26/21 0521 10/27/21 0509  WBC 9.1 17.3* 12.2*  RBC 4.47 4.96 3.96*  HGB 11.9* 13.1 10.5*  HCT 37.7* 42.1 33.7*  MCV 84.3 84.9 85.1  MCH 26.6 26.4 26.5  MCHC 31.6 31.1 31.2  RDW 12.6 12.8 13.1  PLT 275 287 226   Thyroid  Recent Labs  Lab 10/25/21 0049  TSH 0.134*    BNP Recent Labs  Lab 10/25/21 0049  BNP 25.5    DDimer No results for input(s): "DDIMER" in the last 168 hours.   Radiology    MR BRAIN WO CONTRAST  Result Date: 10/26/2021 CLINICAL DATA:  Anoxic brain damage.  Cardiac arrest. EXAM: MRI HEAD WITHOUT CONTRAST TECHNIQUE: Multiplanar, multiecho pulse sequences of the brain and surrounding  structures were obtained without intravenous contrast. COMPARISON:  Head CT 10/25/2021 FINDINGS: Brain: There is no evidence of an acute infarct, intracranial hemorrhage, mass, midline shift, or extra-axial fluid collection. The ventricles and sulci are normal. The brain is normal in signal. Vascular: Major intracranial vascular flow voids are preserved. Skull and upper cervical spine: Unremarkable bone marrow signal. Sinuses/Orbits: Unremarkable orbits. Minimal mucosal thickening in the paranasal sinuses. Small bilateral mastoid effusions. Other: None. IMPRESSION: Unremarkable appearance of the brain. Electronically Signed   By: Blake Moore M.D.   On: 10/26/2021 16:52   EEG adult  Result Date: 10/26/2021 Blake Jack, MD     10/26/2021 12:50 PM Routine EEG Report Blake Moore is a 51 y.o. male with a history of cardiac arrest who is undergoing an EEG to evaluate for seizures. Report: This EEG was acquired with electrodes placed according to the International 10-20 electrode system (including Fp1, Fp2, F3, F4, C3, C4, P3, P4, O1, O2, T3, T4, T5, T6, A1, A2, Fz, Cz, Pz). The following electrodes were missing or displaced: none. There was no clear occipital dominant rhythm. The best background was continuous at 11-12 Hz. Some sleep spindles were identified. There was no focal slowing. There were no interictal epileptiform discharges. There were no electrographic seizures identified. Photic stimulation and hyperventilation were not performed. Impression: This EEG was obtained while intubated and sedated on propofol and fentanyl and shows no abnormalities. Blake Monks, MD [facility] [phone] If 7pm- 7am, please page neurology on call as listed in [place].   DG Chest Port 1 View  Result Date: 10/26/2021 CLINICAL DATA:  Intubation EXAM: PORTABLE CHEST 1 VIEW COMPARISON:  Yesterday FINDINGS: Endotracheal tube with tip at the clavicular heads. Left IJ line with tip at the SVC. The enteric tube  tip and side-port reaches the stomach. Hazy opacity at the right more than left base, progressed especially on the right. No visible effusion or pneumothorax. Normal heart size. Artifact from EKG leads. IMPRESSION: 1. Unremarkable hardware. 2. Atelectasis at the lung bases by recent chest CT. Aeration has worsened on the right since yesterday. Electronically Signed   By: Roderic Palau  Watts M.D.   On: 10/26/2021 05:44   DG Chest Port 1 View  Result Date: 10/25/2021 CLINICAL DATA:  Central venous catheter placement EXAM: PORTABLE CHEST 1 VIEW COMPARISON:  Previous studies including the examination done earlier today FINDINGS: There is interval placement of left IJ central venous catheter with its tip in the superior vena cava. Tip of endotracheal tube is 6 cm above the carina. Enteric tube is noted traversing the esophagus. Central pulmonary vessels are prominent. Increased interstitial markings are seen in the parahilar regions and lower lung fields, more so in medial left lower lung fields. There is no pleural effusion or pneumothorax. IMPRESSION: Tip of left IJ central venous catheter is seen in the superior vena cava. There is no pneumothorax. Central pulmonary vessels are prominent. Increased interstitial markings are seen in the both parahilar regions and both lower lung fields suggesting pulmonary edema or interstitial pneumonia. Electronically Signed   By: Elmer Picker M.D.   On: 10/25/2021 15:09    Cardiac Studies   Echocardiogram  1. Left ventricular ejection fraction, by estimation, is 45 to 50%. The  left ventricle has mildly decreased function. The left ventricle  demonstrates global hypokinesis. Left ventricular diastolic parameters  were normal.   2. Right ventricular systolic function is low normal. The right  ventricular size is normal. Tricuspid regurgitation signal is inadequate  for assessing PA pressure.   3. The mitral valve is normal in structure. Trivial mitral valve   regurgitation. No evidence of mitral stenosis.   4. The aortic valve is tricuspid. Aortic valve regurgitation is not  visualized. No aortic stenosis is present.   5. The inferior vena cava is dilated in size with >50% respiratory  variability, suggesting right atrial pressure of 8 mmHg.   Patient Profile     Blake Moore is a 51 y.o. male with no significant medical history who is being seen 10/25/2021 for the evaluation of cardiac arrest   Assessment & Plan    Out of hospital cardiac arrest Etiology unclear Initial rhythm was ventricular fibrillation Echocardiogram shows mildly reduced LV systolic function but no clear wall motion abnormalities.   -Troponin peaking 1400 --aspirin, statin, he has completed 48 hours heparin -We have started discussion concerning cardiac catheterization as part of his cardiac work-up -If no significant coronary disease, he may benefit from cardiac MRI and EP evaluation -Discussed whether to proceed with cardiac catheterization tomorrow, he was noncommittal, slightly confused  Encephalopathy Postarrest, EEG completed, MRI no acute findings Extubated, still slightly confused    Total encounter time more than 50 minutes  Greater than 50% was spent in counseling and coordination of care with the patient    For questions or updates, please contact [place] Please consult [email or web address] for contact info under        Signed, Ida Rogue, MD  10/27/2021, 2:09 PM

## 2021-10-28 ENCOUNTER — Ambulatory Visit: Admit: 2021-10-28 | Payer: 59 | Admitting: Cardiovascular Disease

## 2021-10-28 ENCOUNTER — Encounter
Admission: EM | Disposition: A | Discharge status: Home / Self Care | Payer: Self-pay | Source: Home / Self Care | Attending: Internal Medicine

## 2021-10-28 ENCOUNTER — Other Ambulatory Visit: Payer: Self-pay

## 2021-10-28 DIAGNOSIS — J9601 Acute respiratory failure with hypoxia: Secondary | ICD-10-CM

## 2021-10-28 DIAGNOSIS — I469 Cardiac arrest, cause unspecified: Secondary | ICD-10-CM | POA: Diagnosis not present

## 2021-10-28 DIAGNOSIS — I4901 Ventricular fibrillation: Secondary | ICD-10-CM | POA: Diagnosis not present

## 2021-10-28 DIAGNOSIS — G9341 Metabolic encephalopathy: Secondary | ICD-10-CM | POA: Diagnosis not present

## 2021-10-28 DIAGNOSIS — J690 Pneumonitis due to inhalation of food and vomit: Secondary | ICD-10-CM | POA: Diagnosis not present

## 2021-10-28 DIAGNOSIS — J9602 Acute respiratory failure with hypercapnia: Secondary | ICD-10-CM

## 2021-10-28 DIAGNOSIS — E876 Hypokalemia: Secondary | ICD-10-CM

## 2021-10-28 HISTORY — PX: LEFT HEART CATH AND CORONARY ANGIOGRAPHY: CATH118249

## 2021-10-28 LAB — MAGNESIUM: Magnesium: 1.7 mg/dL (ref 1.7–2.4)

## 2021-10-28 LAB — CULTURE, RESPIRATORY W GRAM STAIN

## 2021-10-28 LAB — CBC
HCT: 34.1 % — ABNORMAL LOW (ref 39.0–52.0)
Hemoglobin: 10.6 g/dL — ABNORMAL LOW (ref 13.0–17.0)
MCH: 26.4 pg (ref 26.0–34.0)
MCHC: 31.1 g/dL (ref 30.0–36.0)
MCV: 85 fL (ref 80.0–100.0)
Platelets: 243 10*3/uL (ref 150–400)
RBC: 4.01 MIL/uL — ABNORMAL LOW (ref 4.22–5.81)
RDW: 12.7 % (ref 11.5–15.5)
WBC: 12 10*3/uL — ABNORMAL HIGH (ref 4.0–10.5)
nRBC: 0 % (ref 0.0–0.2)

## 2021-10-28 LAB — CULTURE, BLOOD (ROUTINE X 2)

## 2021-10-28 LAB — HEPARIN LEVEL (UNFRACTIONATED)
Heparin Unfractionated: 0.24 IU/mL — ABNORMAL LOW (ref 0.30–0.70)
Heparin Unfractionated: 0.33 IU/mL (ref 0.30–0.70)

## 2021-10-28 LAB — BASIC METABOLIC PANEL
Anion gap: 7 (ref 5–15)
BUN: 14 mg/dL (ref 6–20)
CO2: 27 mmol/L (ref 22–32)
Calcium: 8.5 mg/dL — ABNORMAL LOW (ref 8.9–10.3)
Chloride: 108 mmol/L (ref 98–111)
Creatinine, Ser: 0.74 mg/dL (ref 0.61–1.24)
GFR, Estimated: >60 mL/min (ref >60)
Glucose, Bld: 109 mg/dL — ABNORMAL HIGH (ref 70–99)
Potassium: 3.4 mmol/L — ABNORMAL LOW (ref 3.5–5.1)
Sodium: 142 mmol/L (ref 135–145)

## 2021-10-28 LAB — PHOSPHORUS: Phosphorus: 2.5 mg/dL (ref 2.5–4.6)

## 2021-10-28 SURGERY — LEFT HEART CATH AND CORONARY ANGIOGRAPHY
Anesthesia: Moderate Sedation

## 2021-10-28 MED ORDER — ASPIRIN 81 MG PO CHEW
CHEWABLE_TABLET | ORAL | Status: AC
  Filled 2021-10-28: qty 1

## 2021-10-28 MED ORDER — HEPARIN BOLUS VIA INFUSION
1200.0000 [IU] | Freq: Once | INTRAVENOUS | Status: AC
  Administered 2021-10-28: 1200 [IU] via INTRAVENOUS
  Filled 2021-10-28: qty 1200

## 2021-10-28 MED ORDER — ENOXAPARIN SODIUM 40 MG/0.4ML IJ SOSY
40.0000 mg | PREFILLED_SYRINGE | INTRAMUSCULAR | Status: DC
  Administered 2021-10-29 – 2021-10-31 (×3): 40 mg via SUBCUTANEOUS
  Filled 2021-10-28 (×5): qty 0.4

## 2021-10-28 MED ORDER — SODIUM CHLORIDE 0.9% FLUSH: 3.0000 mL | INTRAVENOUS | Status: DC | PRN

## 2021-10-28 MED ORDER — SODIUM CHLORIDE 0.9 % IV SOLN: 250.0000 mL | INTRAVENOUS | Status: DC | PRN

## 2021-10-28 MED ORDER — MAGNESIUM SULFATE 2 GM/50ML IV SOLN
2.0000 g | Freq: Once | INTRAVENOUS | Status: AC
  Administered 2021-10-28: 2 g via INTRAVENOUS
  Filled 2021-10-28: qty 50

## 2021-10-28 MED ORDER — VERAPAMIL HCL 2.5 MG/ML IV SOLN
INTRAVENOUS | Status: AC
  Filled 2021-10-28: qty 2

## 2021-10-28 MED ORDER — ASPIRIN 81 MG PO CHEW
81.0000 mg | CHEWABLE_TABLET | ORAL | Status: AC
  Administered 2021-10-28: 81 mg via ORAL

## 2021-10-28 MED ORDER — ENSURE ENLIVE PO LIQD
237.0000 mL | Freq: Three times a day (TID) | ORAL | Status: DC
  Administered 2021-10-28 – 2021-10-31 (×7): 237 mL via ORAL

## 2021-10-28 MED ORDER — HEPARIN SODIUM (PORCINE) 1000 UNIT/ML IJ SOLN
INTRAMUSCULAR | Status: AC
  Filled 2021-10-28: qty 10

## 2021-10-28 MED ORDER — POLYETHYLENE GLYCOL 3350 17 G PO PACK
17.0000 g | PACK | Freq: Every day | ORAL | Status: DC
  Administered 2021-10-31: 17 g via ORAL
  Filled 2021-10-28: qty 1

## 2021-10-28 MED ORDER — IOHEXOL 300 MG/ML  SOLN
INTRAMUSCULAR | Status: DC | PRN
  Administered 2021-10-28: 35 mL

## 2021-10-28 MED ORDER — MIDAZOLAM HCL 2 MG/2ML IJ SOLN
INTRAMUSCULAR | Status: AC
  Filled 2021-10-28: qty 2

## 2021-10-28 MED ORDER — MIDAZOLAM HCL 2 MG/2ML IJ SOLN
INTRAMUSCULAR | Status: DC | PRN
  Administered 2021-10-28: 1 mg via INTRAVENOUS

## 2021-10-28 MED ORDER — FENTANYL CITRATE (PF) 100 MCG/2ML IJ SOLN
INTRAMUSCULAR | Status: AC
  Filled 2021-10-28: qty 2

## 2021-10-28 MED ORDER — HEPARIN (PORCINE) IN NACL 2000-0.9 UNIT/L-% IV SOLN
INTRAVENOUS | Status: DC | PRN
  Administered 2021-10-28: 1000 mL

## 2021-10-28 MED ORDER — QUETIAPINE FUMARATE 25 MG PO TABS
25.0000 mg | ORAL_TABLET | Freq: Once | ORAL | Status: AC
Start: 2021-10-28 — End: 2021-10-28
  Administered 2021-10-28: 25 mg via ORAL
  Filled 2021-10-28: qty 1

## 2021-10-28 MED ORDER — SODIUM CHLORIDE 0.9% FLUSH
3.0000 mL | Freq: Two times a day (BID) | INTRAVENOUS | Status: DC
  Administered 2021-10-28 – 2021-10-31 (×5): 3 mL via INTRAVENOUS

## 2021-10-28 MED ORDER — GUAIFENESIN-DM 100-10 MG/5ML PO SYRP
5.0000 mL | ORAL_SOLUTION | Freq: Four times a day (QID) | ORAL | Status: DC | PRN
  Administered 2021-10-28 – 2021-10-30 (×2): 5 mL via ORAL
  Filled 2021-10-28 (×3): qty 5

## 2021-10-28 MED ORDER — LIDOCAINE HCL 1 % IJ SOLN
INTRAMUSCULAR | Status: AC
  Filled 2021-10-28: qty 20

## 2021-10-28 MED ORDER — VERAPAMIL HCL 2.5 MG/ML IV SOLN
INTRAVENOUS | Status: DC | PRN
  Administered 2021-10-28: 2.5 mg via INTRA_ARTERIAL

## 2021-10-28 MED ORDER — FENTANYL CITRATE (PF) 100 MCG/2ML IJ SOLN
INTRAMUSCULAR | Status: DC | PRN
  Administered 2021-10-28: 25 ug via INTRAVENOUS

## 2021-10-28 MED ORDER — HEPARIN (PORCINE) IN NACL 1000-0.9 UT/500ML-% IV SOLN
INTRAVENOUS | Status: AC
  Filled 2021-10-28: qty 1000

## 2021-10-28 MED ORDER — SODIUM CHLORIDE 0.9 % WEIGHT BASED INFUSION
1.0000 mL/kg/h | INTRAVENOUS | Status: DC
  Administered 2021-10-28: 1 mL/kg/h via INTRAVENOUS

## 2021-10-28 MED ORDER — POTASSIUM CHLORIDE 10 MEQ/100ML IV SOLN
10.0000 meq | INTRAVENOUS | Status: AC
  Administered 2021-10-28 (×2): 10 meq via INTRAVENOUS
  Filled 2021-10-28 (×2): qty 100

## 2021-10-28 MED ORDER — ADULT MULTIVITAMIN W/MINERALS CH
1.0000 | ORAL_TABLET | Freq: Every day | ORAL | Status: DC
  Administered 2021-10-29 – 2021-10-31 (×2): 1 via ORAL
  Filled 2021-10-28 (×2): qty 1

## 2021-10-28 MED ORDER — SODIUM CHLORIDE 0.9 % WEIGHT BASED INFUSION
3.0000 mL/kg/h | INTRAVENOUS | Status: DC
  Administered 2021-10-28: 3 mL/kg/h via INTRAVENOUS

## 2021-10-28 MED ORDER — HEPARIN SODIUM (PORCINE) 1000 UNIT/ML IJ SOLN
INTRAMUSCULAR | Status: DC | PRN
  Administered 2021-10-28: 4000 [IU] via INTRAVENOUS

## 2021-10-28 MED ORDER — SODIUM CHLORIDE 0.9 % IV SOLN: INTRAVENOUS | Status: AC

## 2021-10-28 MED ORDER — SODIUM CHLORIDE 0.9% FLUSH
3.0000 mL | Freq: Two times a day (BID) | INTRAVENOUS | Status: DC
  Administered 2021-10-28 – 2021-10-29 (×3): 3 mL via INTRAVENOUS

## 2021-10-28 SURGICAL SUPPLY — 10 items
BAND ZEPHYR COMPRESS 30 LONG (HEMOSTASIS) ×1 IMPLANT
CATH INFINITI 5FR JK (CATHETERS) ×1 IMPLANT
DRAPE BRACHIAL (DRAPES) ×1 IMPLANT
GLIDESHEATH SLEND SS 6F .021 (SHEATH) ×1 IMPLANT
GUIDEWIRE INQWIRE 1.5J.035X260 (WIRE) IMPLANT
INQWIRE 1.5J .035X260CM (WIRE) ×2
PACK CARDIAC CATH (CUSTOM PROCEDURE TRAY) ×2 IMPLANT
PROTECTION STATION PRESSURIZED (MISCELLANEOUS) ×2
SET ATX SIMPLICITY (MISCELLANEOUS) ×1 IMPLANT
STATION PROTECTION PRESSURIZED (MISCELLANEOUS) IMPLANT

## 2021-10-28 NOTE — Assessment & Plan Note (Signed)
Out of hospital cardiac arrest with unknown etiology.  Initial rhythm reportedly was V-fib Echo showed mild newly reduced LV systolic function.  Cardiac cath today showed normal coronaries-cardiology considering EP eval Troponin peaked at 1400 Continue aspirin, statin.  Completed 48 hours of heparin Patient does not recall events preceding cardiac arrest

## 2021-10-28 NOTE — Assessment & Plan Note (Signed)
Right lower lobe on chest x-ray.  Likely aspiration related.  Continue IV Unasyn

## 2021-10-28 NOTE — Progress Notes (Signed)
  Progress Note   Patient: Blake Moore [id number] DOB: [birth year] DOA: 10/25/2021     3 DOS: the patient was seen and examined on 10/28/2021   Brief hospital course: 51 year old male admitted status post cardiac arrest   6/9 admitted to ICU for cardiac arrest 6/10 severe resp failure s/p cardiac arrest 6/11 extubated 6/12: TRH picked up.  Cardiac cath shows no coronary disease.   Assessment and Plan: * Cardiac arrest with ventricular fibrillation ([place]) Out of hospital cardiac arrest with unknown etiology.  Initial rhythm reportedly was V-fib Echo showed mild newly reduced LV systolic function.  Cardiac cath today showed normal coronaries-cardiology considering EP eval Troponin peaked at 1400 Continue aspirin, statin.  Completed 48 hours of heparin Patient does not recall events preceding cardiac arrest   Hypokalemia Replete and recheck, check magnesium  Aspiration pneumonia ([place]) Right lower lobe on chest x-ray.  Likely aspiration related.  Continue IV Unasyn  Acute respiratory failure with hypoxia and hypercapnia (HCC) Required brief.  On ventilation from 6/9 till 6/11 when he was extubated.  He is on room air now.  Acute metabolic encephalopathy MRI and EEG within normal limits.  Patient does not recall events preceding cardiac arrest.  His encephalopathy seem to be resolved.        Subjective: Denies any chest pain or shortness of breath.  Waiting for cardiac cath as he does want to eat  Physical Exam: Vitals:   10/28/21 1615 10/28/21 1630 10/28/21 1644 10/28/21 1807  BP: 121/79 136/73 126/76 (!) 111/59  Pulse: 81 74 70 100  Resp: (!) 25  (!) 28 20  Temp:   98.7 F (37.1 C)   TempSrc:   Oral   SpO2: 97% 96% 97% 100%  Weight:      Height:       51 year old male lying in the bed comfortably without any acute distress Lungs clear to auscultation bilaterally Cardiovascular regular rate and rhythm, no murmurs Abdomen soft, benign Neuro alert and oriented,  nonfocal Skin no rash or lesion Psychiatry normal mood and affect  Data Reviewed:  Potassium 3.4, cardiac cath showed normal coronaries  Family Communication: None  Disposition: Status is: Inpatient Remains inpatient appropriate because: Further cardiac work-up s/p cardiac arrest out in the field   Planned Discharge Destination: Home    DVT prophylaxis-Lovenox Time spent: 35 minutes  Author: Max Sane, MD 10/28/2021 7:43 PM  For on call review [email or web address].

## 2021-10-28 NOTE — Assessment & Plan Note (Signed)
Replete and recheck, check magnesium 

## 2021-10-28 NOTE — TOC Progression Note (Signed)
Transition of Care [facility]) - Progression Note    Patient Details  Name: Blake Moore MRN: [id number] Date of Birth: [birth year]  [facility] CM/SW Contact  Shelbie Hutching, RN Phone Number: 10/28/2021, 11:54 AM  Clinical Narrative:    Patient is awake and alert, says he is hungry.  Plan is for cardiac cath today, after cath he should be able to eat. RNCM met with patient at the bedside, he reports that he is from home with his significant other and he has 2 small children 27 and 10 years old.  He has 2 jobs one at [employer] and the other the [employer].  He drives. He does not currently have a PCP and will need to be set up with one at discharge.    TOC will cont to follow.     Expected Discharge Plan: Home/Self Care Barriers to Discharge: Continued Medical Work up  Expected Discharge Plan and Services Expected Discharge Plan: Home/Self Care   Discharge Planning Services: CM Consult   Living arrangements for the past 2 months: Apartment                                       Social Determinants of Health (SDOH) Interventions    Readmission Risk Interventions     No data to display

## 2021-10-28 NOTE — Progress Notes (Signed)
Nutrition Follow Up Note   DOCUMENTATION CODES:   Not applicable  INTERVENTION:   Ensure Enlive po TID with diet advancement, each supplement provides 350 kcal and 20 grams of protein.  MVI po daily   NUTRITION DIAGNOSIS:   Inadequate oral intake related to inability to eat (pt ventilated and sedated) as evidenced by NPO status.  GOAL:   Patient will meet greater than or equal to 90% of their needs -previously met with tube feeds   MONITOR:   Diet advancement, Labs, Weight trends, Skin, I & O's    ASSESSMENT:   51 y/o male with h/o marijuna use who is admitted with cardiac arrest and possible seizures.  Met with pt in room today. Pt extubated yesterday. Pt reports that He is starving and ready to eat today. Pt is NPO for left heart cath. Plan is to advance pt's diet after procedure. RD will add supplements to help pt meet his estimated needs. Pt is agreeable to drinking whatever supplements he needs. Per chart, pt is down 11lbs since admission but appears back at his UBW.   Medications reviewed and include: aspirin, protonix, unasyn, heaprin   Labs reviewed: K 3.4(L), P 2.5 wnl, Mg 1.7 wnl Cbgs- 104, 152, 208 x 24 hrs  Diet Order:   Diet Order             Diet NPO time specified  Diet effective midnight                  EDUCATION NEEDS:   No education needs have been identified at this time  Skin:  Skin Assessment: Reviewed RN Assessment  Last BM:  6/12- type 5  Height:   Ht Readings from Last 1 Encounters:  10/25/21 _0  (1.854 m)    Weight:   Wt Readings from Last 1 Encounters:  10/28/21 79.9 kg    Ideal Body Weight:  83.6 kg  BMI:  Body mass index is 23.24 kg/m.  Estimated Nutritional Needs:   Kcal:  2300-2600kcal/day  Protein:  115-130g/day  Fluid:  2.5-2.8L/day  Koleen Distance MS, RD, LDN Please refer to [facility] for RD and/or RD on-call/weekend/after hours pager

## 2021-10-28 NOTE — Progress Notes (Signed)
ANTICOAGULATION CONSULT NOTE  Pharmacy Consult for IV Heparin Indication: chest pain/ACS  Patient Measurements: Height: 6\' 1"  (185.4 cm) Weight: 85 kg (187 lb 6.3 oz) IBW/kg (Calculated) : 79.9 Heparin Dosing Weight: 85 kg  Labs: Recent Labs    10/25/21 0920 10/25/21 1115 10/25/21 1831 10/26/21 0521 10/27/21 0509 10/27/21 1336 10/27/21 2104 10/28/21 0453  HGB 11.9*  --   --  13.1 10.5*  --   --  10.6*  HCT 37.7*  --   --  42.1 33.7*  --   --  34.1*  PLT 275  --   --  287 226  --   --  243  HEPARINUNFRC  --   --    < >  --  0.20* 0.17* 0.27* 0.24*  CREATININE 0.84  --   --  0.69 0.82  --   --   --   TROPONINIHS 1,406* 1,437*  --   --   --   --   --   --    < > = values in this interval not displayed.    Estimated Creatinine Clearance: 121.8 mL/min (by C-G formula based on SCr of 0.82 mg/dL).   Medical History: History reviewed. No pertinent past medical history.  Medications:  No anticoagulation prior to admission per my chart review  Assessment: Patient is a 51 y/o M with medical history including asthma who was BIBEMS after out of hospital cardiac arrest with ROSC after estimated downtime of 10-12 minutes. Rhythm Vfib upon EMS arrival s/p defibrillation x 2. Of note, patient was seen in the ED 5/25 with chest pain but given normal EKG, normal troponin, and no signs of acute distress, patient was discharged home. Pharmacy consulted to initiate and manage IV heparin for suspected ACS.  Baseline aPTT and PT-INR within normal limits. Baseline CBC overall normal.   Goal of Therapy:  Heparin level 0.3-0.7 units/ml Monitor platelets by anticoagulation protocol: Yes   Date/Time Level  Comment 6/9@1831  HL 0.55 Therapeutic x1 6/10@0056     HL 0.54           Therapeutic X 2  6/11@0509     HL 0.20           SUBtherapeutic  6/11@1336  HL 0.17 6/11@2107     HL 0.27            SUBtherapeutic  6/12@0453     HL 0.24            SUBtherapeutic   Plan:  6/12: HL @ 0453 = 0.24,  subtherapeutic Will order heparin 1200 units IV X 1 and increase drip rate to 1700 units/hr.  Will recheck HL 6 hrs after rate change.  CBC daily  Citlally Captain D 10/28/2021 5:30 AM

## 2021-10-28 NOTE — Consult Note (Signed)
PHARMACY CONSULT NOTE  Pharmacy Consult for Electrolyte Monitoring and Replacement   Recent Labs: Potassium (mmol/L)  Date Value  10/28/2021 3.4 (L)   Magnesium (mg/dL)  Date Value  67/34/1937 1.7   Calcium (mg/dL)  Date Value  90/24/0973 8.5 (L)   Albumin (g/dL)  Date Value  53/29/9242 3.2 (L)   Phosphorus (mg/dL)  Date Value  68/34/1962 2.5   Sodium (mmol/L)  Date Value  10/28/2021 142   Assessment: Patient is a 51 y/o M with medical history including asthma who was BIBEMS postcardiac arrest. Patient is currently admitted to the ICU where he is intubated, sedated, and on mechanical ventilation. Pharmacy consulted to assist with electrolyte monitoring and replacement as indicated.  NPO, pending cardiac catheterization MIVF: LR at 75 cc/hr  Goal of Therapy:  K > 4 Mg > 2  Plan:  --K 3.4, IV Kcl 10 mEq x 2. May give additional dose of PO Kcl 40 mEq x 1 later today if able to tolerate PO after cardiac catheterization --Mg 1.7, IV magnesium sulfate 2 g x 1 --Follow-up electrolytes with AM labs tomorrow   Tressie Ellis 10/28/2021 8:02 AM

## 2021-10-28 NOTE — Hospital Course (Addendum)
51 year old male admitted status post cardiac arrest   6/9 admitted to ICU for cardiac arrest 6/10 severe resp failure s/p cardiac arrest 6/11 extubated 6/12: TRH picked up.  Cardiac cath shows no coronary disease. 6/13: Patient was anxious and restless last night with good response to Seroquel, EP planning flecainide trial and likely ICD placement tomorrow

## 2021-10-28 NOTE — Assessment & Plan Note (Signed)
MRI and EEG within normal limits.  Patient does not recall events preceding cardiac arrest.  His encephalopathy seem to be resolved.

## 2021-10-28 NOTE — Consult Note (Signed)
ELECTROPHYSIOLOGY CONSULT NOTE  Patient ID: Blake Moore, MRN: [id number], DOB/AGE: Aug 15, 1970 51 y.o. Admit date: 10/25/2021 Date of Consult: 10/28/2021  Primary Physician: Pcp, No Primary Cardiologist: Blake Moore is a 51 y.o. male who is being seen today for the evaluation of cardiac arrest at the request of Blake.   Chief Complaint: aborted cardiac arrest   HPI Blake Moore is a 51 y.o. male is seen followup [facility] from which he has made a remarkable neurological recovery  No prior hx of syncope he says, but he also does not remember having come to the hospital one week prior to his arrest for chest pain   He denies exercise intolerance     The patient's chart describes The patient was asleep but when his significant other came to bed, the patient jumped out of bed and was incoherent.  He went to the kitchen and started stumbling and fell forward and hit the ground.  CPR was initiated and EMS were called. VF detected by AED and shocked with ROSC and then recurrent VF  according to the EMS narrative, he was complaining of chest pain at the time of his arrest    DATE TEST EF   6/23 Echo   45-50 %   6/23 LHC 45-50% No CAD        Date Cr K Hgb  5/23 0.89 4.3 13.4  6/23 1.09  3.9 <<2.7 ( post arrest) 13.3    History reviewed. No pertinent past medical history.   Surgical History: History reviewed. No pertinent surgical history.   Home Meds: Prior to Admission medications   Medication Sig Start Date End Date Taking? Authorizing Provider  albuterol (VENTOLIN HFA) 108 (90 Base) MCG/ACT inhaler Inhale 2 puffs into the lungs every 6 (six) hours as needed for wheezing or shortness of breath. Patient not taking: Reported on 10/25/2021 05/27/19   Blake Eddy, MD    Inpatient Medications:   aspirin       Chlorhexidine Gluconate Cloth  6 each Topical Q0600   [START ON 10/29/2021] enoxaparin (LOVENOX) injection  40 mg Subcutaneous Q24H   feeding supplement  237 mL Oral TID  BM   [START ON 10/29/2021] multivitamin with minerals  1 tablet Oral Daily   pantoprazole (PROTONIX) IV  40 mg Intravenous Q24H   [START ON 10/29/2021] polyethylene glycol  17 g Oral Daily   sodium chloride flush  10-40 mL Intracatheter Q12H   sodium chloride flush  3 mL Intravenous Q12H   sodium chloride flush  3 mL Intravenous Q12H      Allergies: No Known Allergies  Social History   Socioeconomic History   Marital status: Single    Spouse name: Not on file   Number of children: Not on file   Years of education: Not on file   Highest education level: Not on file  Occupational History   Not on file  Tobacco Use   Smoking status: Some Days    Types: Cigarettes   Smokeless tobacco: Never  Substance and Sexual Activity   Alcohol use: Yes    Alcohol/week: 4.0 standard drinks of alcohol    Types: 4 Cans of beer per week    Comment: everyday, couple of beers   Drug use: Not Currently    Types: Marijuana   Sexual activity: Not on file  Other Topics Concern   Not on file  Social History Narrative   Not on file   Social Determinants of Health   Financial Resource  Strain: Not on file  Food Insecurity: Not on file  Transportation Needs: Not on file  Physical Activity: Not on file  Stress: Not on file  Social Connections: Not on file  Intimate Partner Violence: Not on file     History reviewed. No pertinent family history.   ROS:  Please see the history of present illness.     All other systems reviewed and negative.    Physical Exam: Blood pressure (!) 111/59, pulse 100, temperature 98.7 F (37.1 C), temperature source Oral, resp. rate 20, height 6\' 1"  (1.854 m), weight 79.9 kg, SpO2 100 %. General: Well developed, well nourished male in no acute distress. Head: Normocephalic, atraumatic, sclera non-icteric, no xanthomas, nares are without discharge. EENT: normal Lymph Nodes:  none Back: without scoliosis/kyphosis, no CVA tendersness Neck: Negative for carotid  bruits. JVD not elevated. Lungs: Clear bilaterally to auscultation without wheezes, rales, or rhonchi. Breathing is unlabored. Heart: RRR with S1 S2. No murmur , rubs, or gallops appreciated. Abdomen: Soft, non-tender, non-distended with normoactive bowel sounds. No hepatomegaly. No rebound/guarding. No obvious abdominal masses. Msk:  Strength and tone appear normal for age. Extremities: No clubbing or cyanosis. No  edema.  Distal pedal pulses are 2+ and equal bilaterally. Skin: Warm and Dry Neuro: Alert and oriented X 3. CN III-XII intact Grossly normal sensory and motor function . Psych:  Responds to questions appropriately with a normal affect.      Labs: Cardiac Enzymes No results for input(s): "CKTOTAL", "CKMB", "TROPONINI" in the last 72 hours. CBC Lab Results  Component Value Date   WBC 12.0 (H) 10/28/2021   HGB 10.6 (L) 10/28/2021   HCT 34.1 (L) 10/28/2021   MCV 85.0 10/28/2021   PLT 243 10/28/2021   PROTIME: No results for input(s): "LABPROT", "INR" in the last 72 hours. Chemistry  Recent Labs  Lab 10/25/21 0920 10/26/21 0521 10/28/21 0453  NA 140   < > 142  K 3.9   < > 3.4*  CL 110   < > 108  CO2 25   < > 27  BUN 19   < > 14  CREATININE 0.84   < > 0.74  CALCIUM 7.9*   < > 8.5*  PROT 6.1*  --   --   BILITOT 0.4  --   --   ALKPHOS 66  --   --   ALT 99*  --   --   AST 193*  --   --   GLUCOSE 104*   < > 109*   < > = values in this interval not displayed.   Lipids Lab Results  Component Value Date   CHOL 124 10/25/2021   HDL 41 10/25/2021   LDLCALC 75 10/25/2021   TRIG 73 10/26/2021   BNP No results found for: "PROBNP" Thyroid Function Tests: No results for input(s): "TSH", "T4TOTAL", "T3FREE", "THYROIDAB" in the last 72 hours.  Invalid input(s): "FREET3"         EKG: Sinus V1 and V2 placed a second intercostal space, minimal ST segment elevation but again a broad QRS with downsloping ECG 5/23 sinus at 72 IVCD with a QRS duration of about 140 ms  a broad R prime in lead V2 and somewhat downsloping ECG 5/22 sinus at 69 Intervals 18/12/37 with a broad R prime now in lead V3 and a small R prime in lead V1   Assessment and Plan:  Ventricular fibrillation  Abnormal ECG  Chest Pain  Catheterization without coronary disease  The  patient has survived cardiac arrest.  He will need an ICD.  I have reviewed this with him.  As to the cause of his cardiac arrest continue to explore.  Arrest associated chest pain with chest pain prompting evaluation in ER the week before, not withstanding the absence of troponins, suggest spasm as a cause.  In this regard, aspirin and beta-blockers would be contraindicated, calcium blockers statins and nitrates are the foundation of therapy.  I will stop the aspirin, start nifedipine and a statin.    cMRI may be clarifying  His ECG is striking in waves that I cannot quite explain.  There is a broad R wave in ST elevation in leads I and L and V2 and or V3 that suggests Brugada syndrome.  The fact that this event occurred at night would support that hypothesis.  We will undertake flecainide challenge.  I have reviewed this with him; I am not quite sure how much she will remember.  He is also informed that he should not drive for 6 months Sherryl Manges

## 2021-10-28 NOTE — Assessment & Plan Note (Signed)
Required brief.  On ventilation from 6/9 till 6/11 when he was extubated.  He is on room air now 

## 2021-10-28 NOTE — Progress Notes (Signed)
Progress Note  Patient Name: Blake Moore Date of Encounter: 10/28/2021  [facility] Cardiologist: [facility]  Subjective   The patient is awake, alert, and oriented today.  He reports intermittent chest pain the week preceding his cardiac arrest.  He does not remember the details of what happened before he had his cardiac arrest. He is feeling well today with no chest pain or shortness of breath.  Inpatient Medications    Scheduled Meds:  aspirin  81 mg Per Tube Daily   atorvastatin  80 mg Per Tube Daily   Chlorhexidine Gluconate Cloth  6 each Topical Q0600   pantoprazole (PROTONIX) IV  40 mg Intravenous Q24H   [START ON 10/29/2021] polyethylene glycol  17 g Oral Daily   sodium chloride flush  10-40 mL Intracatheter Q12H   Continuous Infusions:  ampicillin-sulbactam (UNASYN) IV 3 g (10/28/21 1225)   heparin 1,700 Units/hr (10/28/21 0700)   potassium chloride 10 mEq (10/28/21 1137)   PRN Meds: acetaminophen, guaiFENesin-dextromethorphan, ondansetron (ZOFRAN) IV, sodium chloride flush   Vital Signs    Vitals:   10/28/21 0400 10/28/21 0500 10/28/21 0600 10/28/21 0700  BP: (!) 109/52  124/62 110/71  Pulse: 88 (!) 102 92 94  Resp: (!) 25 (!) 30 (!) 24 (!) 27  Temp:    99.1 F (37.3 C)  TempSrc:    Oral  SpO2: 93% 96% 94% 93%  Weight:  79.9 kg    Height:        Intake/Output Summary (Last 24 hours) at 10/28/2021 1233 Last data filed at 10/28/2021 0900 Gross per 24 hour  Intake 264.86 ml  Output 4350 ml  Net -4085.14 ml       10/28/2021    5:00 AM 10/25/2021    5:00 AM 10/25/2021   12:49 AM  Last 3 Weights  Weight (lbs) 176 lb 2.4 oz 187 lb 6.3 oz 187 lb 8 oz  Weight (kg) 79.9 kg 85 kg 85.049 kg      Telemetry    Normal sinus rhythm- Personally Reviewed  ECG     - Personally Reviewed  Physical Exam   Constitutional:  oriented to person, place, and time. No distress.  HENT:  Head: Grossly normal Eyes:  no discharge. No scleral icterus.  Neck: No  JVD, no carotid bruits  Cardiovascular: Regular rate and rhythm, no murmurs appreciated Pulmonary/Chest: Clear to auscultation bilaterally, no wheezes or rails Abdominal: Soft.  no distension.  no tenderness.  Musculoskeletal: Normal range of motion Neurological:  normal muscle tone. Coordination normal. No atrophy Skin: Skin warm and dry Psychiatric: normal affect, pleasant  Labs    High Sensitivity Troponin:   Recent Labs  Lab 10/10/21 1059 10/25/21 0049 10/25/21 0318 10/25/21 0920 10/25/21 1115  TROPONINIHS <2 16 477* 1,406* 1,437*      Chemistry Recent Labs  Lab 10/25/21 0049 10/25/21 0615 10/25/21 0920 10/26/21 0521 10/27/21 0509 10/28/21 0453  NA 140  --  140 140 137 142  K 2.7*  --  3.9 3.6 3.8 3.4*  CL 107  --  110 110 106 108  CO2 19*  --  25 25 28 27   GLUCOSE 242*  --  104* 104* 129* 109*  BUN 18  --  19 17 16 14   CREATININE 1.09  --  0.84 0.69 0.82 0.74  CALCIUM 8.3*  --  7.9* 8.4* 7.8* 8.5*  MG 2.3   < >  --  2.0 2.0 1.7  PROT 7.1  --  6.1*  --   --   --  ALBUMIN 3.7  --  3.2*  --   --   --   AST 126*  --  193*  --   --   --   ALT 101*  --  99*  --   --   --   ALKPHOS 93  --  66  --   --   --   BILITOT 0.3  --  0.4  --   --   --   GFRNONAA >60  --  >60 >60 >60 >60  ANIONGAP 14  --  5 5 3* 7   < > = values in this interval not displayed.     Lipids  Recent Labs  Lab 10/25/21 1115 10/26/21 0521  CHOL 124  --   TRIG 38 73  HDL 41  --   LDLCALC 75  --   CHOLHDL 3.0  --      Hematology Recent Labs  Lab 10/26/21 0521 10/27/21 0509 10/28/21 0453  WBC 17.3* 12.2* 12.0*  RBC 4.96 3.96* 4.01*  HGB 13.1 10.5* 10.6*  HCT 42.1 33.7* 34.1*  MCV 84.9 85.1 85.0  MCH 26.4 26.5 26.4  MCHC 31.1 31.2 31.1  RDW 12.8 13.1 12.7  PLT 287 226 243    Thyroid  Recent Labs  Lab 10/25/21 0049  TSH 0.134*     BNP Recent Labs  Lab 10/25/21 0049  BNP 25.5     DDimer No results for input(s): "DDIMER" in the last 168 hours.   Radiology     MR BRAIN WO CONTRAST  Result Date: 10/26/2021 CLINICAL DATA:  Anoxic brain damage.  Cardiac arrest. EXAM: MRI HEAD WITHOUT CONTRAST TECHNIQUE: Multiplanar, multiecho pulse sequences of the brain and surrounding structures were obtained without intravenous contrast. COMPARISON:  Head CT 10/25/2021 FINDINGS: Brain: There is no evidence of an acute infarct, intracranial hemorrhage, mass, midline shift, or extra-axial fluid collection. The ventricles and sulci are normal. The brain is normal in signal. Vascular: Major intracranial vascular flow voids are preserved. Skull and upper cervical spine: Unremarkable bone marrow signal. Sinuses/Orbits: Unremarkable orbits. Minimal mucosal thickening in the paranasal sinuses. Small bilateral mastoid effusions. Other: None. IMPRESSION: Unremarkable appearance of the brain. Electronically Signed   By: Logan Bores M.D.   On: 10/26/2021 16:52   EEG adult  Result Date: 10/26/2021 Derek Jack, MD     10/26/2021 12:50 PM Routine EEG Report Joshia Roser is a 51 y.o. male with a history of cardiac arrest who is undergoing an EEG to evaluate for seizures. Report: This EEG was acquired with electrodes placed according to the International 10-20 electrode system (including Fp1, Fp2, F3, F4, C3, C4, P3, P4, O1, O2, T3, T4, T5, T6, A1, A2, Fz, Cz, Pz). The following electrodes were missing or displaced: none. There was no clear occipital dominant rhythm. The best background was continuous at 11-12 Hz. Some sleep spindles were identified. There was no focal slowing. There were no interictal epileptiform discharges. There were no electrographic seizures identified. Photic stimulation and hyperventilation were not performed. Impression: This EEG was obtained while intubated and sedated on propofol and fentanyl and shows no abnormalities. Su Monks, MD [facility] [phone] If 7pm- 7am, please page neurology on call as listed in [place].    Cardiac Studies    Echocardiogram  1. Left ventricular ejection fraction, by estimation, is 45 to 50%. The  left ventricle has mildly decreased function. The left ventricle  demonstrates global hypokinesis. Left ventricular diastolic parameters  were normal.  2. Right ventricular systolic function is low normal. The right  ventricular size is normal. Tricuspid regurgitation signal is inadequate  for assessing PA pressure.   3. The mitral valve is normal in structure. Trivial mitral valve  regurgitation. No evidence of mitral stenosis.   4. The aortic valve is tricuspid. Aortic valve regurgitation is not  visualized. No aortic stenosis is present.   5. The inferior vena cava is dilated in size with >50% respiratory  variability, suggesting right atrial pressure of 8 mmHg.   Patient Profile     Tiwan Moschella is a 51 y.o. male with no significant medical history who is being seen 10/25/2021 for the evaluation of cardiac arrest   Assessment & Plan    Out of hospital cardiac arrest Etiology unclear Initial rhythm was ventricular fibrillation Echocardiogram shows mildly reduced LV systolic function but no clear wall motion abnormalities.   -Troponin peaking 1400 --aspirin, statin, he has completed 48 hours heparin -Recommend proceeding with left heart catheterization and possible PCI today.  I discussed the procedure in details as well as risk and benefits.  If no ischemic etiology is identified, cardiac MRI and EP consultation will be requested. No pulmonary findings suggestive of sarcoidosis.  Encephalopathy Postarrest, EEG completed, MRI no acute findings He seems to be alert and oriented today.    Total encounter time more than 50 minutes  Greater than 50% was spent in counseling and coordination of care with the patient    For questions or updates, please contact [place] Please consult [email or web address] for contact info under        Signed, Kathlyn Sacramento, MD  10/28/2021, 12:33 PM

## 2021-10-28 NOTE — Progress Notes (Signed)
ANTICOAGULATION CONSULT NOTE  Pharmacy Consult for IV Heparin Indication: chest pain/ACS  Patient Measurements: Height: 6\' 1"  (185.4 cm) Weight: 79.9 kg (176 lb 2.4 oz) IBW/kg (Calculated) : 79.9 Heparin Dosing Weight: 85 kg  Labs: Recent Labs    10/26/21 0521 10/27/21 0509 10/27/21 1336 10/27/21 2104 10/28/21 0453 10/28/21 1148  HGB 13.1 10.5*  --   --  10.6*  --   HCT 42.1 33.7*  --   --  34.1*  --   PLT 287 226  --   --  243  --   HEPARINUNFRC  --  0.20*   < > 0.27* 0.24* 0.33  CREATININE 0.69 0.82  --   --  0.74  --    < > = values in this interval not displayed.    Estimated Creatinine Clearance: 124.8 mL/min (by C-G formula based on SCr of 0.74 mg/dL).   Medical History: History reviewed. No pertinent past medical history.  Medications:  No anticoagulation prior to admission per my chart review  Assessment: Patient is a 51 y/o M with medical history including asthma who was BIBEMS after out of hospital cardiac arrest with ROSC after estimated downtime of 10-12 minutes. Rhythm Vfib upon EMS arrival s/p defibrillation x 2. Of note, patient was seen in the ED 5/25 with chest pain but given normal EKG, normal troponin, and no signs of acute distress, patient was discharged home. Pharmacy consulted to initiate and manage IV heparin for suspected ACS.  Baseline aPTT and PT-INR within normal limits. Baseline CBC overall normal.   Goal of Therapy:  Heparin level 0.3-0.7 units/ml Monitor platelets by anticoagulation protocol: Yes   Date/Time Level  Comment 6/9@1831  HL 0.55 Therapeutic x1 6/10@0056     HL 0.54           Therapeutic x 2  6/11@0509     HL 0.20           SUBtherapeutic  6/11@1336  HL 0.17 6/11@2107     HL 0.27           SUBtherapeutic  6/12@0453     HL 0.24           SUBtherapeutic  6/12@1148  HL 0.33 Therapeutic x 1  Plan:  --Heparin level is therapeutic x 1 --Continue heparin infusion at 1700 units/hr --Confirmatory HL in 6 hours --Daily CBC per  protocol --Pending cardiac catheterization  6/25 10/28/2021 12:27 PM

## 2021-10-29 ENCOUNTER — Encounter: Payer: Self-pay | Admitting: Cardiovascular Disease

## 2021-10-29 ENCOUNTER — Inpatient Hospital Stay (HOSPITAL_COMMUNITY): Payer: 59

## 2021-10-29 DIAGNOSIS — J690 Pneumonitis due to inhalation of food and vomit: Secondary | ICD-10-CM | POA: Diagnosis not present

## 2021-10-29 DIAGNOSIS — I469 Cardiac arrest, cause unspecified: Secondary | ICD-10-CM | POA: Diagnosis not present

## 2021-10-29 DIAGNOSIS — I499 Cardiac arrhythmia, unspecified: Secondary | ICD-10-CM | POA: Diagnosis not present

## 2021-10-29 DIAGNOSIS — I428 Other cardiomyopathies: Secondary | ICD-10-CM

## 2021-10-29 DIAGNOSIS — G9341 Metabolic encephalopathy: Secondary | ICD-10-CM | POA: Diagnosis not present

## 2021-10-29 DIAGNOSIS — J9601 Acute respiratory failure with hypoxia: Secondary | ICD-10-CM | POA: Diagnosis not present

## 2021-10-29 LAB — BASIC METABOLIC PANEL
Anion gap: 4 — ABNORMAL LOW (ref 5–15)
BUN: 15 mg/dL (ref 6–20)
CO2: 26 mmol/L (ref 22–32)
Calcium: 8.3 mg/dL — ABNORMAL LOW (ref 8.9–10.3)
Chloride: 111 mmol/L (ref 98–111)
Creatinine, Ser: 0.7 mg/dL (ref 0.61–1.24)
GFR, Estimated: >60 mL/min (ref >60)
Glucose, Bld: 104 mg/dL — ABNORMAL HIGH (ref 70–99)
Potassium: 3.7 mmol/L (ref 3.5–5.1)
Sodium: 141 mmol/L (ref 135–145)

## 2021-10-29 LAB — PHOSPHORUS: Phosphorus: 2.9 mg/dL (ref 2.5–4.6)

## 2021-10-29 LAB — CBC
HCT: 33.1 % — ABNORMAL LOW (ref 39.0–52.0)
Hemoglobin: 10.4 g/dL — ABNORMAL LOW (ref 13.0–17.0)
MCH: 26.4 pg (ref 26.0–34.0)
MCHC: 31.4 g/dL (ref 30.0–36.0)
MCV: 84 fL (ref 80.0–100.0)
Platelets: 259 10*3/uL (ref 150–400)
RBC: 3.94 MIL/uL — ABNORMAL LOW (ref 4.22–5.81)
RDW: 12.7 % (ref 11.5–15.5)
WBC: 7.6 10*3/uL (ref 4.0–10.5)
nRBC: 0 % (ref 0.0–0.2)

## 2021-10-29 LAB — MAGNESIUM: Magnesium: 1.8 mg/dL (ref 1.7–2.4)

## 2021-10-29 MED ORDER — CARVEDILOL 3.125 MG PO TABS
3.1250 mg | ORAL_TABLET | Freq: Two times a day (BID) | ORAL | Status: DC
Start: 2021-10-29 — End: 2021-10-31
  Administered 2021-10-29 – 2021-10-31 (×4): 3.125 mg via ORAL
  Filled 2021-10-29 (×4): qty 1

## 2021-10-29 MED ORDER — ROSUVASTATIN CALCIUM 10 MG PO TABS
10.0000 mg | ORAL_TABLET | Freq: Every day | ORAL | Status: DC
  Administered 2021-10-29 – 2021-10-31 (×3): 10 mg via ORAL
  Filled 2021-10-29 (×3): qty 1

## 2021-10-29 MED ORDER — NIFEDIPINE 10 MG PO CAPS
10.0000 mg | ORAL_CAPSULE | Freq: Once | ORAL | Status: AC
Start: 2021-10-29 — End: 2021-10-29
  Administered 2021-10-29: 10 mg via ORAL
  Filled 2021-10-29: qty 1

## 2021-10-29 MED ORDER — POTASSIUM CHLORIDE CRYS ER 20 MEQ PO TBCR
40.0000 meq | EXTENDED_RELEASE_TABLET | Freq: Once | ORAL | Status: AC
  Administered 2021-10-29: 40 meq via ORAL
  Filled 2021-10-29: qty 2

## 2021-10-29 MED ORDER — NIFEDIPINE ER OSMOTIC RELEASE 30 MG PO TB24
30.0000 mg | ORAL_TABLET | Freq: Every day | ORAL | Status: DC
  Administered 2021-10-29 – 2021-10-31 (×3): 30 mg via ORAL
  Filled 2021-10-29 (×3): qty 1

## 2021-10-29 MED ORDER — FLECAINIDE ACETATE 100 MG PO TABS
300.0000 mg | ORAL_TABLET | Freq: Once | ORAL | Status: AC
  Administered 2021-10-29: 300 mg via ORAL
  Filled 2021-10-29: qty 3

## 2021-10-29 MED ORDER — ISOSORBIDE MONONITRATE ER 30 MG PO TB24
30.0000 mg | ORAL_TABLET | Freq: Once | ORAL | Status: AC
Start: 2021-10-29 — End: 2021-10-29
  Administered 2021-10-29: 30 mg via ORAL
  Filled 2021-10-29: qty 1

## 2021-10-29 MED ORDER — PANTOPRAZOLE SODIUM 40 MG PO TBEC
40.0000 mg | DELAYED_RELEASE_TABLET | Freq: Every day | ORAL | Status: DC
  Administered 2021-10-29 – 2021-10-31 (×3): 40 mg via ORAL
  Filled 2021-10-29 (×3): qty 1

## 2021-10-29 MED ORDER — GADOBUTROL 1 MMOL/ML IV SOLN
10.0000 mL | Freq: Once | INTRAVENOUS | Status: AC | PRN
  Administered 2021-10-29: 10 mL via INTRAVENOUS

## 2021-10-29 MED ORDER — MAGNESIUM SULFATE 2 GM/50ML IV SOLN
2.0000 g | Freq: Once | INTRAVENOUS | Status: AC
  Administered 2021-10-29: 2 g via INTRAVENOUS
  Filled 2021-10-29: qty 50

## 2021-10-29 MED ORDER — QUETIAPINE FUMARATE 25 MG PO TABS
25.0000 mg | ORAL_TABLET | Freq: Once | ORAL | Status: AC
  Administered 2021-10-29: 25 mg via ORAL
  Filled 2021-10-29: qty 1

## 2021-10-29 NOTE — Assessment & Plan Note (Signed)
Repleted and resolved 

## 2021-10-29 NOTE — Progress Notes (Signed)
Progress Note  Patient Name: Blake Moore Date of Encounter: 10/29/2021  [facility] Cardiologist: Kathlyn Sacramento, MD   Subjective   Patient seen on AM rounds. Denies any chest pain or shortness of breath. No complications over night from [facility]. EP has seen this morning and is planning a flecainide trial and likely ICD implantation tomorrow, 10/30/2021.   Inpatient Medications    Scheduled Meds:  Chlorhexidine Gluconate Cloth  6 each Topical Q0600   enoxaparin (LOVENOX) injection  40 mg Subcutaneous Q24H   feeding supplement  237 mL Oral TID BM   multivitamin with minerals  1 tablet Oral Daily   NIFEdipine  30 mg Oral Daily   pantoprazole  40 mg Oral Daily   polyethylene glycol  17 g Oral Daily   rosuvastatin  10 mg Oral Daily   sodium chloride flush  10-40 mL Intracatheter Q12H   sodium chloride flush  3 mL Intravenous Q12H   sodium chloride flush  3 mL Intravenous Q12H   Continuous Infusions:  sodium chloride     ampicillin-sulbactam (UNASYN) IV 3 g (10/29/21 1147)   PRN Meds: sodium chloride, acetaminophen, guaiFENesin-dextromethorphan, ondansetron (ZOFRAN) IV, sodium chloride flush, sodium chloride flush   Vital Signs    Vitals:   10/29/21 0600 10/29/21 0800 10/29/21 1000 10/29/21 1200  BP: 122/83 117/74 114/72 126/78  Pulse: 78 78 79 87  Resp: (!) 29 (!) 34 (!) 23 (!) 22  Temp:  98.8 F (37.1 C)  98.6 F (37 C)  TempSrc:  Oral  Oral  SpO2: 96% 98% 98% 96%  Weight:      Height:        Intake/Output Summary (Last 24 hours) at 10/29/2021 1357 Last data filed at 10/29/2021 1200 Gross per 24 hour  Intake 1802.59 ml  Output 1500 ml  Net 302.59 ml      10/28/2021    5:00 AM 10/25/2021    5:00 AM 10/25/2021   12:49 AM  Last 3 Weights  Weight (lbs) 176 lb 2.4 oz 187 lb 6.3 oz 187 lb 8 oz  Weight (kg) 79.9 kg 85 kg 85.049 kg      Telemetry    SR 70-90 with some artifact - Personally Reviewed  ECG    SR with LVH, early repolarization, and LAD -  Personally Reviewed  Physical Exam   GEN: No acute distress. Laying in bed watching TV. Neck: No JVD appreciated, no carotid bruits Cardiac: RRR, no murmurs, rubs, or gallops.  Respiratory: Clear to auscultation bilaterally. Respirations are unlabored at rest on room air GI: Soft, nontender, non-distended, bowel sounds present in all 4 quadrants MS: No edema; No deformity. Neuro:  Nonfocal  Psych: Normal affect   Labs    High Sensitivity Troponin:   Recent Labs  Lab 10/10/21 1059 10/25/21 0049 10/25/21 0318 10/25/21 0920 10/25/21 1115  TROPONINIHS <2 16 477* 1,406* 1,437*     Chemistry Recent Labs  Lab 10/25/21 0049 10/25/21 0615 10/25/21 0920 10/26/21 0521 10/27/21 0509 10/28/21 0453 10/29/21 0407  NA 140  --  140   < > 137 142 141  K 2.7*  --  3.9   < > 3.8 3.4* 3.7  CL 107  --  110   < > 106 108 111  CO2 19*  --  25   < > 28 27 26   GLUCOSE 242*  --  104*   < > 129* 109* 104*  BUN 18  --  19   < > 16  14 15  CREATININE 1.09  --  0.84   < > 0.82 0.74 0.70  CALCIUM 8.3*  --  7.9*   < > 7.8* 8.5* 8.3*  MG 2.3   < >  --    < > 2.0 1.7 1.8  PROT 7.1  --  6.1*  --   --   --   --   ALBUMIN 3.7  --  3.2*  --   --   --   --   AST 126*  --  193*  --   --   --   --   ALT 101*  --  99*  --   --   --   --   ALKPHOS 93  --  66  --   --   --   --   BILITOT 0.3  --  0.4  --   --   --   --   GFRNONAA >60  --  >60   < > >60 >60 >60  ANIONGAP 14  --  5   < > 3* 7 4*   < > = values in this interval not displayed.    Lipids  Recent Labs  Lab 10/25/21 1115 10/26/21 0521  CHOL 124  --   TRIG 38 73  HDL 41  --   LDLCALC 75  --   CHOLHDL 3.0  --     Hematology Recent Labs  Lab 10/27/21 0509 10/28/21 0453 10/29/21 0407  WBC 12.2* 12.0* 7.6  RBC 3.96* 4.01* 3.94*  HGB 10.5* 10.6* 10.4*  HCT 33.7* 34.1* 33.1*  MCV 85.1 85.0 84.0  MCH 26.5 26.4 26.4  MCHC 31.2 31.1 31.4  RDW 13.1 12.7 12.7  PLT 226 243 259   Thyroid  Recent Labs  Lab 10/25/21 0049  TSH 0.134*     BNP Recent Labs  Lab 10/25/21 0049  BNP 25.5    DDimer No results for input(s): "DDIMER" in the last 168 hours.   Radiology      Cardiac Studies  LHC completed on 10/28/2021 1.  Normal coronary arteries. 2.  Left ventricular angiography was not diagnostic due to a run of ventricular tachycardia.  EF was 45 to 50% by echo. 3.  Mildly elevated left ventricular end-diastolic pressure.   Recommendations: The patient has no evidence of coronary artery disease to explain his ventricular fibrillation cardiac arrest.  Recommend EP evaluation.  Echocardiogram completed on 10/25/2021 1. Left ventricular ejection fraction, by estimation, is 45 to 50%. The  left ventricle has mildly decreased function. The left ventricle  demonstrates global hypokinesis. Left ventricular diastolic parameters  were normal.   2. Right ventricular systolic function is low normal. The right  ventricular size is normal. Tricuspid regurgitation signal is inadequate  for assessing PA pressure.   3. The mitral valve is normal in structure. Trivial mitral valve  regurgitation. No evidence of mitral stenosis.   4. The aortic valve is tricuspid. Aortic valve regurgitation is not  visualized. No aortic stenosis is present.   5. The inferior vena cava is dilated in size with >50% respiratory  variability, suggesting right atrial pressure of 8 mmHg.   Patient Profile     51 y.o. male with no significant medical history who is being seen for evaluation of cardiac arrest.  Assessment & Plan    Out of hospital cardiac arrest with unclear etiology -initial rhythm was ventricular fibrillation  -echocardiogram reveals mildly reduced LV systolic function but no clear wall  motion abnormalities -Hs troponins peaked at 1400 -continue statin -LHC on 10/28/2021 revealed normal coronaries -cardiac MRI scheduled for today -EP has been consulted and is following, he will have a flecainide trail today and tentatively will  have ICD implanted tomorrow -NPO after midnight -no pulmonary finding indicative of sarcoidosis   2.  Encephalopathy -post arrest EEG completed -MRI revealed no acute findings -current status patient is alert and oriented with short and long term memory intact  For questions or updates, please contact [place] Please consult [email or web address] for contact info under        Signed, Braeden Kennan, NP  10/29/2021, 1:57 PM

## 2021-10-29 NOTE — Progress Notes (Signed)
PHARMACIST - PHYSICIAN COMMUNICATION  CONCERNING: IV to Oral Route Change Policy  RECOMMENDATION: This patient is receiving pantoprazole by the intravenous route.  Based on criteria approved by the Pharmacy and Therapeutics Committee, the intravenous medication(s) is/are being converted to the equivalent oral dose form(s).   DESCRIPTION: These criteria include: The patient is eating (either orally or via tube) and/or has been taking other orally administered medications for a least 24 hours The patient has no evidence of active gastrointestinal bleeding or impaired GI absorption (gastrectomy, short bowel, patient on TNA or NPO).  If you have questions about this conversion, please contact the Pharmacy Department   Tressie Ellis, [facility] 10/29/2021 7:47 AM

## 2021-10-29 NOTE — Assessment & Plan Note (Signed)
Right lower lobe on chest x-ray.  Likely aspiration related.  Continue IV Unasyn 

## 2021-10-29 NOTE — Consult Note (Signed)
PHARMACY CONSULT NOTE  Pharmacy Consult for Electrolyte Monitoring and Replacement   Recent Labs: Potassium (mmol/L)  Date Value  10/29/2021 3.7   Magnesium (mg/dL)  Date Value  29/56/2130 1.8   Calcium (mg/dL)  Date Value  86/57/8469 8.3 (L)   Albumin (g/dL)  Date Value  62/95/2841 3.2 (L)   Phosphorus (mg/dL)  Date Value  32/44/0102 2.9   Sodium (mmol/L)  Date Value  10/29/2021 141   Assessment: Patient is a 51 y/o M with medical history including asthma who was BIBEMS postcardiac arrest. Patient is currently admitted to the ICU where he is intubated, sedated, and on mechanical ventilation. Pharmacy consulted to assist with electrolyte monitoring and replacement as indicated.  Goal of Therapy:  K > 4 Mg > 2  Plan:  --K 3.7, Kcl 40 mEq PO x 1 --Mg 1.8, IV magnesium sulfate 2 g x 1 --Follow-up electrolytes with AM labs tomorrow   Tressie Ellis 10/29/2021 7:44 AM

## 2021-10-29 NOTE — Progress Notes (Signed)
       CROSS COVER NOTE  NAME: Blake Moore MRN: [id number] DOB : 11-04-1970    Date of Service   10/30/2021  HPI/Events of Note   Notified by nursing that tele monitor is showing ST elevation alert. EKG obtained and reads as Acute MI. On my review no significant J point elevation noted. EKG similar to prior. Discussed with night attending who recommended reaching out to Cardiology. Called [facility] Cardiologist on call Dr Caryl Comes who also saw the patient today, he reviewed the EKG and agrees that it is similar to prior. Dr Caryl Comes says there was concern that Blake Moore was having vasospasms and recommended 10 mg SHORT ACTING nifedpine and 30mg  isosorbide mononitrate tonight.  Blake Moore was admitted 10/25/21 after having an out of hospital VFIB arrest. As part of his work up this hospitalization he had a left heart cath and was found to have clean coronaries.  [id number]: Blake Moore is now reporting 3/10 sharp (L) chest pain. No associated dyspnea, abdominal pain, nausea, vomiting, or radiation of pain. The pain is not reproducible on exam.  Interventions   Plan: 10 mg short acting Nifedipine tonight 30 mg Isosorbide mononitrate 2mg  IV Morphine Troponin          Neomia Glass DNP, MHA, FNP-BC Nurse Practitioner [facility] Pager [phone]

## 2021-10-29 NOTE — Progress Notes (Addendum)
Restless and anxious. Keeps calling out and saying , "my bed is moving." Upon assessing room, and attempting to reassure him that there is nothing wrong with the bed and that it is not moving, noted that he is restless and turning from side to side in the bed. He calls out 3 times after this with the same complaint and continuing to redirect without success. Bed alarm is engaged and educated on calling out for assistance w/ call bell if he needs anything. He also forgets why the BP cuff is pumping up so hard and appears to be anxious each time he has a BP check. Notified Neomia Glass, NP. See new order/EMAR.

## 2021-10-29 NOTE — Assessment & Plan Note (Signed)
Out of hospital cardiac arrest with unknown etiology.  Initial rhythm reportedly was V-fib Echo showed mild newly reduced LV systolic function.  Cardiac cath today showed normal coronaries. Troponin peaked at 1400 Continue aspirin, statin.  Completed 48 hours of heparin Patient does not recall events preceding cardiac arrest EP planning flecainide trial and ICD placement tomorrow

## 2021-10-29 NOTE — Progress Notes (Addendum)
       CROSS COVER NOTE  NAME: Blake Moore MRN: [id number] DOB : 07-01-1970    Date of Service   10/28/2021  HPI/Events of Note   Secure chat received from nursing "He is very anxious and restless in the bed. Unable to rest. I feel like he really needs something for anxiety or rest. He has intermittent confusion so tries to get up without calling out also."   Interventions   Plan: Seroquel 25 mg       Bishop Limbo DNP, MHA, FNP-BC Nurse Practitioner [facility] Pager [phone]

## 2021-10-29 NOTE — Assessment & Plan Note (Signed)
MRI and EEG within normal limits.  Patient does not recall events preceding cardiac arrest.  His encephalopathy seem to be resolved. 

## 2021-10-29 NOTE — Progress Notes (Signed)
ECG neg for brugada

## 2021-10-29 NOTE — Progress Notes (Signed)
  Progress Note   Patient: Blake Moore [id number] DOB: October 19, 1970 DOA: 10/25/2021     4 DOS: the patient was seen and examined on 10/29/2021   Brief hospital course: 51 year old male admitted status post cardiac arrest   6/9 admitted to ICU for cardiac arrest 6/10 severe resp failure s/p cardiac arrest 6/11 extubated 6/12: TRH picked up.  Cardiac cath shows no coronary disease. 6/13: Patient was anxious and restless last night with good response to Seroquel, EP planning flecainide trial and likely ICD placement tomorrow   Assessment and Plan: * Cardiac arrest with ventricular fibrillation (HCC) Out of hospital cardiac arrest with unknown etiology.  Initial rhythm reportedly was V-fib Echo showed mild newly reduced LV systolic function.  Cardiac cath today showed normal coronaries. Troponin peaked at 1400 Continue aspirin, statin.  Completed 48 hours of heparin Patient does not recall events preceding cardiac arrest EP planning flecainide trial and ICD placement tomorrow   Hypokalemia Repleted and resolved  Aspiration pneumonia (HCC) Right lower lobe on chest x-ray.  Likely aspiration related.  Continue IV Unasyn.  Acute respiratory failure with hypoxia and hypercapnia (HCC) Required brief.  On ventilation from 6/9 till 6/11 when he was extubated.  He is on room air now  Acute metabolic encephalopathy MRI and EEG within normal limits.  Patient does not recall events preceding cardiac arrest.  His encephalopathy seem to be resolved        Subjective: Denies any new symptoms.  Wants to eat  Physical Exam: Vitals:   10/29/21 1000 10/29/21 1200 10/29/21 1324 10/29/21 1400  BP: 114/72 126/78 117/83 110/63  Pulse: 79 87 92 78  Resp: (!) 23 (!) 22 19 (!) 28  Temp:  98.6 F (37 C)    TempSrc:  Oral    SpO2: 98% 96% 100% 98%  Weight:      Height:       51 year old male lying in the bed comfortably without any acute distress Lungs clear to auscultation  bilaterally Cardiovascular regular rate and rhythm, no murmurs Abdomen soft, benign Neuro alert and oriented, nonfocal Skin no rash or lesion Psychiatry normal mood and affect  Data Reviewed:  There are no new results to review at this time.  Family Communication: None  Disposition: Status is: Inpatient Remains inpatient appropriate because: AICD placement tomorrow   Planned Discharge Destination: Home    DVT prophylaxis-Lovenox Time spent: 35 minutes  Author: Delfino Lovett, MD 10/29/2021 5:52 PM  For on call review [email or web address].

## 2021-10-29 NOTE — Assessment & Plan Note (Signed)
Required brief.  On ventilation from 6/9 till 6/11 when he was extubated.  He is on room air now

## 2021-10-29 NOTE — Progress Notes (Signed)
Pt completed Flecainide challenge. 8 ECGs completed 15 minutes apart per Dr. Graciela Husbands. Pt transported to MRI in no acute distress with cardiac monitor in place.

## 2021-10-30 ENCOUNTER — Encounter: Payer: Self-pay | Admitting: Cardiology

## 2021-10-30 ENCOUNTER — Encounter
Admission: EM | Disposition: A | Discharge status: Home / Self Care | Payer: Self-pay | Source: Home / Self Care | Attending: Internal Medicine

## 2021-10-30 DIAGNOSIS — I469 Cardiac arrest, cause unspecified: Secondary | ICD-10-CM | POA: Diagnosis not present

## 2021-10-30 DIAGNOSIS — D649 Anemia, unspecified: Secondary | ICD-10-CM | POA: Diagnosis not present

## 2021-10-30 DIAGNOSIS — J690 Pneumonitis due to inhalation of food and vomit: Secondary | ICD-10-CM | POA: Diagnosis not present

## 2021-10-30 DIAGNOSIS — I428 Other cardiomyopathies: Secondary | ICD-10-CM

## 2021-10-30 DIAGNOSIS — I4901 Ventricular fibrillation: Secondary | ICD-10-CM | POA: Diagnosis not present

## 2021-10-30 HISTORY — PX: ICD IMPLANT: EP1208

## 2021-10-30 LAB — BASIC METABOLIC PANEL
Anion gap: 8 (ref 5–15)
BUN: 15 mg/dL (ref 6–20)
CO2: 25 mmol/L (ref 22–32)
Calcium: 8.4 mg/dL — ABNORMAL LOW (ref 8.9–10.3)
Chloride: 106 mmol/L (ref 98–111)
Creatinine, Ser: 0.81 mg/dL (ref 0.61–1.24)
GFR, Estimated: >60 mL/min (ref >60)
Glucose, Bld: 108 mg/dL — ABNORMAL HIGH (ref 70–99)
Potassium: 3.7 mmol/L (ref 3.5–5.1)
Sodium: 139 mmol/L (ref 135–145)

## 2021-10-30 LAB — CULTURE, BLOOD (ROUTINE X 2): Culture: NO GROWTH

## 2021-10-30 LAB — TROPONIN I (HIGH SENSITIVITY): Troponin I (High Sensitivity): 16 ng/L (ref <18)

## 2021-10-30 LAB — SURGICAL PCR SCREEN
MRSA, PCR: NEGATIVE
Staphylococcus aureus: NEGATIVE

## 2021-10-30 LAB — MAGNESIUM: Magnesium: 1.7 mg/dL (ref 1.7–2.4)

## 2021-10-30 SURGERY — ICD IMPLANT
Anesthesia: Moderate Sedation

## 2021-10-30 MED ORDER — MORPHINE SULFATE (PF) 2 MG/ML IV SOLN
2.0000 mg | Freq: Once | INTRAVENOUS | Status: AC
  Administered 2021-10-30: 2 mg via INTRAVENOUS
  Filled 2021-10-30: qty 1

## 2021-10-30 MED ORDER — HEPARIN (PORCINE) IN NACL 1000-0.9 UT/500ML-% IV SOLN
INTRAVENOUS | Status: AC
  Filled 2021-10-30: qty 500

## 2021-10-30 MED ORDER — CEFAZOLIN SODIUM-DEXTROSE 2-4 GM/100ML-% IV SOLN
INTRAVENOUS | Status: AC
  Administered 2021-10-30: 2 g via INTRAVENOUS
  Filled 2021-10-30: qty 100

## 2021-10-30 MED ORDER — MIDAZOLAM HCL 2 MG/2ML IJ SOLN
INTRAMUSCULAR | Status: DC | PRN
  Administered 2021-10-30 (×2): 1 mg via INTRAVENOUS

## 2021-10-30 MED ORDER — FENTANYL CITRATE (PF) 100 MCG/2ML IJ SOLN
INTRAMUSCULAR | Status: DC | PRN
  Administered 2021-10-30 (×2): 25 ug via INTRAVENOUS

## 2021-10-30 MED ORDER — ACETAMINOPHEN 325 MG PO TABS
650.0000 mg | ORAL_TABLET | Freq: Four times a day (QID) | ORAL | Status: DC | PRN
  Administered 2021-10-31: 650 mg via ORAL
  Filled 2021-10-30: qty 2

## 2021-10-30 MED ORDER — MIDAZOLAM HCL 2 MG/2ML IJ SOLN
INTRAMUSCULAR | Status: AC
  Filled 2021-10-30: qty 2

## 2021-10-30 MED ORDER — SODIUM CHLORIDE 0.9 % IV SOLN
80.0000 mg | INTRAVENOUS | Status: AC
  Administered 2021-10-30: 80 mg
  Filled 2021-10-30: qty 2

## 2021-10-30 MED ORDER — SODIUM CHLORIDE 0.9 % IV SOLN: INTRAVENOUS | Status: DC

## 2021-10-30 MED ORDER — QUETIAPINE FUMARATE 25 MG PO TABS
25.0000 mg | ORAL_TABLET | Freq: Once | ORAL | Status: AC
Start: 2021-10-30 — End: 2021-10-30
  Administered 2021-10-30: 25 mg via ORAL
  Filled 2021-10-30: qty 1

## 2021-10-30 MED ORDER — MAGNESIUM SULFATE 2 GM/50ML IV SOLN
2.0000 g | Freq: Once | INTRAVENOUS | Status: AC
  Administered 2021-10-30: 2 g via INTRAVENOUS
  Filled 2021-10-30: qty 50

## 2021-10-30 MED ORDER — CHLORHEXIDINE GLUCONATE 4 % EX LIQD: 4.0000 "application " | Freq: Once | CUTANEOUS | Status: DC

## 2021-10-30 MED ORDER — HEPARIN (PORCINE) IN NACL 1000-0.9 UT/500ML-% IV SOLN
INTRAVENOUS | Status: DC | PRN
  Administered 2021-10-30: 500 mL

## 2021-10-30 MED ORDER — POTASSIUM CHLORIDE CRYS ER 20 MEQ PO TBCR
40.0000 meq | EXTENDED_RELEASE_TABLET | Freq: Once | ORAL | Status: AC
  Administered 2021-10-30: 40 meq via ORAL
  Filled 2021-10-30: qty 2

## 2021-10-30 MED ORDER — FENTANYL CITRATE (PF) 100 MCG/2ML IJ SOLN
INTRAMUSCULAR | Status: AC
  Filled 2021-10-30: qty 2

## 2021-10-30 MED ORDER — LIDOCAINE HCL (PF) 1 % IJ SOLN
INTRAMUSCULAR | Status: DC | PRN
  Administered 2021-10-30: 30 mL

## 2021-10-30 MED ORDER — ONDANSETRON HCL 4 MG/2ML IJ SOLN
INTRAMUSCULAR | Status: AC
  Filled 2021-10-30: qty 2

## 2021-10-30 MED ORDER — LIDOCAINE HCL 1 % IJ SOLN
INTRAMUSCULAR | Status: AC
  Filled 2021-10-30: qty 20

## 2021-10-30 MED ORDER — CEFAZOLIN SODIUM-DEXTROSE 2-4 GM/100ML-% IV SOLN: 2.0000 g | INTRAVENOUS | Status: AC

## 2021-10-30 MED ORDER — LIDOCAINE HCL 1 % IJ SOLN
INTRAMUSCULAR | Status: AC
  Filled 2021-10-30: qty 40

## 2021-10-30 SURGICAL SUPPLY — 18 items
CABLE SURG 12 DISP A/V CHANNEL (MISCELLANEOUS) ×1 IMPLANT
DEVICE DSSCT PLSMBLD 3.0S LGHT (MISCELLANEOUS) IMPLANT
ELECT REM PT RETURN 9FT ADLT (ELECTROSURGICAL) ×2
ELECTRODE REM PT RTRN 9FT ADLT (ELECTROSURGICAL) IMPLANT
GENERATOR RESONATE D432 (ICD Generator) ×1 IMPLANT
HANDLE YANKAUER SUCT BULB TIP (MISCELLANEOUS) ×1 IMPLANT
LEAD RELIANCE 0673 ×1 IMPLANT
PAD ELECT DEFIB RADIOL ZOLL (MISCELLANEOUS) ×1 IMPLANT
PLASMABLADE 3.0S W/LIGHT (MISCELLANEOUS) ×2
SHEATH 8FR PRELUDE SNAP 13 (SHEATH) ×1 IMPLANT
SLING ARM IMMOBILIZER LRG (SOFTGOODS) ×1 IMPLANT
SUT ETHIBOND CT1 BRD #0 30IN (SUTURE) ×1 IMPLANT
SUT PROLENE 0 CT 1 30 (SUTURE) ×1 IMPLANT
SUT VIC AB 2-0 CT1 27 (SUTURE) ×1
SUT VIC AB 2-0 CT1 TAPERPNT 27 (SUTURE) IMPLANT
SUT VIC AB 3-0 CT1 27 (SUTURE) ×1
SUT VIC AB 3-0 CT1 TAPERPNT 27 (SUTURE) IMPLANT
TRAY PACEMAKER INSERTION (PACKS) ×2 IMPLANT

## 2021-10-30 NOTE — Progress Notes (Signed)
EP Progress Note  Patient Name: Blake Moore Date of Encounter: 10/30/2021  [facility] Cardiologist: Lorine Bears, MD   Subjective   No acute events overnight.  Resting comfortably this morning watching television.  No complaints.  Inpatient Medications    Scheduled Meds:  carvedilol  3.125 mg Oral BID WC   chlorhexidine  4 application  Topical Once   Chlorhexidine Gluconate Cloth  6 each Topical Q0600   enoxaparin (LOVENOX) injection  40 mg Subcutaneous Q24H   feeding supplement  237 mL Oral TID BM   gentamicin (GARAMYCIN) 80 mg in sodium chloride 0.9 % 500 mL irrigation  80 mg Irrigation On Call   multivitamin with minerals  1 tablet Oral Daily   NIFEdipine  30 mg Oral Daily   pantoprazole  40 mg Oral Daily   polyethylene glycol  17 g Oral Daily   rosuvastatin  10 mg Oral Daily   sodium chloride flush  10-40 mL Intracatheter Q12H   sodium chloride flush  3 mL Intravenous Q12H   sodium chloride flush  3 mL Intravenous Q12H   Continuous Infusions:  sodium chloride     sodium chloride     sodium chloride     ampicillin-sulbactam (UNASYN) IV 3 g (10/30/21 0618)    ceFAZolin (ANCEF) IV     PRN Meds: sodium chloride, acetaminophen, guaiFENesin-dextromethorphan, ondansetron (ZOFRAN) IV, sodium chloride flush, sodium chloride flush   Vital Signs    Vitals:   10/30/21 0215 10/30/21 0400 10/30/21 0500 10/30/21 0600  BP: (!) 111/56 114/65  113/61  Pulse: 96 72 66 91  Resp: (!) 28 (!) 22 (!) 22 (!) 25  Temp:      TempSrc:      SpO2: 99%     Weight:   74.9 kg   Height:        Intake/Output Summary (Last 24 hours) at 10/30/2021 0707 Last data filed at 10/30/2021 0229 Gross per 24 hour  Intake 1548.79 ml  Output 1025 ml  Net 523.79 ml      10/30/2021    5:00 AM 10/28/2021    5:00 AM 10/25/2021    5:00 AM  Last 3 Weights  Weight (lbs) 165 lb 2 oz 176 lb 2.4 oz 187 lb 6.3 oz  Weight (kg) 74.9 kg 79.9 kg 85 kg      Telemetry    Personally Reviewed  ECG     Personally Reviewed  Physical Exam   GEN: No acute distress.   Neck: No JVD Cardiac: RRR, no murmurs, rubs, or gallops.  Respiratory: Clear to auscultation bilaterally. GI: Soft, nontender, non-distended  MS: No edema; No deformity. Neuro:  Nonfocal  Psych: Normal affect   Labs    High Sensitivity Troponin:   Recent Labs  Lab 10/25/21 0049 10/25/21 0318 10/25/21 0920 10/25/21 1115 10/30/21 0304  TROPONINIHS 16 477* 1,406* 1,437* 16     Chemistry Recent Labs  Lab 10/25/21 0049 10/25/21 0615 10/25/21 0920 10/26/21 0521 10/28/21 0453 10/29/21 0407 10/30/21 0304  NA 140  --  140   < > 142 141 139  K 2.7*  --  3.9   < > 3.4* 3.7 3.7  CL 107  --  110   < > 108 111 106  CO2 19*  --  25   < > 27 26 25   GLUCOSE 242*  --  104*   < > 109* 104* 108*  BUN 18  --  19   < > 14 15 15  CREATININE 1.09  --  0.84   < > 0.74 0.70 0.81  CALCIUM 8.3*  --  7.9*   < > 8.5* 8.3* 8.4*  MG 2.3   < >  --    < > 1.7 1.8 1.7  PROT 7.1  --  6.1*  --   --   --   --   ALBUMIN 3.7  --  3.2*  --   --   --   --   AST 126*  --  193*  --   --   --   --   ALT 101*  --  99*  --   --   --   --   ALKPHOS 93  --  66  --   --   --   --   BILITOT 0.3  --  0.4  --   --   --   --   GFRNONAA >60  --  >60   < > >60 >60 >60  ANIONGAP 14  --  5   < > 7 4* 8   < > = values in this interval not displayed.    Lipids  Recent Labs  Lab 10/25/21 1115 10/26/21 0521  CHOL 124  --   TRIG 38 73  HDL 41  --   LDLCALC 75  --   CHOLHDL 3.0  --     Hematology Recent Labs  Lab 10/27/21 0509 10/28/21 0453 10/29/21 0407  WBC 12.2* 12.0* 7.6  RBC 3.96* 4.01* 3.94*  HGB 10.5* 10.6* 10.4*  HCT 33.7* 34.1* 33.1*  MCV 85.1 85.0 84.0  MCH 26.5 26.4 26.4  MCHC 31.2 31.1 31.4  RDW 13.1 12.7 12.7  PLT 226 243 259   Thyroid  Recent Labs  Lab 10/25/21 0049  TSH 0.134*    BNP Recent Labs  Lab 10/25/21 0049  BNP 25.5    DDimer No results for input(s): "DDIMER" in the last 168 hours.   Radiology     MR CARDIAC MORPHOLOGY W WO CONTRAST  Result Date: 10/29/2021 CLINICAL DATA:  Cardiac arrest, evaluate for fibrosis EXAM: CARDIAC MRI TECHNIQUE: The patient was scanned on a 1.5 Tesla Siemens magnet. A dedicated cardiac coil was used. Functional imaging was done using Fiesta sequences. 2,3, and 4 chamber views were done to assess for RWMA's. Modified Simpson's rule using a short axis stack was used to calculate an ejection fraction on a dedicated work [employer], [employer]. The patient received 10 cc of Gadavist. After 10 minutes inversion recovery sequences were used to assess for infiltration and scar tissue. CONTRAST:  10 cc  of Gadavist FINDINGS: 1. Mildly dilated LV size and mildly reduced systolic function (LVEF = 49.5%). There is global hypokinesis with no regional wall motion abnormalities. There is normal LV wall thickness. There is no late gadolinium enhancement in the left ventricular myocardium. Mildly abnormal/elevated ECV value of 35.1% Slightly elevated T1 value of 1066 LVEDV: 219 ml LVESV: 111 ml SV: 108.8 ml Myocardial mass: 131g 2. Normal right ventricular size, thickness. Moderately reduced systolic function (RVEF = 38%). There is global hypokinesis with no regional wall motion abnormalities. 3.  Normal left and right atrial size. 4. Normal size of the aortic root, ascending aorta and pulmonary artery. 5.  Mild tricuspid regurgitation. 6.  Normal pericardium.  Small circumferential pericardial effusion. IMPRESSION: 1. Mildly dilated LV, mildly reduced LV systolic function, LVEF = 49.5%. 2.  No late gadolinium enhancement or scar noted. 3. Abnormal LV extracellular  volume (ECV) and T1 values indicating presence of interstitial edema. 4.  Moderately reduced RV function. Electronically Signed   By: Debbe Odea M.D.   On: 10/29/2021 15:55   CARDIAC CATHETERIZATION  Result Date: 10/28/2021 1.  Normal coronary arteries. 2.  Left ventricular angiography was not diagnostic due to a  run of ventricular tachycardia.  EF was 45 to 50% by echo. 3.  Mildly elevated left ventricular end-diastolic pressure. Recommendations: The patient has no evidence of coronary artery disease to explain his ventricular fibrillation cardiac arrest.  Recommend EP evaluation.      Assessment & Plan    Mr. Scheff is a 51 year old man who I am seeing in follow-up after his cardiac arrest.  He was seen by Dr. Graciela Husbands in consultation earlier this hospitalization.  Work-up is included a heart catheterization showing no significant coronary artery disease, a cardiac MRI showing a minimally reduced ejection fraction without LGE and an EKG without significant changes after flecainide administration.  There is suspicion that his cardiac arrest was caused by coronary artery vasospasm given the chest pain that proceeded the event.  He is remained electrically quiescent since hospitalization.  He presents today for ICD implant.  #Cardiac arrest Plan for ICD implant today.  Single-chamber, single coil [employer], [employer].  I discussed the ICD implant procedure with the patient in detail including the risks and recovery and he wishes to proceed.  The patient has a nonischemic CM and history of resuscitated cardiac arrest.  At this time, he meets criteria for ICD implantation for secondary prevention of sudden death.  I have had a thorough discussion with the patient reviewing options.  The patient and their family (if available) have had opportunities to ask questions and have them answered. The patient and I have decided together through a shared decision making process to proceed with ICD implant at this time.    Risks, benefits, alternatives to ICD implantation were discussed in detail with the patient today. The patient understands that the risks include but are not limited to bleeding, infection, pneumothorax, perforation, tamponade, vascular damage, renal failure, MI, stroke, death, inappropriate shocks, and  lead dislodgement and wishes to proceed.     For questions or updates, please contact [facility] Please consult [email or web address] for contact info under        Signed, Lanier Prude, MD  10/30/2021, 7:07 AM

## 2021-10-30 NOTE — Consult Note (Signed)
[place] for Electrolyte Monitoring and Replacement   Recent Labs: Potassium (mmol/L)  Date Value  10/30/2021 3.7   Magnesium (mg/dL)  Date Value  10/30/2021 1.7   Calcium (mg/dL)  Date Value  10/30/2021 8.4 (L)   Albumin (g/dL)  Date Value  10/25/2021 3.2 (L)   Phosphorus (mg/dL)  Date Value  10/29/2021 2.9   Sodium (mmol/L)  Date Value  10/30/2021 139   Assessment: Patient is a 51 y/o M with medical history including asthma who was BIBEMS postcardiac arrest. Patient is currently admitted to the ICU where he is intubated, sedated, and on mechanical ventilation. Pharmacy consulted to assist with electrolyte monitoring and replacement as indicated.  Goal of Therapy:  K > 4 Mg > 2  Plan:  --K 3.7, Kcl 40 mEq PO x 1 --Mg 1.7, IV magnesium sulfate 2 g x 1 --Follow-up electrolytes with AM labs tomorrow   Benita Gutter 10/30/2021 7:57 AM

## 2021-10-30 NOTE — Progress Notes (Signed)
PROGRESS NOTE    Blake Moore  [id number]:[id number] DOB: June 18, 1970 DOA: 10/25/2021 PCP: Pcp, No  Assessment & Plan:   Principal Problem:   Cardiac arrest with ventricular fibrillation (HCC) Active Problems:   Acute metabolic encephalopathy   Acute respiratory failure with hypoxia and hypercapnia (HCC)   Aspiration pneumonia (HCC)   Hypokalemia   Nonischemic cardiomyopathy (HCC)  Assessment and Plan: Cardiac arrest: with ventricular fibrillation. Etiology unclear. Initial rhythm reportedly was V-fib. Echo showed mild newly reduced LV systolic function.  Cardiac cath showed normal coronaries. Continue aspirin, statin.  Completed 48 hours of heparin. 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 On RA currently    Acute metabolic encephalopathy: resolved. MRI and EEG are WNL  Normocytic anemia: H&H are stable. No need for a transfusion currently     DVT prophylaxis: lovenox  Code Status: full  Family Communication:  Disposition Plan: likely d/c back home   Level of care: Stepdown  Status is: Inpatient Remains inpatient appropriate because: severity of illness, s/p AICD placement today    Consultants:  Cardio  ICU   Procedures:   Antimicrobials:  unasyn    Subjective: Pt c/o fatigue   Objective: Vitals:   10/30/21 0500 10/30/21 0600 10/30/21 0724 10/30/21 0814  BP:  113/61 121/67   Pulse: 66 91 78   Resp: (!) 22 (!) 25 (!) 22   Temp:   98.7 F (37.1 C)   TempSrc:   Oral   SpO2:   97% 98%  Weight: 74.9 kg  74.9 kg   Height:   6\' 1"  (1.854 m)     Intake/Output Summary (Last 24 hours) at 10/30/2021 0817 Last data filed at 10/30/2021 0229 Gross per 24 hour  Intake 1548.79 ml  Output 1025 ml  Net 523.79 ml   Filed Weights   10/28/21 0500 10/30/21 0500 10/30/21 0724  Weight: 79.9 kg 74.9 kg 74.9 kg     Examination:  General exam: Appears calm and comfortable  Respiratory system: Clear to auscultation. Respiratory effort normal. Cardiovascular system: S1 & S2+. No rubs, gallops or clicks.  Gastrointestinal system: Abdomen is nondistended, soft and nontender. Normal bowel sounds heard. Central nervous system: Alert and oriented. Moves all extremities  Psychiatry: Judgement and insight appear normal. Mood & affect appropriate.     Data Reviewed: I have personally reviewed following labs and imaging studies  CBC: Recent Labs  Lab 10/25/21 0049 10/25/21 0615 10/25/21 0920 10/26/21 0521 10/27/21 0509 10/28/21 0453 10/29/21 0407  WBC 9.2   < > 9.1 17.3* 12.2* 12.0* 7.6  NEUTROABS 3.3  --  7.3 15.6*  --   --   --   HGB 13.3   < > 11.9* 13.1 10.5* 10.6* 10.4*  HCT 43.7   < > 37.7* 42.1 33.7* 34.1* 33.1*  MCV 86.5   < > 84.3 84.9 85.1 85.0 84.0  PLT 364   < > 275 287 226 243 259   < > = values in this interval not displayed.   Basic Metabolic Panel: Recent Labs  Lab 10/25/21 0615 10/25/21 0920 10/26/21 0521 10/27/21 0509 10/28/21 0453 10/29/21 0407 10/30/21 0304  NA  --    < > 140 137 142 141 139  K  --    < > 3.6 3.8 3.4* 3.7 3.7  CL  --    < >  110 106 108 111 106  CO2  --    < > GLUCOSE  --    < > 104* 129* 109* 104* 108*  BUN  --    < > CREATININE  --    < > 0.69 0.82 0.74 0.70 0.81  CALCIUM  --    < > 8.4* 7.8* 8.5* 8.3* 8.4*  MG 2.1  --  2.0 2.0 1.7 1.8 1.7  PHOS 3.3  --  3.1 1.9* 2.5 2.9  --    < > = values in this interval not displayed.   GFR: Estimated Creatinine Clearance: 115.6 mL/min (by C-G formula based on SCr of 0.81 mg/dL). Liver Function Tests: Recent Labs  Lab 10/25/21 0049 10/25/21 0920  AST 126* 193*  ALT 101* 99*  ALKPHOS 93 66  BILITOT 0.3 0.4  PROT 7.1 6.1*  ALBUMIN 3.7 3.2*   No results for input(s): "LIPASE", "AMYLASE" in the last 168 hours. No results for input(s): "AMMONIA" in the last 168  hours. Coagulation Profile: Recent Labs  Lab 10/25/21 0049  INR 1.0   Cardiac Enzymes: No results for input(s): "CKTOTAL", "CKMB", "CKMBINDEX", "TROPONINI" in the last 168 hours. BNP (last 3 results) No results for input(s): "PROBNP" in the last 8760 hours. HbA1C: No results for input(s): "HGBA1C" in the last 72 hours. CBG: Recent Labs  Lab 10/26/21 1605 10/26/21 2028 10/26/21 2339 10/27/21 0431 10/27/21 0741  GLUCAP 95 153* 155* 116* 158*   Lipid Profile: No results for input(s): "CHOL", "HDL", "LDLCALC", "TRIG", "CHOLHDL", "LDLDIRECT" in the last 72 hours. Thyroid Function Tests: No results for input(s): "TSH", "T4TOTAL", "FREET4", "T3FREE", "THYROIDAB" in the last 72 hours. Anemia Panel: No results for input(s): "VITAMINB12", "FOLATE", "FERRITIN", "TIBC", "IRON", "RETICCTPCT" in the last 72 hours. Sepsis Labs: Recent Labs  Lab 10/25/21 0049 10/25/21 0318 10/25/21 0615 10/25/21 0920 10/25/21 1224 10/26/21 0521 10/27/21 0509  PROCALCITON <0.10  --   --   --   --  4.17 3.57  LATICACIDVEN  --  3.7* 2.9* 1.7 1.3  --   --     Recent Results (from the past 240 hour(s))  Urine Culture     Status: None   Collection Time: 10/25/21 12:49 AM   Specimen: Urine, Catheterized  Result Value Ref Range Status   Specimen Description   Final    URINE, CATHETERIZED Performed at [facility], [address] [place], [place] [address]    Special Requests   Final    NONE Performed at [facility], [address] [place], [place] [address]    Culture   Final    NO GROWTH Performed at [facility], [address], [place], [place] [address]    Report Status 10/26/2021 FINAL  Final  Blood culture (routine x 2)     Status: None   Collection Time: 10/25/21  3:18 AM   Specimen: BLOOD  Result Value Ref Range Status   Specimen Description BLOOD LEFT FA  Final   Special Requests   Final    BOTTLES DRAWN AEROBIC AND ANAEROBIC Blood Culture results may  not be optimal due to an inadequate volume of blood received in culture bottles   Culture   Final    NO GROWTH 5 DAYS Performed at [facility], [address] [place], [place] [address]    Report Status 10/30/2021 FINAL  Final  Blood culture (routine x 2)  Status: Abnormal   Collection Time: 10/25/21  3:18 AM   Specimen: BLOOD  Result Value Ref Range Status   Specimen Description   Final    BLOOD LEFT AC Performed at [facility], [address] [place], [place] [address]    Special Requests   Final    BOTTLES DRAWN AEROBIC AND ANAEROBIC Blood Culture results may not be optimal due to an inadequate volume of blood received in culture bottles Performed at [facility], [address] [place], [place] [address]    Culture  Setup Time   Final    ANAEROBIC BOTTLE ONLY GRAM POSITIVE COCCI Organism ID to follow CRITICAL RESULT CALLED TO, READ BACK BY AND VERIFIED WITH: JASON ROBBINS @ 10/26/21 0013 LFD    Culture (A)  Final    STAPHYLOCOCCUS HOMINIS THE SIGNIFICANCE OF ISOLATING THIS ORGANISM FROM A SINGLE SET OF BLOOD CULTURES WHEN MULTIPLE SETS ARE DRAWN IS UNCERTAIN. PLEASE NOTIFY THE MICROBIOLOGY DEPARTMENT WITHIN ONE WEEK IF SPECIATION AND SENSITIVITIES ARE REQUIRED. Performed at [facility], [address] [place], [place] [address]    Report Status 10/28/2021 FINAL  Final  Blood Culture ID Panel (Reflexed)     Status: Abnormal   Collection Time: 10/25/21  3:18 AM  Result Value Ref Range Status   Enterococcus faecalis NOT DETECTED NOT DETECTED Final   Enterococcus Faecium NOT DETECTED NOT DETECTED Final   Listeria monocytogenes NOT DETECTED NOT DETECTED Final   Staphylococcus species DETECTED (A) NOT DETECTED Final    Comment: CRITICAL RESULT CALLED TO, READ BACK BY AND VERIFIED WITH: JASON ROBBINS @ 0013 10/26/21 LFD    Staphylococcus aureus (BCID) NOT DETECTED NOT DETECTED Final   Staphylococcus epidermidis NOT DETECTED NOT  DETECTED Final   Staphylococcus lugdunensis NOT DETECTED NOT DETECTED Final   Streptococcus species NOT DETECTED NOT DETECTED Final   Streptococcus agalactiae NOT DETECTED NOT DETECTED Final   Streptococcus pneumoniae NOT DETECTED NOT DETECTED Final   Streptococcus pyogenes NOT DETECTED NOT DETECTED Final   A.calcoaceticus-baumannii NOT DETECTED NOT DETECTED Final   Bacteroides fragilis NOT DETECTED NOT DETECTED Final   Enterobacterales NOT DETECTED NOT DETECTED Final   Enterobacter cloacae complex NOT DETECTED NOT DETECTED Final   Escherichia coli NOT DETECTED NOT DETECTED Final   Klebsiella aerogenes NOT DETECTED NOT DETECTED Final   Klebsiella oxytoca NOT DETECTED NOT DETECTED Final   Klebsiella pneumoniae NOT DETECTED NOT DETECTED Final   Proteus species NOT DETECTED NOT DETECTED Final   Salmonella species NOT DETECTED NOT DETECTED Final   Serratia marcescens NOT DETECTED NOT DETECTED Final   Haemophilus influenzae NOT DETECTED NOT DETECTED Final   Neisseria meningitidis NOT DETECTED NOT DETECTED Final   Pseudomonas aeruginosa NOT DETECTED NOT DETECTED Final   Stenotrophomonas maltophilia NOT DETECTED NOT DETECTED Final   Candida albicans NOT DETECTED NOT DETECTED Final   Candida auris NOT DETECTED NOT DETECTED Final   Candida glabrata NOT DETECTED NOT DETECTED Final   Candida krusei NOT DETECTED NOT DETECTED Final   Candida parapsilosis NOT DETECTED NOT DETECTED Final   Candida tropicalis NOT DETECTED NOT DETECTED Final   Cryptococcus neoformans/gattii NOT DETECTED NOT DETECTED Final    Comment: Performed at [facility], [address] [place], [place] [address]  MRSA Next Gen by PCR, Nasal     Status: None   Collection Time: 10/25/21  5:33 AM   Specimen: Nasal Mucosa; Nasal Swab  Result Value Ref Range Status   MRSA by PCR Next Gen  NOT DETECTED NOT DETECTED Final    Comment: (NOTE) The GeneXpert MRSA Assay (FDA approved for NASAL specimens only), is one component  of a comprehensive MRSA colonization surveillance program. It is not intended to diagnose MRSA infection nor to guide or monitor treatment for MRSA infections. Test performance is not FDA approved in patients less than 82 years old. Performed at [facility], [address] [place], [place] [address]   Culture, Respiratory w Gram Stain     Status: None   Collection Time: 10/26/21  5:21 AM   Specimen: Tracheal Aspirate; Respiratory  Result Value Ref Range Status   Specimen Description   Final    TRACHEAL ASPIRATE Performed at [facility], [address] [place], [place] [address]    Special Requests   Final    NONE Performed at [facility], [address] [place], [place] [address]    Gram Stain   Final    FEW WBC PRESENT, PREDOMINANTLY PMN FEW GRAM POSITIVE COCCI IN PAIRS AND CHAINS Performed at [facility], [address], [place], [place] [address]    Culture ABUNDANT STREPTOCOCCUS PNEUMONIAE  Final   Report Status 10/28/2021 FINAL  Final   Organism ID, Bacteria STREPTOCOCCUS PNEUMONIAE  Final      Susceptibility   Streptococcus pneumoniae - MIC*    ERYTHROMYCIN >=8 RESISTANT Resistant     LEVOFLOXACIN 1 SENSITIVE Sensitive     VANCOMYCIN 0.5 SENSITIVE Sensitive     PENICILLIN (meningitis) <=0.06 SENSITIVE Sensitive     PENO - penicillin <=0.06      PENICILLIN (non-meningitis) <=0.06 SENSITIVE Sensitive     PENICILLIN (oral) <=0.06 SENSITIVE Sensitive     CEFTRIAXONE (non-meningitis) <=0.12 SENSITIVE Sensitive     CEFTRIAXONE (meningitis) <=0.12 SENSITIVE Sensitive     * ABUNDANT STREPTOCOCCUS PNEUMONIAE  Surgical PCR screen     Status: None   Collection Time: 10/30/21  6:36 AM   Specimen: Nasal Mucosa; Nasal Swab  Result Value Ref Range Status   MRSA, PCR NEGATIVE NEGATIVE Final   Staphylococcus aureus NEGATIVE NEGATIVE Final    Comment: (NOTE) The Xpert SA Assay (FDA approved for NASAL specimens in patients 86 years of age  and older), is one component of a comprehensive surveillance program. It is not intended to diagnose infection nor to guide or monitor treatment. Performed at [facility], [address] [place], [place] [address]          Radiology Studies: MR CARDIAC MORPHOLOGY W WO CONTRAST  Result Date: 10/29/2021 CLINICAL DATA:  Cardiac arrest, evaluate for fibrosis EXAM: CARDIAC MRI TECHNIQUE: The patient was scanned on a 1.5 Tesla Siemens magnet. A dedicated cardiac coil was used. Functional imaging was done using [employer] sequences. 2,3, and 4 chamber views were done to assess for RWMA's. Modified Simpson's rule using a short axis stack was used to calculate an ejection fraction on a dedicated work [employer]. The patient received 10 cc of Gadavist. After 10 minutes inversion recovery sequences were used to assess for infiltration and scar tissue. CONTRAST:  10 cc  of Gadavist FINDINGS: 1. Mildly dilated LV size and mildly reduced systolic function (LVEF = 49.5%). There is global hypokinesis with no regional wall motion abnormalities. There is normal LV wall thickness. There is no late gadolinium enhancement in the left ventricular myocardium. Mildly abnormal/elevated ECV value of 35.1% Slightly elevated T1 value of 1066 LVEDV: 219 ml LVESV: 111 ml SV: 108.8 ml Myocardial mass: 131g 2.  Normal right ventricular size, thickness. Moderately reduced systolic function (RVEF = 38%). There is global hypokinesis with no regional wall motion abnormalities. 3.  Normal left and right atrial size. 4. Normal size of the aortic root, ascending aorta and pulmonary artery. 5.  Mild tricuspid regurgitation. 6.  Normal pericardium.  Small circumferential pericardial effusion. IMPRESSION: 1. Mildly dilated LV, mildly reduced LV systolic function, LVEF = 49.5%. 2.  No late gadolinium enhancement or scar noted. 3. Abnormal LV extracellular volume (ECV) and T1 values indicating presence of interstitial  edema. 4.  Moderately reduced RV function. Electronically Signed   By: Debbe Odea M.D.   On: 10/29/2021 15:55   CARDIAC CATHETERIZATION  Result Date: 10/28/2021 1.  Normal coronary arteries. 2.  Left ventricular angiography was not diagnostic due to a run of ventricular tachycardia.  EF was 45 to 50% by echo. 3.  Mildly elevated left ventricular end-diastolic pressure. Recommendations: The patient has no evidence of coronary artery disease to explain his ventricular fibrillation cardiac arrest.  Recommend EP evaluation.        Scheduled Meds:  [MAR Hold] carvedilol  3.125 mg Oral BID WC   [MAR Hold] Chlorhexidine Gluconate Cloth  6 each Topical Q0600   [MAR Hold] enoxaparin (LOVENOX) injection  40 mg Subcutaneous Q24H   [MAR Hold] feeding supplement  237 mL Oral TID BM   gentamicin (GARAMYCIN) 80 mg in sodium chloride 0.9 % 500 mL irrigation  80 mg Irrigation On Call   [MAR Hold] multivitamin with minerals  1 tablet Oral Daily   [MAR Hold] NIFEdipine  30 mg Oral Daily   [MAR Hold] pantoprazole  40 mg Oral Daily   [MAR Hold] polyethylene glycol  17 g Oral Daily   potassium chloride  40 mEq Oral Once   [MAR Hold] rosuvastatin  10 mg Oral Daily   [MAR Hold] sodium chloride flush  10-40 mL Intracatheter Q12H   [MAR Hold] sodium chloride flush  3 mL Intravenous Q12H   [MAR Hold] sodium chloride flush  3 mL Intravenous Q12H   Continuous Infusions:  [MAR Hold] sodium chloride     sodium chloride     [MAR Hold] ampicillin-sulbactam (UNASYN) IV 3 g (10/30/21 0618)   magnesium sulfate bolus IVPB       LOS: 5 days    Time spent: 32 mins     Charise Killian, MD [facility] Pager [phone]  If 7PM-7AM, please contact night-coverage 10/30/2021, 8:17 AM

## 2021-10-30 NOTE — TOC Initial Note (Signed)
Transition of Care [facility]) - Initial/Assessment Note    Patient Details  Name: Blake Moore MRN: [id number] Date of Birth: 16-Apr-1971  Transition of Care [facility]) CM/SW Contact:    Alberteen Sam, LCSW Phone Number: 10/30/2021, 3:46 PM  Clinical Narrative:                  CSW met with patient at bedside to discuss dc planning, patient needs PCP and does have insurance with [employer]. CSW to gather list of PCP through patient's insurance to provide patient prior to dc for him to call a PCP off list to schedule apt.   Patient reports he uses [facility] [address], reports he has home support and no discharge needs.     Expected Discharge Plan: Home/Self Care Barriers to Discharge: Continued Medical Work up   Patient Goals and CMS Choice Patient states their goals for this hospitalization and ongoing recovery are:: to go home CMS Medicare.gov Compare Post Acute Care list provided to:: Patient Choice offered to / list presented to : Patient  Expected Discharge Plan and Services Expected Discharge Plan: Home/Self Care   Discharge Planning Services: CM Consult   Living arrangements for the past 2 months: Apartment                                      Prior Living Arrangements/Services Living arrangements for the past 2 months: Apartment Lives with:: Significant Other, Minor Children Patient language and need for interpreter reviewed:: Yes Do you feel safe going back to the place where you live?: Yes      Need for Family Participation in Patient Care: Yes (Comment) Care giver support system in place?: Yes (comment)   Criminal Activity/Legal Involvement Pertinent to Current Situation/Hospitalization: No - Comment as needed  Activities of Daily Living Home Assistive Devices/Equipment: None ADL Screening (condition at time of admission) Patient's cognitive ability adequate to safely complete daily activities?: Yes Is the patient deaf or have difficulty  hearing?: No Does the patient have difficulty seeing, even when wearing glasses/contacts?: No Does the patient have difficulty concentrating, remembering, or making decisions?: No Patient able to express need for assistance with ADLs?: Yes Does the patient have difficulty dressing or bathing?: No Independently performs ADLs?: Yes (appropriate for developmental age) Does the patient have difficulty walking or climbing stairs?: No Weakness of Legs: None Weakness of Arms/Hands: None  Permission Sought/Granted Permission sought to share information with : Case Manager, Family Supports Permission granted to share information with : Yes, Verbal Permission Granted  Share Information with NAME: Jeris Penta     Permission granted to share info w Relationship: significant other  Permission granted to share info w Contact Information: [phone]  Emotional Assessment Appearance:: Appears stated age Attitude/Demeanor/Rapport: Gracious Affect (typically observed): Calm Orientation: : Oriented to Self, Oriented to Place, Oriented to  Time, Oriented to Situation Alcohol / Substance Use: Not Applicable Psych Involvement: No (comment)  Admission diagnosis:  Cardiac arrest ([place]) [I46.9] Hypokalemia [E87.6] Cardiac arrest with ventricular fibrillation ([place]) [I46.9, I49.01] Patient Active Problem List   Diagnosis Date Noted   Nonischemic cardiomyopathy ([place])    Acute metabolic encephalopathy 03/49/1791   Acute respiratory failure with hypoxia and hypercapnia ([place]) 10/28/2021   Aspiration pneumonia ([place]) 10/28/2021   Hypokalemia 10/28/2021   Cardiac arrest with ventricular fibrillation ([place]) 10/25/2021   PCP:  Pcp, No Pharmacy:   [facility] [id number] -  [place], [place] - [address] [place] [address] Phone: [phone] Fax: [phone]     Social Determinants of Health (SDOH) Interventions    Readmission Risk Interventions     No data to display

## 2021-10-31 ENCOUNTER — Inpatient Hospital Stay: Payer: 59

## 2021-10-31 DIAGNOSIS — D649 Anemia, unspecified: Secondary | ICD-10-CM | POA: Diagnosis not present

## 2021-10-31 DIAGNOSIS — I469 Cardiac arrest, cause unspecified: Secondary | ICD-10-CM | POA: Diagnosis not present

## 2021-10-31 DIAGNOSIS — I4901 Ventricular fibrillation: Secondary | ICD-10-CM | POA: Diagnosis not present

## 2021-10-31 DIAGNOSIS — J690 Pneumonitis due to inhalation of food and vomit: Secondary | ICD-10-CM | POA: Diagnosis not present

## 2021-10-31 LAB — CBC
HCT: 40.4 % (ref 39.0–52.0)
Hemoglobin: 12.8 g/dL — ABNORMAL LOW (ref 13.0–17.0)
MCH: 25.7 pg — ABNORMAL LOW (ref 26.0–34.0)
MCHC: 31.7 g/dL (ref 30.0–36.0)
MCV: 81.1 fL (ref 80.0–100.0)
Platelets: 326 K/uL (ref 150–400)
RBC: 4.98 MIL/uL (ref 4.22–5.81)
RDW: 12.6 % (ref 11.5–15.5)
WBC: 10.5 K/uL (ref 4.0–10.5)
nRBC: 0 % (ref 0.0–0.2)

## 2021-10-31 LAB — BASIC METABOLIC PANEL
Anion gap: 6 (ref 5–15)
BUN: 13 mg/dL (ref 6–20)
CO2: 25 mmol/L (ref 22–32)
Calcium: 9 mg/dL (ref 8.9–10.3)
Chloride: 107 mmol/L (ref 98–111)
Creatinine, Ser: 0.7 mg/dL (ref 0.61–1.24)
GFR, Estimated: >60 mL/min (ref >60)
Glucose, Bld: 116 mg/dL — ABNORMAL HIGH (ref 70–99)
Potassium: 4 mmol/L (ref 3.5–5.1)
Sodium: 138 mmol/L (ref 135–145)

## 2021-10-31 LAB — MAGNESIUM: Magnesium: 1.9 mg/dL (ref 1.7–2.4)

## 2021-10-31 MED ORDER — LOSARTAN POTASSIUM 25 MG PO TABS: 12.5000 mg | ORAL_TABLET | Freq: Every day | ORAL | 0 refills | Status: DC

## 2021-10-31 MED ORDER — CARVEDILOL 6.25 MG PO TABS: 6.2500 mg | ORAL_TABLET | Freq: Two times a day (BID) | ORAL | 0 refills | Status: DC

## 2021-10-31 MED ORDER — CARVEDILOL 3.125 MG PO TABS
3.1250 mg | ORAL_TABLET | Freq: Once | ORAL | Status: AC
Start: 2021-10-31 — End: 2021-10-31
  Administered 2021-10-31: 3.125 mg via ORAL
  Filled 2021-10-31: qty 1

## 2021-10-31 MED ORDER — LOSARTAN POTASSIUM 25 MG PO TABS
12.5000 mg | ORAL_TABLET | Freq: Every day | ORAL | Status: DC
  Filled 2021-10-31: qty 0.5

## 2021-10-31 MED ORDER — CARVEDILOL 6.25 MG PO TABS
6.2500 mg | ORAL_TABLET | Freq: Two times a day (BID) | ORAL | Status: DC
Start: 2021-10-31 — End: 2021-10-31

## 2021-10-31 MED ORDER — ROSUVASTATIN CALCIUM 10 MG PO TABS: 10.0000 mg | ORAL_TABLET | Freq: Every day | ORAL | 0 refills | Status: DC

## 2021-10-31 NOTE — Consult Note (Signed)
PHARMACY CONSULT NOTE  Pharmacy Consult for Electrolyte Monitoring and Replacement   Recent Labs: Potassium (mmol/L)  Date Value  10/31/2021 4.0   Magnesium (mg/dL)  Date Value  32/06/3341 1.9   Calcium (mg/dL)  Date Value  56/86/1683 9.0   Albumin (g/dL)  Date Value  72/90/2111 3.2 (L)   Phosphorus (mg/dL)  Date Value  55/20/8022 2.9   Sodium (mmol/L)  Date Value  10/31/2021 138   Assessment: Patient is a 51 y/o M with medical history including asthma who was BIBEMS postcardiac arrest. Patient is currently admitted to the ICU where he is intubated, sedated, and on mechanical ventilation. Pharmacy consulted to assist with electrolyte monitoring and replacement as indicated.  Goal of Therapy:  K > 4 Mg > 2  Plan:  --No electrolyte replacement indicated at this time --Patient care transferred from PCCM to [facility]. Will discontinue electrolyte consult at this time. Defer further ordering of labs and electrolyte replacement to primary team --Pharmacy will continue to follow along peripherally   Tressie Ellis 10/31/2021 7:36 AM

## 2021-10-31 NOTE — Progress Notes (Signed)
Pt discharged home with family, denies any needs. Denies any pain at this time. Discharge instruction given and explained and  copy given to pt and he stated understanding.

## 2021-10-31 NOTE — Discharge Summary (Signed)
Physician Discharge Summary  Blake Moore ZOX:[id number] DOB: July 04, 1970 DOA: 10/25/2021  PCP: Pcp, No  Admit date: 10/25/2021 Discharge date: 10/31/2021  Admitted From: home  Disposition: home   Recommendations for Outpatient Follow-up:  Follow up with PCP in 1-2 weeks F/u w/ cardio, Blake Moore, in 1-2 weeks   Home Health: no  Equipment/Devices:  Discharge Condition: stable  CODE STATUS: full  Diet recommendation: Heart Healthy   Brief/Interim Summary: HPI was taken from Blake Moore: Blake Moore is a 51 y.o. male with no significant past medical history who presents to the emergency department with EMS after a postcardiac arrest.  History is provided by patient and significant other Blake Moore and EMS.  Patient's significant other states that patient was complaining of chest pain last week and was seen in the emergency department.  He had been in his normal state of health today.  She states that he was sleeping and she came to bed after putting her son down and he "jumped out of bed" and was "incoherent".  She states he went to the kitchen and began stumbling and fell forward and then hit the ground.  She states he was not breathing and started CPR immediately.  EMS reports that the Police Department arrived within 1 to 2 minutes of the EMS call and applied an AED and administered 2 shocks.  On EMS arrival patient was in a V-fib arrest and they administered another 2 rounds of defibrillation.  He had a proximal downtime of 10 to 12 minutes and they obtained ROSC.  He was not given any medications.    They did briefly have a King airway in place but patient began breathing on his own and they removed the [facility] airway.  He has not been answering questions or following commands.  Blood sugar 229.  No known cardiac history.  Significant other denies history of hypertension, diabetes, hyperlipidemia or tobacco use.  She states she thinks there is a family history of cardiac disease but is not sure.  EMS  was concerned about a STEMI on their EKG and activated a code STEMI in route."   In ER, intubated for GCS 3.  Repeat EKG more benign, code STEMI cancelled.  Patient pan-scanned without proximal cause for arrest.  Labs with marijuna urine, low K otherwise benign.  Recent ER visit last week for left sided chest pain but trops neg.     PCCM asked to admit for post arrest care.  As per Dr. Delfino Moore: 51 year old male admitted status post cardiac arrest    6/9 admitted to ICU for cardiac arrest 6/10 severe resp failure s/p cardiac arrest 6/11 extubated 6/12: TRH picked up.  Cardiac cath shows no coronary disease. 6/13: Patient was anxious and restless last night with good response to Seroquel, EP planning flecainide trial and likely ICD placement tomorrow   As per Dr. Mayford Moore 6/14-6/15/23: Pt is s/p ICD placement on 10/30/21. Pt tolerated the procedure well. Pt was d/c home w/ losartan, coreg, & statin as per cardio. For more information, please see previous progress/consult notes    Discharge Diagnoses:  Principal Problem:   Cardiac arrest with ventricular fibrillation (HCC) Active Problems:   Acute metabolic encephalopathy   Acute respiratory failure with hypoxia and hypercapnia (HCC)   Aspiration pneumonia (HCC)   Hypokalemia   Nonischemic cardiomyopathy (HCC)  Cardiac arrest: with ventricular fibrillation. Etiology unclear. Initial rhythm reportedly was V-fib. Echo showed mild newly reduced LV systolic function.  Cardiac cath showed normal coronaries.Continue on statin  Completed 48 hours of heparin. S/p ICD placed 10/30/21   Hypokalemia: WNL today    Aspiration pneumonia: completed abx course   Acute respiratory hypoxic & hypercapnic respiratory failure: intubated, ventilated & extubated. On RA currently    Acute metabolic encephalopathy: resolved. MRI and EEG are WNL  Normocytic anemia: H&H are stable. No need for a transfusion currently   Discharge Instructions  Discharge  Instructions     Diet - low sodium heart healthy   Complete by: As directed    Discharge instructions   Complete by: As directed    F/u w/ PCP in 1-2 weeks. F/u w/ cardio, Blake Moore, in 1-2 weeks   Increase activity slowly   Complete by: As directed       Allergies as of 10/31/2021   No Known Allergies      Medication List     TAKE these medications    albuterol 108 (90 Base) MCG/ACT inhaler Commonly known as: VENTOLIN HFA Inhale 2 puffs into the lungs every 6 (six) hours as needed for wheezing or shortness of breath.   carvedilol 6.25 MG tablet Commonly known as: COREG Take 1 tablet (6.25 mg total) by mouth 2 (two) times daily with a meal.   losartan 25 MG tablet Commonly known as: COZAAR Take 0.5 tablets (12.5 mg total) by mouth daily.   rosuvastatin 10 MG tablet Commonly known as: CRESTOR Take 1 tablet (10 mg total) by mouth daily. Start taking on: November 01, 2021        Follow-up Information     [facility] [address] [facility] Follow up on 11/14/2021.   Specialty: Cardiology Why: 10:40 AM - Device/Wound check Contact information: [address] [place] [address] [phone]        Lanier Prude, MD Follow up on 02/05/2022.   Specialties: Cardiology, Radiology Why: 11:40 AM Contact information: [address] [place] [address] [phone]                No Known Allergies  Consultations: Cardio  EP ICU   Procedures/Studies: DG Chest 2 View  Result Date: 10/31/2021 CLINICAL DATA:  ICD EXAM: CHEST - 2 VIEW COMPARISON:  10/26/2021 FINDINGS: New left chest wall single lead ICD. Lead overlies the right ventricle. No pneumothorax. Persistent but improved patchy opacities at the lung bases. Normal heart size. IMPRESSION: Single lead ICD with tip overlying the right ventricle. No pneumothorax. Persistent but improved basilar opacities. Electronically Signed   By: Guadlupe Spanish M.D.   On:  10/31/2021 08:26   EP PPM/ICD IMPLANT  Result Date: 10/30/2021  CONCLUSIONS:  1. Nonischemic cardiomyopathy and a history of resuscitated VF arrest  2. Successful ICD implantation with a [employer] VVI ICD implanted for secondary prevention of sudden death.  3.  No early apparent complications.   MR CARDIAC MORPHOLOGY W WO CONTRAST  Result Date: 10/29/2021 CLINICAL DATA:  Cardiac arrest, evaluate for fibrosis EXAM: CARDIAC MRI TECHNIQUE: The patient was scanned on a 1.5 Tesla Siemens magnet. A dedicated cardiac coil was used. Functional imaging was done using Fiesta sequences. 2,3, and 4 chamber views were done to assess for RWMA's. Modified Simpson's rule using a short axis stack was used to calculate an ejection fraction on a dedicated work [employer]. The patient received 10 cc of Gadavist. After 10 minutes inversion recovery sequences were used to assess for infiltration and scar tissue. CONTRAST:  10 cc  of Gadavist FINDINGS: 1.  Mildly dilated LV size and mildly reduced systolic function (LVEF = 49.5%). There is global hypokinesis with no regional wall motion abnormalities. There is normal LV wall thickness. There is no late gadolinium enhancement in the left ventricular myocardium. Mildly abnormal/elevated ECV value of 35.1% Slightly elevated T1 value of 1066 LVEDV: 219 ml LVESV: 111 ml SV: 108.8 ml Myocardial mass: 131g 2. Normal right ventricular size, thickness. Moderately reduced systolic function (RVEF = 38%). There is global hypokinesis with no regional wall motion abnormalities. 3.  Normal left and right atrial size. 4. Normal size of the aortic root, ascending aorta and pulmonary artery. 5.  Mild tricuspid regurgitation. 6.  Normal pericardium.  Small circumferential pericardial effusion. IMPRESSION: 1. Mildly dilated LV, mildly reduced LV systolic function, LVEF = 49.5%. 2.  No late gadolinium enhancement or scar noted. 3. Abnormal LV extracellular volume (ECV) and T1  values indicating presence of interstitial edema. 4.  Moderately reduced RV function. Electronically Signed   By: Debbe Odea M.D.   On: 10/29/2021 15:55   CARDIAC CATHETERIZATION  Result Date: 10/28/2021 1.  Normal coronary arteries. 2.  Left ventricular angiography was not diagnostic due to a run of ventricular tachycardia.  EF was 45 to 50% by echo. 3.  Mildly elevated left ventricular end-diastolic pressure. Recommendations: The patient has no evidence of coronary artery disease to explain his ventricular fibrillation cardiac arrest.  Recommend EP evaluation.   MR BRAIN WO CONTRAST  Result Date: 10/26/2021 CLINICAL DATA:  Anoxic brain damage.  Cardiac arrest. EXAM: MRI HEAD WITHOUT CONTRAST TECHNIQUE: Multiplanar, multiecho pulse sequences of the brain and surrounding structures were obtained without intravenous contrast. COMPARISON:  Head CT 10/25/2021 FINDINGS: Brain: There is no evidence of an acute infarct, intracranial hemorrhage, mass, midline shift, or extra-axial fluid collection. The ventricles and sulci are normal. The brain is normal in signal. Vascular: Major intracranial vascular flow voids are preserved. Skull and upper cervical spine: Unremarkable bone marrow signal. Sinuses/Orbits: Unremarkable orbits. Minimal mucosal thickening in the paranasal sinuses. Small bilateral mastoid effusions. Other: None. IMPRESSION: Unremarkable appearance of the brain. Electronically Signed   By: Sebastian Ache M.D.   On: 10/26/2021 16:52   EEG adult  Result Date: 10/26/2021 Jefferson Fuel, MD     10/26/2021 12:50 PM Routine EEG Report Blake Moore is a 51 y.o. male with a history of cardiac arrest who is undergoing an EEG to evaluate for seizures. Report: This EEG was acquired with electrodes placed according to the International 10-20 electrode system (including Fp1, Fp2, F3, F4, C3, C4, P3, P4, O1, O2, T3, T4, T5, T6, A1, A2, Fz, Cz, Pz). The following electrodes were missing or displaced:  none. There was no clear occipital dominant rhythm. The best background was continuous at 11-12 Hz. Some sleep spindles were identified. There was no focal slowing. There were no interictal epileptiform discharges. There were no electrographic seizures identified. Photic stimulation and hyperventilation were not performed. Impression: This EEG was obtained while intubated and sedated on propofol and fentanyl and shows no abnormalities. Bing Neighbors, MD [facility] [phone] If 7pm- 7am, please page neurology on call as listed in AMION.   DG Chest Port 1 View  Result Date: 10/26/2021 CLINICAL DATA:  Intubation EXAM: PORTABLE CHEST 1 VIEW COMPARISON:  Yesterday FINDINGS: Endotracheal tube with tip at the clavicular heads. Left IJ line with tip at the SVC. The enteric tube tip and side-port reaches the stomach. Hazy opacity at the right more than left base, progressed especially on the  right. No visible effusion or pneumothorax. Normal heart size. Artifact from EKG leads. IMPRESSION: 1. Unremarkable hardware. 2. Atelectasis at the lung bases by recent chest CT. Aeration has worsened on the right since yesterday. Electronically Signed   By: Tiburcio Pea M.D.   On: 10/26/2021 05:44   DG Chest Port 1 View  Result Date: 10/25/2021 CLINICAL DATA:  Central venous catheter placement EXAM: PORTABLE CHEST 1 VIEW COMPARISON:  Previous studies including the examination done earlier today FINDINGS: There is interval placement of left IJ central venous catheter with its tip in the superior vena cava. Tip of endotracheal tube is 6 cm above the carina. Enteric tube is noted traversing the esophagus. Central pulmonary vessels are prominent. Increased interstitial markings are seen in the parahilar regions and lower lung fields, more so in medial left lower lung fields. There is no pleural effusion or pneumothorax. IMPRESSION: Tip of left IJ central venous catheter is seen in the superior vena cava. There is  no pneumothorax. Central pulmonary vessels are prominent. Increased interstitial markings are seen in the both parahilar regions and both lower lung fields suggesting pulmonary edema or interstitial pneumonia. Electronically Signed   By: Ernie Avena M.D.   On: 10/25/2021 15:09   ECHOCARDIOGRAM COMPLETE  Result Date: 10/25/2021    ECHOCARDIOGRAM REPORT   Patient Name:   Blake Moore Date of Exam: 10/25/2021 Medical Rec #:  [id number]     Height:       73.0 in Accession #:    [id number]    Weight:       187.4 lb Date of Birth:  10/19/1970     BSA:          2.093 m Patient Age:    50 years      BP:           101/67 mmHg Patient Gender: M             HR:           81 bpm. Exam Location:  [facility] Procedure: 2D Echo, Color Doppler and Cardiac Doppler Indications:     I46.9 Cardiac arrest  History:         Patient has no prior history of Echocardiogram examinations.                  Signs/Symptoms:Chest Pain.  Sonographer:     Humphrey Rolls Referring Phys:  [id number] Lorin Glass Diagnosing Phys: Julien Nordmann MD  Sonographer Comments: Echo performed with patient supine and on artificial respirator. IMPRESSIONS  1. Left ventricular ejection fraction, by estimation, is 45 to 50%. The left ventricle has mildly decreased function. The left ventricle demonstrates global hypokinesis. Left ventricular diastolic parameters were normal.  2. Right ventricular systolic function is low normal. The right ventricular size is normal. Tricuspid regurgitation signal is inadequate for assessing PA pressure.  3. The mitral valve is normal in structure. Trivial mitral valve regurgitation. No evidence of mitral stenosis.  4. The aortic valve is tricuspid. Aortic valve regurgitation is not visualized. No aortic stenosis is present.  5. The inferior vena cava is dilated in size with >50% respiratory variability, suggesting right atrial pressure of 8 mmHg. FINDINGS  Left Ventricle: Left ventricular ejection fraction, by estimation, is 45 to  50%. The left ventricle has mildly decreased function. The left ventricle demonstrates global hypokinesis. The left ventricular internal cavity size was normal in size. There is  no left ventricular hypertrophy. Left ventricular diastolic parameters were normal. Right  Ventricle: The right ventricular size is normal. No increase in right ventricular wall thickness. Right ventricular systolic function is low normal. Tricuspid regurgitation signal is inadequate for assessing PA pressure. Left Atrium: Left atrial size was normal in size. Right Atrium: Right atrial size was normal in size. Pericardium: There is no evidence of pericardial effusion. Mitral Valve: The mitral valve is normal in structure. Trivial mitral valve regurgitation. No evidence of mitral valve stenosis. MV peak gradient, 1.5 mmHg. The mean mitral valve gradient is 1.0 mmHg. Tricuspid Valve: The tricuspid valve is normal in structure. Tricuspid valve regurgitation is mild . No evidence of tricuspid stenosis. Aortic Valve: The aortic valve is tricuspid. Aortic valve regurgitation is not visualized. No aortic stenosis is present. Aortic valve mean gradient measures 4.0 mmHg. Aortic valve peak gradient measures 6.9 mmHg. Aortic valve area, by VTI measures 3.02 cm. Pulmonic Valve: The pulmonic valve was normal in structure. Pulmonic valve regurgitation is not visualized. No evidence of pulmonic stenosis. Aorta: The aortic root is normal in size and structure. Venous: The inferior vena cava is dilated in size with greater than 50% respiratory variability, suggesting right atrial pressure of 8 mmHg. IAS/Shunts: No atrial level shunt detected by color flow Doppler.  LEFT VENTRICLE PLAX 2D LVIDd:         4.98 cm   Diastology LVIDs:         3.65 cm   LV e' medial:    10.20 cm/s LV PW:         1.07 cm   LV E/e' medial:  5.7 LV IVS:        0.78 cm   LV e' lateral:   11.40 cm/s LVOT diam:     2.40 cm   LV E/e' lateral: 5.1 LV SV:         68 LV SV Index:   33  LVOT Area:     4.52 cm  RIGHT VENTRICLE RV Basal diam:  5.39 cm RV Mid diam:    4.53 cm RV S prime:     11.20 cm/s LEFT ATRIUM             Index        RIGHT ATRIUM           Index LA diam:        3.00 cm 1.43 cm/m   RA Area:     19.90 cm LA Vol (A2C):   35.1 ml 16.77 ml/m  RA Volume:   63.40 ml  30.29 ml/m LA Vol (A4C):   27.0 ml 12.90 ml/m LA Biplane Vol: 33.5 ml 16.01 ml/m  AORTIC VALVE                    PULMONIC VALVE AV Area (Vmax):    3.52 cm     PV Vmax:       1.03 m/s AV Area (Vmean):   3.26 cm     PV Vmean:      80.600 cm/s AV Area (VTI):     3.02 cm     PV VTI:        0.177 m AV Vmax:           131.00 cm/s  PV Peak grad:  4.2 mmHg AV Vmean:          93.800 cm/s  PV Mean grad:  3.0 mmHg AV VTI:            0.226 m AV Peak Grad:  6.9 mmHg AV Mean Grad:      4.0 mmHg LVOT Vmax:         102.00 cm/s LVOT Vmean:        67.600 cm/s LVOT VTI:          0.151 m LVOT/AV VTI ratio: 0.67  AORTA Ao Root diam: 3.50 cm MITRAL VALVE MV Area (PHT): 4.46 cm    SHUNTS MV Area VTI:   4.04 cm    Systemic VTI:  0.15 m MV Peak grad:  1.5 mmHg    Systemic Diam: 2.40 cm MV Mean grad:  1.0 mmHg MV Vmax:       0.61 m/s MV Vmean:      42.5 cm/s MV Decel Time: 170 msec MV E velocity: 57.80 cm/s MV A velocity: 51.00 cm/s MV E/A ratio:  1.13 Julien Nordmann MD Electronically signed by Julien Nordmann MD Signature Date/Time: 10/25/2021/12:23:30 PM    Final    DG Abd 1 View  Result Date: 10/25/2021 CLINICAL DATA:  Nasogastric tube placement EXAM: ABDOMEN - 1 VIEW COMPARISON:  CT of earlier today FINDINGS: Nasogastric tube terminates at the body of the stomach with the side port below the GE junction. Non-obstructive bowel gas pattern. Numerous leads and wires project over the lower chest and upper abdomen. Contrast within right renal collecting system. IMPRESSION: Nasogastric tube terminating at the body of the stomach. Electronically Signed   By: Jeronimo Greaves M.D.   On: 10/25/2021 09:00   CT Cervical Spine Wo  Contrast  Result Date: 10/25/2021 CLINICAL DATA:  Status post trauma. EXAM: CT CERVICAL SPINE WITHOUT CONTRAST TECHNIQUE: Multidetector CT imaging of the cervical spine was performed without intravenous contrast. Multiplanar CT image reconstructions were also generated. RADIATION DOSE REDUCTION: This exam was performed according to the departmental dose-optimization program which includes automated exposure control, adjustment of the mA and/or kV according to patient size and/or use of iterative reconstruction technique. COMPARISON:  None Available. FINDINGS: Alignment: There is mild reversal of the normal cervical spine lordosis. Skull base and vertebrae: No acute fracture. No primary bone lesion or focal pathologic process. Soft tissues and spinal canal: No prevertebral fluid or swelling. No visible canal hematoma. Disc levels: Moderate severity endplate sclerosis and anterior osteophyte formation are seen at the levels of C4-C5, C5-C6 and C6-C7. Mild anterior osteophyte formation is also seen at the level of C7-T1. Moderate to marked severity intervertebral disc space narrowing is seen at C4-C5, C5-C6 and C6-C7. Normal, bilateral multilevel facet joints are noted. Upper chest: Negative. Other: Endotracheal and nasogastric tubes are in place. IMPRESSION: 1. No acute fracture or subluxation in the cervical spine. 2. Moderate to marked severity degenerative changes at the levels of C4-C5, C5-C6 and C6-C7. Electronically Signed   By: Aram Candela M.D.   On: 10/25/2021 03:17   CT ABDOMEN PELVIS W CONTRAST  Result Date: 10/25/2021 CLINICAL DATA:  Status post cardiac arrest. EXAM: CT ABDOMEN AND PELVIS WITH CONTRAST TECHNIQUE: Multidetector CT imaging of the abdomen and pelvis was performed using the standard protocol following bolus administration of intravenous contrast. RADIATION DOSE REDUCTION: This exam was performed according to the departmental dose-optimization program which includes automated exposure  control, adjustment of the mA and/or kV according to patient size and/or use of iterative reconstruction technique. CONTRAST:  OMNIPAQUE IOHEXOL 350 MG/ML SOLN COMPARISON:  None Available. FINDINGS: It should be noted that the study is limited secondary to patient motion. Lower chest: Moderate severity atelectasis is seen within the posterior aspect of the  bilateral lung bases. Hepatobiliary: No focal liver abnormality is seen. No gallstones, gallbladder wall thickening, or biliary dilatation. Pancreas: Unremarkable. No pancreatic ductal dilatation or surrounding inflammatory changes. Spleen: Normal in size without focal abnormality. Adrenals/Urinary Tract: Adrenal glands are unremarkable. Kidneys are normal, without renal calculi, focal lesion, or hydronephrosis. A Foley catheter is seen within the urinary bladder. Stomach/Bowel: A nasogastric tube is in place with its distal tip seen within the body of the stomach. Stomach is within normal limits. Appendix appears normal. No evidence of bowel wall thickening, distention, or inflammatory changes. Vascular/Lymphatic: No significant vascular findings are present. No enlarged abdominal or pelvic lymph nodes. Reproductive: Prostate is unremarkable. Other: No abdominal wall hernia or abnormality. No abdominopelvic ascites. Musculoskeletal: Marked severity degenerative changes are seen within the lower lumbar spine at the level of L5-S1. IMPRESSION: 1. Moderate severity atelectasis within the posterior aspect of the bilateral lung bases. 2. No acute or active process within the abdomen or pelvis. 3. Marked severity degenerative changes within the lower lumbar spine at the level of L5-S1. Electronically Signed   By: Aram Candela M.D.   On: 10/25/2021 03:15   CT Angio Chest PE W and/or Wo Contrast  Result Date: 10/25/2021 CLINICAL DATA:  Status post cardiac arrest. EXAM: CT ANGIOGRAPHY CHEST WITH CONTRAST TECHNIQUE: Multidetector CT imaging of the chest was  performed using the standard protocol during bolus administration of intravenous contrast. Multiplanar CT image reconstructions and MIPs were obtained to evaluate the vascular anatomy. RADIATION DOSE REDUCTION: This exam was performed according to the departmental dose-optimization program which includes automated exposure control, adjustment of the mA and/or kV according to patient size and/or use of iterative reconstruction technique. CONTRAST:  OMNIPAQUE IOHEXOL 350 MG/ML SOLN COMPARISON:  None Available. FINDINGS: Cardiovascular: Satisfactory opacification of the pulmonary arteries to the segmental level. No evidence of pulmonary embolism. Normal heart size. No pericardial effusion. Mediastinum/Nodes: Endotracheal and nasogastric tubes are in place. No enlarged mediastinal, hilar, or axillary lymph nodes. Thyroid gland, trachea, and esophagus demonstrate no significant findings. Lungs/Pleura: Mild to moderate severity areas of atelectasis are seen within the posterior aspect of the bilateral lower lobes. There is no evidence of a pleural effusion pneumothorax. Upper Abdomen: No acute abnormality. Musculoskeletal: No chest wall abnormality. No acute or significant osseous findings. Review of the MIP images confirms the above findings. IMPRESSION: 1. No evidence of pulmonary embolism. 2. Mild to moderate severity posterior bilateral lower lobe atelectasis. Electronically Signed   By: Aram Candela M.D.   On: 10/25/2021 03:12   CT HEAD WO CONTRAST ( )  Result Date: 10/25/2021 CLINICAL DATA:  Status post cardiac arrest. EXAM: CT HEAD WITHOUT CONTRAST TECHNIQUE: Contiguous axial images were obtained from the base of the skull through the vertex without intravenous contrast. RADIATION DOSE REDUCTION: This exam was performed according to the departmental dose-optimization program which includes automated exposure control, adjustment of the mA and/or kV according to patient size and/or use of iterative  reconstruction technique. COMPARISON:  June 27, 2017 FINDINGS: Brain: No evidence of acute infarction, hemorrhage, hydrocephalus, extra-axial collection or mass lesion/mass effect. Vascular: No hyperdense vessel or unexpected calcification. Skull: Normal. Negative for fracture or focal lesion. Sinuses/Orbits: No acute finding. Other: None. IMPRESSION: No acute intracranial abnormality. Electronically Signed   By: Aram Candela M.D.   On: 10/25/2021 03:09   DG Chest Portable 1 View  Result Date: 10/25/2021 CLINICAL DATA:  Intubated EXAM: PORTABLE CHEST 1 VIEW COMPARISON:  10/10/2021 FINDINGS: Endotracheal tube tip about 3.8 cm superior  to the carina. No pleural effusion. Streaky pulmonary opacities in the upper lobes and left base. Normal cardiac size. No pneumothorax IMPRESSION: 1. Endotracheal tube tip about 3.8 cm superior to the carina 2. Question streaky infiltrates in the upper lobes and left base Electronically Signed   By: Jasmine Pang M.D.   On: 10/25/2021 01:17   DG Chest 2 View  Result Date: 10/10/2021 CLINICAL DATA:  Chest pain. EXAM: CHEST - 2 VIEW COMPARISON:  Chest radiograph 05/27/2019. FINDINGS: No consolidation. No visible pleural effusions or pneumothorax cardiomediastinal silhouette is unchanged. No acute osseous abnormality. No significant change from the prior. IMPRESSION: No evidence of acute cardiopulmonary disease. Electronically Signed   By: Feliberto Harts M.D.   On: 10/10/2021 11:18   (Echo, Carotid, EGD, Colonoscopy, ERCP)    Subjective: Pt denies any complaints     Discharge Exam: Vitals:   10/31/21 0847 10/31/21 0954  BP: (!) 137/120 114/86  Pulse: (!) 107 94  Resp: 20   Temp: 99.2 F (37.3 C)   SpO2: 97%    Vitals:   10/31/21 0400 10/31/21 0500 10/31/21 0847 10/31/21 0954  BP: 127/74  (!) 137/120 114/86  Pulse: 89 (!) 109 (!) 107 94  Resp: (!) 25 20 20    Temp:   99.2 F (37.3 C)   TempSrc:      SpO2: 94% 93% 97%   Weight:      Height:         General: Pt is alert, awake, not in acute distress Cardiovascular: S1/S2 +, no rubs, no gallops Respiratory: CTA bilaterally, no wheezing, no rhonchi Abdominal: Soft, NT, ND, bowel sounds + Extremities: no edema, no cyanosis    The results of significant diagnostics from this hospitalization (including imaging, microbiology, ancillary and laboratory) are listed below for reference.     Microbiology: Recent Results (from the past 240 hour(s))  Urine Culture     Status: None   Collection Time: 10/25/21 12:49 AM   Specimen: Urine, Catheterized  Result Value Ref Range Status   Specimen Description   Final    URINE, CATHETERIZED Performed at [facility], [address], [place], [place]    Special Requests   Final    NONE Performed at [facility], [address], [place], [place]    Culture   Final    NO GROWTH Performed at [facility], [address], [place], [place]    Report Status 10/26/2021 FINAL  Final  Blood culture (routine x 2)     Status: None   Collection Time: 10/25/21  3:18 AM   Specimen: BLOOD  Result Value Ref Range Status   Specimen Description BLOOD LEFT FA  Final   Special Requests   Final    BOTTLES DRAWN AEROBIC AND ANAEROBIC Blood Culture results may not be optimal due to an inadequate volume of blood received in culture bottles   Culture   Final    NO GROWTH 5 DAYS Performed at [facility], [address] [place], [place]    Report Status 10/30/2021 FINAL  Final  Blood culture (routine x 2)     Status: Abnormal   Collection Time: 10/25/21  3:18 AM   Specimen: BLOOD  Result Value Ref Range Status   Specimen Description   Final    BLOOD LEFT [facility] Performed at [facility], [address] [place], [place]    Special Requests   Final  BOTTLES DRAWN AEROBIC AND ANAEROBIC Blood Culture results may not be optimal due to an inadequate volume of blood  received in culture bottles Performed at [facility], [address] [place], [place] [address]    Culture  Setup Time   Final    ANAEROBIC BOTTLE ONLY GRAM POSITIVE COCCI Organism ID to follow CRITICAL RESULT CALLED TO, READ BACK BY AND VERIFIED WITH: JASON ROBBINS @ 10/26/21 0013 LFD    Culture (A)  Final    STAPHYLOCOCCUS HOMINIS THE SIGNIFICANCE OF ISOLATING THIS ORGANISM FROM A SINGLE SET OF BLOOD CULTURES WHEN MULTIPLE SETS ARE DRAWN IS UNCERTAIN. PLEASE NOTIFY THE MICROBIOLOGY DEPARTMENT WITHIN ONE WEEK IF SPECIATION AND SENSITIVITIES ARE REQUIRED. Performed at [facility], [address], [place], [place] [address]    Report Status 10/28/2021 FINAL  Final  Blood Culture ID Panel (Reflexed)     Status: Abnormal   Collection Time: 10/25/21  3:18 AM  Result Value Ref Range Status   Enterococcus faecalis NOT DETECTED NOT DETECTED Final   Enterococcus Faecium NOT DETECTED NOT DETECTED Final   Listeria monocytogenes NOT DETECTED NOT DETECTED Final   Staphylococcus species DETECTED (A) NOT DETECTED Final    Comment: CRITICAL RESULT CALLED TO, READ BACK BY AND VERIFIED WITH: JASON ROBBINS @ 0013 10/26/21 LFD    Staphylococcus aureus (BCID) NOT DETECTED NOT DETECTED Final   Staphylococcus epidermidis NOT DETECTED NOT DETECTED Final   Staphylococcus lugdunensis NOT DETECTED NOT DETECTED Final   Streptococcus species NOT DETECTED NOT DETECTED Final   Streptococcus agalactiae NOT DETECTED NOT DETECTED Final   Streptococcus pneumoniae NOT DETECTED NOT DETECTED Final   Streptococcus pyogenes NOT DETECTED NOT DETECTED Final   A.calcoaceticus-baumannii NOT DETECTED NOT DETECTED Final   Bacteroides fragilis NOT DETECTED NOT DETECTED Final   Enterobacterales NOT DETECTED NOT DETECTED Final   Enterobacter cloacae complex NOT DETECTED NOT DETECTED Final   Escherichia coli NOT DETECTED NOT DETECTED Final   Klebsiella aerogenes NOT DETECTED NOT DETECTED Final   Klebsiella  oxytoca NOT DETECTED NOT DETECTED Final   Klebsiella pneumoniae NOT DETECTED NOT DETECTED Final   Proteus species NOT DETECTED NOT DETECTED Final   Salmonella species NOT DETECTED NOT DETECTED Final   Serratia marcescens NOT DETECTED NOT DETECTED Final   Haemophilus influenzae NOT DETECTED NOT DETECTED Final   Neisseria meningitidis NOT DETECTED NOT DETECTED Final   Pseudomonas aeruginosa NOT DETECTED NOT DETECTED Final   Stenotrophomonas maltophilia NOT DETECTED NOT DETECTED Final   Candida albicans NOT DETECTED NOT DETECTED Final   Candida auris NOT DETECTED NOT DETECTED Final   Candida glabrata NOT DETECTED NOT DETECTED Final   Candida krusei NOT DETECTED NOT DETECTED Final   Candida parapsilosis NOT DETECTED NOT DETECTED Final   Candida tropicalis NOT DETECTED NOT DETECTED Final   Cryptococcus neoformans/gattii NOT DETECTED NOT DETECTED Final    Comment: Performed at [facility], [address] [place], [place] [address]  MRSA Next Gen by PCR, Nasal     Status: None   Collection Time: 10/25/21  5:33 AM   Specimen: Nasal Mucosa; Nasal Swab  Result Value Ref Range Status   MRSA by PCR Next Gen NOT DETECTED NOT DETECTED Final    Comment: (NOTE) The GeneXpert MRSA Assay (FDA approved for NASAL specimens only), is one component of a comprehensive MRSA colonization surveillance program. It is not intended to diagnose MRSA infection nor to guide or monitor treatment for MRSA infections. Test performance is not FDA approved in patients  less than 27 years old. Performed at [facility], [address] [place], [place] [address]   Culture, Respiratory w Gram Stain     Status: None   Collection Time: 10/26/21  5:21 AM   Specimen: Tracheal Aspirate; Respiratory  Result Value Ref Range Status   Specimen Description   Final    TRACHEAL ASPIRATE Performed at [facility], [address] [place], [place] [address]    Special Requests   Final     NONE Performed at [facility], [address] [place], [place] [address]    Gram Stain   Final    FEW WBC PRESENT, PREDOMINANTLY PMN FEW GRAM POSITIVE COCCI IN PAIRS AND CHAINS Performed at [facility], [address] [place], [place] [address]    Culture ABUNDANT STREPTOCOCCUS PNEUMONIAE  Final   Report Status 10/28/2021 FINAL  Final   Organism ID, Bacteria STREPTOCOCCUS PNEUMONIAE  Final      Susceptibility   Streptococcus pneumoniae - MIC*    ERYTHROMYCIN >=8 RESISTANT Resistant     LEVOFLOXACIN 1 SENSITIVE Sensitive     VANCOMYCIN 0.5 SENSITIVE Sensitive     PENICILLIN (meningitis) <=0.06 SENSITIVE Sensitive     PENO - penicillin <=0.06      PENICILLIN (non-meningitis) <=0.06 SENSITIVE Sensitive     PENICILLIN (oral) <=0.06 SENSITIVE Sensitive     CEFTRIAXONE (non-meningitis) <=0.12 SENSITIVE Sensitive     CEFTRIAXONE (meningitis) <=0.12 SENSITIVE Sensitive     * ABUNDANT STREPTOCOCCUS PNEUMONIAE  Surgical PCR screen     Status: None   Collection Time: 10/30/21  6:36 AM   Specimen: Nasal Mucosa; Nasal Swab  Result Value Ref Range Status   MRSA, PCR NEGATIVE NEGATIVE Final   Staphylococcus aureus NEGATIVE NEGATIVE Final    Comment: (NOTE) The Xpert SA Assay (FDA approved for NASAL specimens in patients 64 years of age and older), is one component of a comprehensive surveillance program. It is not intended to diagnose infection nor to guide or monitor treatment. Performed at [facility], [address] [place], [place] [address]      Labs: BNP (last 3 results) Recent Labs    10/25/21 0049  BNP 25.5   Basic Metabolic Panel: Recent Labs  Lab 10/25/21 0615 10/25/21 0920 10/26/21 0521 10/27/21 0509 10/28/21 0453 10/29/21 0407 10/30/21 0304 10/31/21 0312  NA  --    < > 140 137 142 141 139 138  K  --    < > 3.6 3.8 3.4* 3.7 3.7 4.0  CL  --    < > 110 106 108 111 106 107  CO2  --    < > GLUCOSE  --    < > 104*  129* 109* 104* 108* 116*  BUN  --    < > CREATININE  --    < > 0.69 0.82 0.74 0.70 0.81 0.70  CALCIUM  --    < > 8.4* 7.8* 8.5* 8.3* 8.4* 9.0  MG 2.1  --  2.0 2.0 1.7 1.8 1.7 1.9  PHOS 3.3  --  3.1 1.9* 2.5 2.9  --   --    < > = values in this interval not displayed.   Liver Function Tests: Recent Labs  Lab 10/25/21 0049 10/25/21 0920  AST 126* 193*  ALT 101* 99*  ALKPHOS 93 66  BILITOT 0.3 0.4  PROT 7.1 6.1*  ALBUMIN 3.7  3.2*   No results for input(s): "LIPASE", "AMYLASE" in the last 168 hours. No results for input(s): "AMMONIA" in the last 168 hours. CBC: Recent Labs  Lab 10/25/21 0049 10/25/21 0615 10/25/21 0920 10/26/21 0521 10/27/21 0509 10/28/21 0453 10/29/21 0407 10/31/21 0312  WBC 9.2   < > 9.1 17.3* 12.2* 12.0* 7.6 10.5  NEUTROABS 3.3  --  7.3 15.6*  --   --   --   --   HGB 13.3   < > 11.9* 13.1 10.5* 10.6* 10.4* 12.8*  HCT 43.7   < > 37.7* 42.1 33.7* 34.1* 33.1* 40.4  MCV 86.5   < > 84.3 84.9 85.1 85.0 84.0 81.1  PLT 364   < > 275 287 226 243 259 326   < > = values in this interval not displayed.   Cardiac Enzymes: No results for input(s): "CKTOTAL", "CKMB", "CKMBINDEX", "TROPONINI" in the last 168 hours. BNP: Invalid input(s): "POCBNP" CBG: Recent Labs  Lab 10/26/21 1605 10/26/21 2028 10/26/21 2339 10/27/21 0431 10/27/21 0741  GLUCAP 95 153* 155* 116* 158*   D-Dimer No results for input(s): "DDIMER" in the last 72 hours. Hgb A1c No results for input(s): "HGBA1C" in the last 72 hours. Lipid Profile No results for input(s): "CHOL", "HDL", "LDLCALC", "TRIG", "CHOLHDL", "LDLDIRECT" in the last 72 hours. Thyroid function studies No results for input(s): "TSH", "T4TOTAL", "T3FREE", "THYROIDAB" in the last 72 hours.  Invalid input(s): "FREET3" Anemia work up No results for input(s): "VITAMINB12", "FOLATE", "FERRITIN", "TIBC", "IRON", "RETICCTPCT" in the last 72 hours. Urinalysis    Component Value Date/Time   COLORURINE  YELLOW (A) 10/25/2021 0049   APPEARANCEUR HAZY (A) 10/25/2021 0049   LABSPEC 1.019 10/25/2021 0049   PHURINE 6.0 10/25/2021 0049   GLUCOSEU 50 (A) 10/25/2021 0049   HGBUR NEGATIVE 10/25/2021 0049   BILIRUBINUR NEGATIVE 10/25/2021 0049   KETONESUR NEGATIVE 10/25/2021 0049   PROTEINUR 100 (A) 10/25/2021 0049   NITRITE NEGATIVE 10/25/2021 0049   LEUKOCYTESUR NEGATIVE 10/25/2021 0049   Sepsis Labs Recent Labs  Lab 10/27/21 0509 10/28/21 0453 10/29/21 0407 10/31/21 0312  WBC 12.2* 12.0* 7.6 10.5   Microbiology Recent Results (from the past 240 hour(s))  Urine Culture     Status: None   Collection Time: 10/25/21 12:49 AM   Specimen: Urine, Catheterized  Result Value Ref Range Status   Specimen Description   Final    URINE, CATHETERIZED Performed at [facility], [address], [place], [place] [address]    Special Requests   Final    NONE Performed at [facility], [address] [place], [place] [address]    Culture   Final    NO GROWTH Performed at [facility], [address], [place], [place] [address]    Report Status 10/26/2021 FINAL  Final  Blood culture (routine x 2)     Status: None   Collection Time: 10/25/21  3:18 AM   Specimen: BLOOD  Result Value Ref Range Status   Specimen Description BLOOD LEFT FA  Final   Special Requests   Final    BOTTLES DRAWN AEROBIC AND ANAEROBIC Blood Culture results may not be optimal due to an inadequate volume of blood received in culture bottles   Culture   Final    NO GROWTH 5 DAYS Performed at [facility], [address] [place], [place] [address]    Report Status 10/30/2021 FINAL  Final  Blood culture (routine x 2)     Status:  Abnormal   Collection Time: 10/25/21  3:18 AM   Specimen: BLOOD  Result Value Ref Range Status   Specimen Description   Final    BLOOD LEFT AC Performed at [facility], [address] [place], [place] [address]    Special Requests   Final     BOTTLES DRAWN AEROBIC AND ANAEROBIC Blood Culture results may not be optimal due to an inadequate volume of blood received in culture bottles Performed at [facility], [address] [place], [place] [address]    Culture  Setup Time   Final    ANAEROBIC BOTTLE ONLY GRAM POSITIVE COCCI Organism ID to follow CRITICAL RESULT CALLED TO, READ BACK BY AND VERIFIED WITH: JASON ROBBINS @ 10/26/21 0013 LFD    Culture (A)  Final    STAPHYLOCOCCUS HOMINIS THE SIGNIFICANCE OF ISOLATING THIS ORGANISM FROM A SINGLE SET OF BLOOD CULTURES WHEN MULTIPLE SETS ARE DRAWN IS UNCERTAIN. PLEASE NOTIFY THE MICROBIOLOGY DEPARTMENT WITHIN ONE WEEK IF SPECIATION AND SENSITIVITIES ARE REQUIRED. Performed at [facility], [address], [place], [place] [address]    Report Status 10/28/2021 FINAL  Final  Blood Culture ID Panel (Reflexed)     Status: Abnormal   Collection Time: 10/25/21  3:18 AM  Result Value Ref Range Status   Enterococcus faecalis NOT DETECTED NOT DETECTED Final   Enterococcus Faecium NOT DETECTED NOT DETECTED Final   Listeria monocytogenes NOT DETECTED NOT DETECTED Final   Staphylococcus species DETECTED (A) NOT DETECTED Final    Comment: CRITICAL RESULT CALLED TO, READ BACK BY AND VERIFIED WITH: JASON ROBBINS @ 0013 10/26/21 LFD    Staphylococcus aureus (BCID) NOT DETECTED NOT DETECTED Final   Staphylococcus epidermidis NOT DETECTED NOT DETECTED Final   Staphylococcus lugdunensis NOT DETECTED NOT DETECTED Final   Streptococcus species NOT DETECTED NOT DETECTED Final   Streptococcus agalactiae NOT DETECTED NOT DETECTED Final   Streptococcus pneumoniae NOT DETECTED NOT DETECTED Final   Streptococcus pyogenes NOT DETECTED NOT DETECTED Final   A.calcoaceticus-baumannii NOT DETECTED NOT DETECTED Final   Bacteroides fragilis NOT DETECTED NOT DETECTED Final   Enterobacterales NOT DETECTED NOT DETECTED Final   Enterobacter cloacae complex NOT DETECTED NOT DETECTED Final    Escherichia coli NOT DETECTED NOT DETECTED Final   Klebsiella aerogenes NOT DETECTED NOT DETECTED Final   Klebsiella oxytoca NOT DETECTED NOT DETECTED Final   Klebsiella pneumoniae NOT DETECTED NOT DETECTED Final   Proteus species NOT DETECTED NOT DETECTED Final   Salmonella species NOT DETECTED NOT DETECTED Final   Serratia marcescens NOT DETECTED NOT DETECTED Final   Haemophilus influenzae NOT DETECTED NOT DETECTED Final   Neisseria meningitidis NOT DETECTED NOT DETECTED Final   Pseudomonas aeruginosa NOT DETECTED NOT DETECTED Final   Stenotrophomonas maltophilia NOT DETECTED NOT DETECTED Final   Candida albicans NOT DETECTED NOT DETECTED Final   Candida auris NOT DETECTED NOT DETECTED Final   Candida glabrata NOT DETECTED NOT DETECTED Final   Candida krusei NOT DETECTED NOT DETECTED Final   Candida parapsilosis NOT DETECTED NOT DETECTED Final   Candida tropicalis NOT DETECTED NOT DETECTED Final   Cryptococcus neoformans/gattii NOT DETECTED NOT DETECTED Final    Comment: Performed at [facility], [address] [place], [place] [address]  MRSA Next Gen by PCR, Nasal     Status: None   Collection Time: 10/25/21  5:33 AM   Specimen: Nasal Mucosa; Nasal Swab  Result Value Ref Range Status   MRSA by PCR Next Gen  NOT DETECTED NOT DETECTED Final    Comment: (NOTE) The GeneXpert MRSA Assay (FDA approved for NASAL specimens only), is one component of a comprehensive MRSA colonization surveillance program. It is not intended to diagnose MRSA infection nor to guide or monitor treatment for MRSA infections. Test performance is not FDA approved in patients less than 9 years old. Performed at [facility], [address] [place], [place] [address]   Culture, Respiratory w Gram Stain     Status: None   Collection Time: 10/26/21  5:21 AM   Specimen: Tracheal Aspirate; Respiratory  Result Value Ref Range Status   Specimen Description   Final    TRACHEAL  ASPIRATE Performed at [facility], [address] [place], [place] [address]    Special Requests   Final    NONE Performed at [facility], [address] [place], [place] [address]    Gram Stain   Final    FEW WBC PRESENT, PREDOMINANTLY PMN FEW GRAM POSITIVE COCCI IN PAIRS AND CHAINS Performed at [facility], [address], [place], [place] [address]    Culture ABUNDANT STREPTOCOCCUS PNEUMONIAE  Final   Report Status 10/28/2021 FINAL  Final   Organism ID, Bacteria STREPTOCOCCUS PNEUMONIAE  Final      Susceptibility   Streptococcus pneumoniae - MIC*    ERYTHROMYCIN >=8 RESISTANT Resistant     LEVOFLOXACIN 1 SENSITIVE Sensitive     VANCOMYCIN 0.5 SENSITIVE Sensitive     PENICILLIN (meningitis) <=0.06 SENSITIVE Sensitive     PENO - penicillin <=0.06      PENICILLIN (non-meningitis) <=0.06 SENSITIVE Sensitive     PENICILLIN (oral) <=0.06 SENSITIVE Sensitive     CEFTRIAXONE (non-meningitis) <=0.12 SENSITIVE Sensitive     CEFTRIAXONE (meningitis) <=0.12 SENSITIVE Sensitive     * ABUNDANT STREPTOCOCCUS PNEUMONIAE  Surgical PCR screen     Status: None   Collection Time: 10/30/21  6:36 AM   Specimen: Nasal Mucosa; Nasal Swab  Result Value Ref Range Status   MRSA, PCR NEGATIVE NEGATIVE Final   Staphylococcus aureus NEGATIVE NEGATIVE Final    Comment: (NOTE) The Xpert SA Assay (FDA approved for NASAL specimens in patients 42 years of age and older), is one component of a comprehensive surveillance program. It is not intended to diagnose infection nor to guide or monitor treatment. Performed at [facility], [address], [place], [place] [address]      Time coordinating discharge: Over 30 minutes  SIGNED:   Charise Killian, MD  [facility] 10/31/2021, 1:07 PM Pager   If 7PM-7AM, please contact night-coverage [email or web address]

## 2021-10-31 NOTE — Progress Notes (Signed)
Progress Note  Patient Name: Blake Moore Date of Encounter: 10/31/2021  [facility] Cardiologist: Lorine Bears, MD   Subjective   Feels well this morning no complaints would like to go home Reports that he works at [employer] and [address] Mother and daughter coming in from out of town to see him today Very mild discomfort at ICD site Device rep in this morning to interrogate  Inpatient Medications    Scheduled Meds:  carvedilol  3.125 mg Oral Once   carvedilol  6.25 mg Oral BID WC   Chlorhexidine Gluconate Cloth  6 each Topical Q0600   enoxaparin (LOVENOX) injection  40 mg Subcutaneous Q24H   feeding supplement  237 mL Oral TID BM   multivitamin with minerals  1 tablet Oral Daily   NIFEdipine  30 mg Oral Daily   pantoprazole  40 mg Oral Daily   polyethylene glycol  17 g Oral Daily   rosuvastatin  10 mg Oral Daily   sodium chloride flush  3 mL Intravenous Q12H   Continuous Infusions:  sodium chloride     PRN Meds: sodium chloride, acetaminophen, guaiFENesin-dextromethorphan, ondansetron (ZOFRAN) IV, sodium chloride flush   Vital Signs    Vitals:   10/31/21 0300 10/31/21 0400 10/31/21 0500 10/31/21 0847  BP:  127/74  (!) 137/120  Pulse: 83 89 (!) 109 (!) 107  Resp: (!) 24 (!) 25 20 20   Temp: 99.8 F (37.7 C)   99.2 F (37.3 C)  TempSrc: Oral     SpO2: 95% 94% 93% 97%  Weight:      Height:        Intake/Output Summary (Last 24 hours) at 10/31/2021 0907 Last data filed at 10/30/2021 1800 Gross per 24 hour  Intake 100 ml  Output 1200 ml  Net -1100 ml      10/30/2021    7:24 AM 10/30/2021    5:00 AM 10/28/2021    5:00 AM  Last 3 Weights  Weight (lbs) 165 lb 2 oz 165 lb 2 oz 176 lb 2.4 oz  Weight (kg) 74.9 kg 74.9 kg 79.9 kg      Telemetry    Sinus tachycardia- Personally Reviewed  ECG     - Personally Reviewed  Physical Exam   GEN: No acute distress.   Neck: No JVD Cardiac: RRR, no murmurs, rubs, or gallops.  Respiratory: Clear to  auscultation bilaterally. GI: Soft, nontender, non-distended  MS: No edema; No deformity.  Left arm in sling  Neuro:  Nonfocal  Psych: Normal affect   Labs    High Sensitivity Troponin:   Recent Labs  Lab 10/25/21 0049 10/25/21 0318 10/25/21 0920 10/25/21 1115 10/30/21 0304  TROPONINIHS 16 477* 1,406* 1,437* 16     Chemistry Recent Labs  Lab 10/25/21 0049 10/25/21 0615 10/25/21 0920 10/26/21 0521 10/29/21 0407 10/30/21 0304 10/31/21 0312  NA 140  --  140   < > 141 139 138  K 2.7*  --  3.9   < > 3.7 3.7 4.0  CL 107  --  110   < > 111 106 107  CO2 19*  --  25   < > 26 25 25   GLUCOSE 242*  --  104*   < > 104* 108* 116*  BUN 18  --  19   < > 15 15 13   CREATININE 1.09  --  0.84   < > 0.70 0.81 0.70  CALCIUM 8.3*  --  7.9*   < > 8.3*  8.4* 9.0  MG 2.3   < >  --    < > 1.8 1.7 1.9  PROT 7.1  --  6.1*  --   --   --   --   ALBUMIN 3.7  --  3.2*  --   --   --   --   AST 126*  --  193*  --   --   --   --   ALT 101*  --  99*  --   --   --   --   ALKPHOS 93  --  66  --   --   --   --   BILITOT 0.3  --  0.4  --   --   --   --   GFRNONAA >60  --  >60   < > >60 >60 >60  ANIONGAP 14  --  5   < > 4* 8 6   < > = values in this interval not displayed.    Lipids  Recent Labs  Lab 10/25/21 1115 10/26/21 0521  CHOL 124  --   TRIG 38 73  HDL 41  --   LDLCALC 75  --   CHOLHDL 3.0  --     Hematology Recent Labs  Lab 10/28/21 0453 10/29/21 0407 10/31/21 0312  WBC 12.0* 7.6 10.5  RBC 4.01* 3.94* 4.98  HGB 10.6* 10.4* 12.8*  HCT 34.1* 33.1* 40.4  MCV 85.0 84.0 81.1  MCH 26.4 26.4 25.7*  MCHC 31.1 31.4 31.7  RDW 12.7 12.7 12.6  PLT 243 259 326   Thyroid  Recent Labs  Lab 10/25/21 0049  TSH 0.134*    BNP Recent Labs  Lab 10/25/21 0049  BNP 25.5    DDimer No results for input(s): "DDIMER" in the last 168 hours.   Radiology    DG Chest 2 View  Result Date: 10/31/2021 CLINICAL DATA:  ICD EXAM: CHEST - 2 VIEW COMPARISON:  10/26/2021 FINDINGS: New left chest  wall single lead ICD. Lead overlies the right ventricle. No pneumothorax. Persistent but improved patchy opacities at the lung bases. Normal heart size. IMPRESSION: Single lead ICD with tip overlying the right ventricle. No pneumothorax. Persistent but improved basilar opacities. Electronically Signed   By: Guadlupe Spanish M.D.   On: 10/31/2021 08:26   EP PPM/ICD IMPLANT  Result Date: 10/30/2021  CONCLUSIONS:  1. Nonischemic cardiomyopathy and a history of resuscitated VF arrest  2. Successful ICD implantation with a [employer] VVI ICD implanted for secondary prevention of sudden death.  3.  No early apparent complications.   MR CARDIAC MORPHOLOGY W WO CONTRAST  Result Date: 10/29/2021 CLINICAL DATA:  Cardiac arrest, evaluate for fibrosis EXAM: CARDIAC MRI TECHNIQUE: The patient was scanned on a 1.5 Tesla Siemens magnet. A dedicated cardiac coil was used. Functional imaging was done using Fiesta sequences. 2,3, and 4 chamber views were done to assess for RWMA's. Modified Simpson's rule using a short axis stack was used to calculate an ejection fraction on a dedicated work [employer]. The patient received 10 cc of Gadavist. After 10 minutes inversion recovery sequences were used to assess for infiltration and scar tissue. CONTRAST:  10 cc  of Gadavist FINDINGS: 1. Mildly dilated LV size and mildly reduced systolic function (LVEF = 49.5%). There is global hypokinesis with no regional wall motion abnormalities. There is normal LV wall thickness. There is no late gadolinium enhancement in the left ventricular myocardium. Mildly abnormal/elevated ECV value of  35.1% Slightly elevated T1 value of 1066 LVEDV: 219 ml LVESV: 111 ml SV: 108.8 ml Myocardial mass: 131g 2. Normal right ventricular size, thickness. Moderately reduced systolic function (RVEF = 38%). There is global hypokinesis with no regional wall motion abnormalities. 3.  Normal left and right atrial size. 4. Normal size of the  aortic root, ascending aorta and pulmonary artery. 5.  Mild tricuspid regurgitation. 6.  Normal pericardium.  Small circumferential pericardial effusion. IMPRESSION: 1. Mildly dilated LV, mildly reduced LV systolic function, LVEF = 49.5%. 2.  No late gadolinium enhancement or scar noted. 3. Abnormal LV extracellular volume (ECV) and T1 values indicating presence of interstitial edema. 4.  Moderately reduced RV function. Electronically Signed   By: Debbe Odea M.D.   On: 10/29/2021 15:55    Cardiac Studies   Cardiac catheterization October 28, 2021 1.  Normal coronary arteries. 2.  Left ventricular angiography was not diagnostic due to a run of ventricular tachycardia.  EF was 45 to 50% by echo. 3.  Mildly elevated left ventricular end-diastolic pressure.   Recommendations: The patient has no evidence of coronary artery disease to explain his ventricular fibrillation cardiac arrest.  Recommend EP evaluation.   Cardiac MRI 1. Mildly dilated LV, mildly reduced LV systolic function, LVEF = 49.5%.   2.  No late gadolinium enhancement or scar noted.   3. Abnormal LV extracellular volume (ECV) and T1 values indicating presence of interstitial edema.   4.  Moderately reduced RV function.  Patient Profile     51 y.o. male with no significant medical history who is being seen for evaluation of cardiac arrest.  Assessment & Plan    Out of hospital cardiac arrest Initial rhythm ventricular fibrillation Echocardiogram mildly reduced LV function, troponin 1400 Cardiac catheterization no significant coronary disease June 12 Cardiac MRI mildly reduced ejection fraction estimated 49%, moderately reduced RV function, no late gadolinium enhancement or scar -Tachycardic this morning rates greater than 100 after carvedilol 3.125 Recommend we increase carvedilol up to 6.25 twice daily,  If blood pressure tolerates we will add low-dose ARB such as losartan 12.5 daily  Encephalopathy Has made  excellent recovery postarrest Initially intubated, extubated with no complications 5 days ago Alert, oriented, conversant, appropriate  S/p ICD Placed yesterday by EP, no complications Single-chamber, single coil [employer] device.  Will need follow-up with EP in clinic   Total encounter time more than 50 minutes  Greater than 50% was spent in counseling and coordination of care with the patient   For questions or updates, please contact [facility] Please consult [email or web address] for contact info under        Signed, Julien Nordmann, MD  10/31/2021, 9:07 AM

## 2021-10-31 NOTE — Discharge Instructions (Signed)
**  PLEASE REMEMBER TO BRING ALL OF YOUR MEDICATIONS TO EACH OF YOUR FOLLOW-UP OFFICE VISITS.     Supplemental Discharge Instructions for  Pacemaker/Defibrillator Patients  Activity No heavy lifting or vigorous activity with your left/right arm for 6 to 8 weeks.  Do not raise your left/right arm above your head for one week.  Gradually raise your affected arm as drawn below.                       7/21                          7/22                           7/23                       7/24          NO DRIVING  WOUND CARE Keep the wound area clean and dry.  Do not get this area wet for one week. No showers for one week; you may shower on     . The tape/steri-strips on your wound will fall off; do not pull them off.  No bandage is needed on the site.  DO  NOT apply any creams, oils, or ointments to the wound area. If you notice any drainage or discharge from the wound, any swelling or bruising at the site, or you develop a fever > 101? F after you are discharged home, call the office at once.  Special Instructions You are still able to use cellular telephones; use the ear opposite the side where you have your pacemaker/defibrillator.  Avoid carrying your cellular phone near your device. When traveling through airports, show security personnel your identification card to avoid being screened in the metal detectors.  Ask the security personnel to use the hand wand. Avoid arc welding equipment, MRI testing (magnetic resonance imaging), TENS units (transcutaneous nerve stimulators).  Call the office for questions about other devices. Avoid electrical appliances that are in poor condition or are not properly grounded. Microwave ovens are safe to be near or to operate.  Additional information for defibrillator patients should your device go off: If your device goes off ONCE and you feel fine afterward, notify the device clinic nurses. If your device goes off ONCE and you do not feel well  afterward, call 911. If your device goes off TWICE, call 911. If your device goes off THREE times in one day, call 911.  DO NOT DRIVE YOURSELF OR A FAMILY MEMBER WITH A DEFIBRILLATOR TO THE HOSPITAL--CALL 911.

## 2021-10-31 NOTE — TOC Progression Note (Signed)
Transition of Care [facility]) - Progression Note    Patient Details  Name: Blake Moore MRN: [id number] Date of Birth: October 26, 1970  Transition of Care [facility]) CM/SW Contact  Gildardo Griffes, [place] Phone Number: 10/31/2021, 11:20 AM  Clinical Narrative:     CSW followed up with patient, provided hard copy list of PCP's in network with his [employer] health insurance. Patient agreeable to identify a PCP and follow up.   No further needs noted.   Expected Discharge Plan: Home/Self Care Barriers to Discharge: Continued Medical Work up  Expected Discharge Plan and Services Expected Discharge Plan: Home/Self Care   Discharge Planning Services: CM Consult   Living arrangements for the past 2 months: Apartment                                       Social Determinants of Health (SDOH) Interventions    Readmission Risk Interventions     No data to display

## 2021-11-14 ENCOUNTER — Ambulatory Visit (INDEPENDENT_AMBULATORY_CARE_PROVIDER_SITE_OTHER): Payer: 59

## 2021-11-14 DIAGNOSIS — I428 Other cardiomyopathies: Secondary | ICD-10-CM | POA: Diagnosis not present

## 2021-11-14 DIAGNOSIS — I4901 Ventricular fibrillation: Secondary | ICD-10-CM | POA: Diagnosis not present

## 2021-11-14 DIAGNOSIS — I469 Cardiac arrest, cause unspecified: Secondary | ICD-10-CM | POA: Diagnosis not present

## 2021-11-14 LAB — CUP PACEART INCLINIC DEVICE CHECK
Date Time Interrogation Session: 20230629000000
HighPow Impedance: 66 Ohm
Implantable Lead Implant Date: 20230614
Implantable Lead Location: 753860
Implantable Lead Model: 673
Implantable Lead Serial Number: 196637
Implantable Pulse Generator Implant Date: 20230614
Lead Channel Impedance Value: 453 Ohm
Lead Channel Pacing Threshold Amplitude: 0.9 V
Lead Channel Pacing Threshold Pulse Width: 0.4 ms
Lead Channel Sensing Intrinsic Amplitude: 20.2 mV
Lead Channel Setting Pacing Amplitude: 3.5 V
Lead Channel Setting Pacing Pulse Width: 0.4 ms
Lead Channel Setting Sensing Sensitivity: 0.6 mV
Pulse Gen Serial Number: 287113

## 2021-11-14 NOTE — Progress Notes (Signed)
Wound check appointment. Steri-strips removed. Wound without redness or edema. Incision edges approximated, wound well healed. Normal device function. Thresholds, sensing, and impedances consistent with implant measurements. Device programmed at 3.5V for extra safety margin until 3 month visit. Histogram distribution appropriate for patient and level of activity. 8 Non-Sust V events 2 EGMs available longest episode 6 seconds EGMS illustrate SVT with rhythm ID correlated  Patient educated about wound care, arm mobility, lifting restrictions, shock plan. ROV 02/05/2022 with CL/B

## 2021-11-14 NOTE — Patient Instructions (Signed)
   After Your ICD (Implantable Cardiac Defibrillator)    Monitor your defibrillator site for redness, swelling, and drainage. Call the device clinic at [phone] if you experience these symptoms or fever/chills.  Your incision was closed with Steri-strips or staples:  You may shower 7 days after your procedure and wash your incision with soap and water. Avoid lotions, ointments, or perfumes over your incision until it is well-healed.  You may use a hot tub or a pool after your wound check appointment if the incision is completely closed.  Do not lift, push or pull greater than 10 pounds with the affected arm until December 12 2021. There are no other restrictions in arm movement after your wound check appointment.  Your ICD is designed to protect you from life threatening heart rhythms. Because of this, you may receive a shock.   1 shock with no symptoms:  Call the office during business hours. 1 shock with symptoms (chest pain, chest pressure, dizziness, lightheadedness, shortness of breath, overall feeling unwell):  Call 911. If you experience 2 or more shocks in 24 hours:  Call 911. If you receive a shock, you should not drive.  [place] DMV - no driving for 6 months if you receive appropriate therapy from your ICD.   ICD Alerts:  Some alerts are vibratory and others beep. These are NOT emergencies. Please call our office to let us know. If this occurs at night or on weekends, it can wait until the next business day. Send a remote transmission.  If your device is capable of reading fluid status (for heart failure), you will be offered monthly monitoring to review this with you.   Remote monitoring is used to monitor your ICD from home. This monitoring is scheduled every 91 days by our office. It allows [place] to keep an eye on the functioning of your device to ensure it is working properly. You will routinely see your Electrophysiologist annually (more often if necessary).

## 2022-01-13 ENCOUNTER — Emergency Department
Admission: EM | Admit: 2022-01-13 | Discharge: 2022-01-13 | Disposition: A | Discharge status: Home / Self Care | Payer: 59 | Attending: Emergency Medicine | Admitting: Emergency Medicine

## 2022-01-13 ENCOUNTER — Other Ambulatory Visit: Payer: Self-pay

## 2022-01-13 DIAGNOSIS — R1032 Left lower quadrant pain: Secondary | ICD-10-CM | POA: Insufficient documentation

## 2022-01-13 LAB — URINALYSIS, ROUTINE W REFLEX MICROSCOPIC
Bilirubin Urine: NEGATIVE
Glucose, UA: NEGATIVE mg/dL
Hgb urine dipstick: NEGATIVE
Ketones, ur: NEGATIVE mg/dL
Leukocytes,Ua: NEGATIVE
Nitrite: NEGATIVE
Protein, ur: NEGATIVE mg/dL
Specific Gravity, Urine: 1.016 (ref 1.005–1.030)
pH: 7 (ref 5.0–8.0)

## 2022-01-13 LAB — COMPREHENSIVE METABOLIC PANEL
ALT: 45 U/L — ABNORMAL HIGH (ref 0–44)
AST: 28 U/L (ref 15–41)
Albumin: 3.9 g/dL (ref 3.5–5.0)
Alkaline Phosphatase: 98 U/L (ref 38–126)
Anion gap: 6 (ref 5–15)
BUN: 12 mg/dL (ref 6–20)
CO2: 26 mmol/L (ref 22–32)
Calcium: 9.4 mg/dL (ref 8.9–10.3)
Chloride: 106 mmol/L (ref 98–111)
Creatinine, Ser: 0.62 mg/dL (ref 0.61–1.24)
GFR, Estimated: >60 mL/min (ref >60)
Glucose, Bld: 104 mg/dL — ABNORMAL HIGH (ref 70–99)
Potassium: 4.5 mmol/L (ref 3.5–5.1)
Sodium: 138 mmol/L (ref 135–145)
Total Bilirubin: 0.4 mg/dL (ref 0.3–1.2)
Total Protein: 7.1 g/dL (ref 6.5–8.1)

## 2022-01-13 MED ORDER — ROSUVASTATIN CALCIUM 10 MG PO TABS
10.0000 mg | ORAL_TABLET | Freq: Every day | ORAL | 0 refills | Status: DC
Start: 2022-01-13 — End: 2022-07-15

## 2022-01-13 MED ORDER — LOSARTAN POTASSIUM 25 MG PO TABS: 12.5000 mg | ORAL_TABLET | Freq: Every day | ORAL | 0 refills | Status: DC

## 2022-01-13 MED ORDER — ALBUTEROL SULFATE HFA 108 (90 BASE) MCG/ACT IN AERS: 2.0000 | INHALATION_SPRAY | Freq: Four times a day (QID) | RESPIRATORY_TRACT | 1 refills | Status: DC | PRN

## 2022-01-13 MED ORDER — CARVEDILOL 6.25 MG PO TABS: 6.2500 mg | ORAL_TABLET | Freq: Two times a day (BID) | ORAL | 0 refills | Status: DC

## 2022-01-13 NOTE — ED Provider Triage Note (Signed)
Emergency Medicine Provider Triage Evaluation Note  Blake Moore , a 51 y.o. male  was evaluated in triage.  Pt complains of pain left inguinal area when he coughed yesterday.  No prior hernia or injury.    Review of Systems  Positive: Left inguinal pain.   Negative: No N/V/D, no injury.  No bulging per patient  Physical Exam  BP 122/71 (BP Location: Left Arm)   Pulse 88   Temp 98.5 F (36.9 C) (Oral)   Resp 16   SpO2 100%  Gen:   Awake, no distress   Resp:  Normal effort  MSK:   Moves extremities without difficulty  Other:  Normal gait.   Medical Decision Making  Medically screening exam initiated at 9:23 AM.  Appropriate orders placed.  Blake Moore was informed that the remainder of the evaluation will be completed by another provider, this initial triage assessment does not replace that evaluation, and the importance of remaining in the ED until their evaluation is complete.     Tommi Rumps, PA-C 01/13/22 [phone]

## 2022-01-13 NOTE — ED Triage Notes (Signed)
Pt in with co left sided groin pain states for few days. NO hx of the same or injury. Denies any dysuria.

## 2022-01-13 NOTE — ED Notes (Signed)
Says left groin painf or 2 days.  Says it hurts to cough and when he moves certain ways.

## 2022-01-13 NOTE — Discharge Instructions (Signed)
We are not sure exactly what is causing the pain in your groin.  It is possible that you have a hernia although I do not appreciate a hernia today on your exam.  If you have worsening pain and noticed a bulge in that area that is not going away please return to the emergency department.  Sent your prescriptions that you should be taking daily to your pharmacy.  Please make sure you are following up with primary care and cardiology.

## 2022-01-13 NOTE — ED Provider Notes (Signed)
[facility] Provider Note    Event Date/Time   First MD Initiated Contact with Patient 01/13/22 1140     (approximate)   History   Groin Pain   HPI  Blake Moore is a 51 y.o. male past medical history of cardiomyopathy, V-fib arrest status post ICD placement presents with pain in the left inguinal region.  Has been bothering him for the last 2 days.  Pain is worse with movement and when he sits up and when he coughs.  Has not noticed any bulging in that region.  Denies abdominal pain nausea vomiting constipation.  Denies testicular pain pain with urination hematuria or discharge.     No past medical history on file.  Patient Active Problem List   Diagnosis Date Noted   Nonischemic cardiomyopathy (HCC)    Acute metabolic encephalopathy 10/28/2021   Acute respiratory failure with hypoxia and hypercapnia (HCC) 10/28/2021   Aspiration pneumonia (HCC) 10/28/2021   Hypokalemia 10/28/2021   Cardiac arrest with ventricular fibrillation (HCC) 10/25/2021     Physical Exam  Triage Vital Signs: ED Triage Vitals  Enc Vitals Group     BP 01/13/22 0922 122/71     Pulse Rate 01/13/22 0922 88     Resp 01/13/22 0922 16     Temp 01/13/22 0922 98.5 F (36.9 C)     Temp Source 01/13/22 0922 Oral     SpO2 01/13/22 0922 100 %     Weight 01/13/22 0923 175 lb (79.4 kg)     Height 01/13/22 0923 6\' 1"  (1.854 m)     Head Circumference --      Peak Flow --      Pain Score 01/13/22 0929 0     Pain Loc --      Pain Edu? --      Excl. in GC? --     Most recent vital signs: Vitals:   01/13/22 0922 01/13/22 1140  BP: 122/71 121/84  Pulse: 88 82  Resp: 16 16  Temp: 98.5 F (36.9 C)   SpO2: 100% 98%     General: Awake, no distress.  CV:  Good peripheral perfusion.  Resp:  Normal effort.  Abd:  No distention.  Abdomen is soft and nontender throughout Neuro:             Awake, Alert, Oriented x 3  Other:  No focal tenderness in the inguinal region or groin,  no masses skin changes Testicles nontender no obvious inguinal hernia   ED Results / Procedures / Treatments  Labs (all labs ordered are listed, but only abnormal results are displayed) Labs Reviewed  COMPREHENSIVE METABOLIC PANEL - Abnormal; Notable for the following components:      Result Value   Glucose, Bld 104 (*)    ALT 45 (*)    All other components within normal limits  URINALYSIS, ROUTINE W REFLEX MICROSCOPIC - Abnormal; Notable for the following components:   Color, Urine YELLOW (*)    APPearance CLEAR (*)    All other components within normal limits  CBC     EKG     RADIOLOGY    PROCEDURES:  Critical Care performed: No  Procedures   MEDICATIONS ORDERED IN ED: Medications - No data to display   IMPRESSION / MDM / ASSESSMENT AND PLAN / ED COURSE  I reviewed the triage vital signs and the nursing notes.  Patient's presentation is most consistent with acute, uncomplicated illness.  Differential diagnosis includes, but is not limited to, muscle strain, inguinal hernia, lymphadenopathy, DVT, aneurysm  Patient is a 51 year old male with significant cardiac history presents with left inguinal pain over the last 2 days.  Pain is worse with movement and going from lying to sitting.  Denies significant testicular pain or abdominal pain.  On exam there is no obvious masses hernias or skin changes.  Testicular exam is normal.  Abdominal exam is benign.  Given the nature of his symptoms specially pain worse with coughing my suspicion is that this is musculoskeletal.  Possible that there is an occult hernia that I am not appreciating but certainly no incarcerated or strangulated hernia currently.  Recommended Tylenol for pain control and return to ED for any worsening pain that does not resolve or any bulge that is reducible.  Patient also has been out of all of his medications since recent admission 2 months ago for cardiac arrest.  I have  sent his rosuvastatin or losartan and carvedilol to his pharmacy and recommended that he follow-up with both cardiology and primary care.       FINAL CLINICAL IMPRESSION(S) / ED DIAGNOSES   Final diagnoses:  Left inguinal pain     Rx / DC Orders   ED Discharge Orders          Ordered    albuterol (VENTOLIN HFA) 108 (90 Base) MCG/ACT inhaler  Every 6 hours PRN        01/13/22 1154    carvedilol (COREG) 6.25 MG tablet  2 times daily with meals        01/13/22 1154    losartan (COZAAR) 25 MG tablet  Daily        01/13/22 1154    rosuvastatin (CRESTOR) 10 MG tablet  Daily        01/13/22 1154             Note:  This document was prepared using Dragon voice recognition software and may include unintentional dictation errors.   Georga Hacking, MD 01/13/22 1159

## 2022-01-30 ENCOUNTER — Ambulatory Visit (INDEPENDENT_AMBULATORY_CARE_PROVIDER_SITE_OTHER): Payer: 59

## 2022-01-30 DIAGNOSIS — I4901 Ventricular fibrillation: Secondary | ICD-10-CM

## 2022-01-30 DIAGNOSIS — I469 Cardiac arrest, cause unspecified: Secondary | ICD-10-CM | POA: Diagnosis not present

## 2022-01-30 LAB — CUP PACEART REMOTE DEVICE CHECK
Battery Remaining Longevity: 174 mo
Battery Remaining Percentage: 100 %
Brady Statistic RV Percent Paced: 0 %
Date Time Interrogation Session: 20230914024900
HighPow Impedance: 68 Ohm
Implantable Lead Implant Date: 20230614
Implantable Lead Location: 753860
Implantable Lead Model: 673
Implantable Lead Serial Number: 196637
Implantable Pulse Generator Implant Date: 20230614
Lead Channel Impedance Value: 476 Ohm
Lead Channel Setting Pacing Amplitude: 3.5 V
Lead Channel Setting Pacing Pulse Width: 0.4 ms
Lead Channel Setting Sensing Sensitivity: 0.6 mV
Pulse Gen Serial Number: 287113

## 2022-02-03 NOTE — Progress Notes (Unsigned)
Electrophysiology Office Follow up Visit Note:    Date:  02/05/2022   ID:  Blake Moore, DOB 1970/05/25, MRN [id number]  PCP:  Pcp, No  [facility] Cardiologist:  Lorine Bears, MD  [facility] Electrophysiologist:  Lanier Prude, MD    Interval History:    Blake Moore is a 51 y.o. male who presents for a follow up visit.   Initially seen by Dr Graciela Husbands in consult 10/28/2021 after [facility]. He had a full neuro recovery. MRI showed no LGE. Concern was for possible coronary vasospasm as the cause for the arrest given chest pain that preceded the event. He has a single chamber [employer] ICD in situ.   Remote interrogation after implant have shown 19 NSVT episodes with 1 recorded >1 min. All of the NSVT episodes have ventricular rates 170-175 bpm. No HV therapies have been delivered.   Today he tells me that his shoulder feels well.  He had some early postoperative pain in the left shoulder but this is subsided by this point.  He is back at work and feeling well.      No past medical history on file.  Past Surgical History:  Procedure Laterality Date   ICD IMPLANT N/A 10/30/2021   Procedure: ICD IMPLANT;  Surgeon: Lanier Prude, MD;  Location: [facility] INVASIVE CV LAB;  Service: Cardiovascular;  Laterality: N/A;   LEFT HEART CATH AND CORONARY ANGIOGRAPHY N/A 10/28/2021   Procedure: LEFT HEART CATH AND CORONARY ANGIOGRAPHY;  Surgeon: Iran Ouch, MD;  Location: [facility] INVASIVE CV LAB;  Service: Cardiovascular;  Laterality: N/A;    Current Medications: Current Meds  Medication Sig   carvedilol (COREG) 6.25 MG tablet Take 1 tablet (6.25 mg total) by mouth 2 (two) times daily with a meal.   losartan (COZAAR) 25 MG tablet Take 0.5 tablets (12.5 mg total) by mouth daily.   rosuvastatin (CRESTOR) 10 MG tablet Take 1 tablet (10 mg total) by mouth daily.     Allergies:   Patient has no known allergies.   Social History   Socioeconomic History   Marital status: Single    Spouse  name: Not on file   Number of children: Not on file   Years of education: Not on file   Highest education level: Not on file  Occupational History   Not on file  Tobacco Use   Smoking status: Former    Types: Cigarettes   Smokeless tobacco: Never  Substance and Sexual Activity   Alcohol use: Yes    Alcohol/week: 4.0 standard drinks of alcohol    Types: 4 Cans of beer per week    Comment: everyday, couple of beers   Drug use: Not Currently    Types: Marijuana   Sexual activity: Not on file  Other Topics Concern   Not on file  Social History Narrative   Not on file   Social Determinants of Health   Financial Resource Strain: Not on file  Food Insecurity: Not on file  Transportation Needs: Not on file  Physical Activity: Not on file  Stress: Not on file  Social Connections: Not on file     Family History: The patient's family history is not on file.  ROS:   Please see the history of present illness.    All other systems reviewed and are negative.  EKGs/Labs/Other Studies Reviewed:    The following studies were reviewed today:  02/05/2022 in clinic device interrogation personally reviewed. Battery longevity 15 years Lead parameters stable Programmed VVI  40 Several nonsustained VT episodes reviewed and appear to be SVT with rates that cross into the VT monitor zone. Reprogrammed monitor zone to 180 bpm to exclude what appears to be sinus tach    Recent Labs: 10/25/2021: B Natriuretic Peptide 25.5; TSH 0.134 10/31/2021: Hemoglobin 12.8; Magnesium 1.9; Platelets 326 01/13/2022: ALT 45; BUN 12; Creatinine, Ser 0.62; Potassium 4.5; Sodium 138  Recent Lipid Panel    Component Value Date/Time   CHOL 124 10/25/2021 1115   TRIG 73 10/26/2021 0521   HDL 41 10/25/2021 1115   CHOLHDL 3.0 10/25/2021 1115   VLDL 8 10/25/2021 1115   LDLCALC 75 10/25/2021 1115    Physical Exam:    VS:  BP 122/68   Pulse 92   Ht 6' (1.829 m)   Wt 175 lb (79.4 kg)   BMI 23.73 kg/m      Wt Readings from Last 3 Encounters:  02/05/22 175 lb (79.4 kg)  01/13/22 175 lb (79.4 kg)  10/30/21 165 lb 2 oz (74.9 kg)     GEN:  Well nourished, well developed in no acute distress HEENT: Normal NECK: No JVD; No carotid bruits LYMPHATICS: No lymphadenopathy CARDIAC: RRR, no murmurs, rubs, gallops.  ICD pocket well-healed RESPIRATORY:  Clear to auscultation without rales, wheezing or rhonchi  ABDOMEN: Soft, non-tender, non-distended MUSCULOSKELETAL:  No edema; No deformity  SKIN: Warm and dry NEUROLOGIC:  Alert and oriented x 3 PSYCHIATRIC:  Normal affect        ASSESSMENT:    1. Cardiac arrest with ventricular fibrillation ([place])   2. Nonischemic cardiomyopathy (HCC)    PLAN:    In order of problems listed above:    #VF arrest #ICD in situ #HVR episodes  Doing well after ICD implant. No HV therapies delivered. HVR episodes appear to be sinus tachycardia crossing into the monitor zone  -Reprogrammed monitor zone to 180 bpm  #Chronic systolic heart failure #nonischemic cardiomyopathy NYHA II today. Mildly reduced EF on 10/2021 cMR. Continue coreg and losartan.   Follow-up 1 year or sooner as needed.  APP appointment.    Medication Adjustments/Labs and Tests Ordered: Current medicines are reviewed at length with the patient today.  Concerns regarding medicines are outlined above.  No orders of the defined types were placed in this encounter.  No orders of the defined types were placed in this encounter.    Signed, Lars Mage, MD, [facility], [facility] 02/05/2022 1:16 PM    Electrophysiology [facility]

## 2022-02-05 ENCOUNTER — Ambulatory Visit: Payer: 59 | Attending: Cardiology | Admitting: Cardiology

## 2022-02-05 ENCOUNTER — Encounter: Payer: Self-pay | Admitting: Cardiology

## 2022-02-05 VITALS — BP 122/68 | HR 92 | Ht 72.0 in | Wt 175.0 lb

## 2022-02-05 DIAGNOSIS — I4901 Ventricular fibrillation: Secondary | ICD-10-CM | POA: Diagnosis not present

## 2022-02-05 DIAGNOSIS — I469 Cardiac arrest, cause unspecified: Secondary | ICD-10-CM | POA: Diagnosis not present

## 2022-02-05 DIAGNOSIS — I428 Other cardiomyopathies: Secondary | ICD-10-CM | POA: Diagnosis not present

## 2022-02-05 NOTE — Patient Instructions (Signed)
Medication Instructions:  none *If you need a refill on your cardiac medications before your next appointment, please call your pharmacy*   Lab Work: none If you have labs (blood work) drawn today and your tests are completely normal, you will receive your results only by: MyChart Message (if you have MyChart) OR A paper copy in the mail If you have any lab test that is abnormal or we need to change your treatment, we will call you to review the results.   Testing/Procedures: none   Follow-Up: At [facility], you and your health needs are our priority.  As part of our continuing mission to provide you with exceptional heart care, we have created designated Provider Care Teams.  These Care Teams include your primary Cardiologist (physician) and Advanced Practice Providers (APPs -  Physician Assistants and Nurse Practitioners) who all work together to provide you with the care you need, when you need it.  We recommend signing up for the patient portal called "MyChart".  Sign up information is provided on this After Visit Summary.  MyChart is used to connect with patients for Virtual Visits (Telemedicine).  Patients are able to view lab/test results, encounter notes, upcoming appointments, etc.  Non-urgent messages can be sent to your provider as well.   To learn more about what you can do with MyChart, go to [email or web address]://[email or web address].com.    Your next appointment:   1 year(s)  The format for your next appointment:   In Person  Provider:   You will see one of the following Advanced Practice Providers on your designated Care Team:   Christopher Berge, NP Ryan Dunn, PA-C Cadence Furth, PA-C Sheri Hammock, NP      Other Instructions none  Important Information About Sugar       

## 2022-02-12 NOTE — Progress Notes (Signed)
Remote ICD transmission.   

## 2022-05-01 ENCOUNTER — Ambulatory Visit (INDEPENDENT_AMBULATORY_CARE_PROVIDER_SITE_OTHER): Payer: 59

## 2022-05-01 DIAGNOSIS — I428 Other cardiomyopathies: Secondary | ICD-10-CM

## 2022-05-02 LAB — CUP PACEART REMOTE DEVICE CHECK
Battery Remaining Longevity: 174 mo
Battery Remaining Percentage: 100 %
Brady Statistic RV Percent Paced: 0 %
Date Time Interrogation Session: 20231214022100
HighPow Impedance: 63 Ohm
Implantable Lead Connection Status: 753985
Implantable Lead Implant Date: 20230614
Implantable Lead Location: 753860
Implantable Lead Model: 673
Implantable Lead Serial Number: 196637
Implantable Pulse Generator Implant Date: 20230614
Lead Channel Impedance Value: 449 Ohm
Lead Channel Setting Pacing Amplitude: 2.5 V
Lead Channel Setting Pacing Pulse Width: 0.4 ms
Lead Channel Setting Sensing Sensitivity: 0.5 mV
Pulse Gen Serial Number: 287113
Zone Setting Status: 755011

## 2022-05-22 NOTE — Progress Notes (Signed)
Remote ICD transmission.   

## 2022-07-01 ENCOUNTER — Telehealth: Payer: Self-pay | Admitting: Cardiology

## 2022-07-01 NOTE — Telephone Encounter (Signed)
Spoke with the patient and advised on taking over the counter medications for congestion. Encouraged patient to reach out to his PCP as he states that he has been having trouble with his sinuses for a while now.

## 2022-07-01 NOTE — Telephone Encounter (Signed)
Patient states he has congestion and would like to know what he can take for it since he has a pacemaker.

## 2022-07-06 ENCOUNTER — Encounter: Payer: Self-pay | Admitting: Emergency Medicine

## 2022-07-06 ENCOUNTER — Other Ambulatory Visit: Payer: Self-pay

## 2022-07-06 ENCOUNTER — Emergency Department
Admission: EM | Admit: 2022-07-06 | Discharge: 2022-07-06 | Disposition: A | Discharge status: Home / Self Care | Payer: 59 | Attending: Emergency Medicine | Admitting: Emergency Medicine

## 2022-07-06 DIAGNOSIS — Z5321 Procedure and treatment not carried out due to patient leaving prior to being seen by health care provider: Secondary | ICD-10-CM | POA: Insufficient documentation

## 2022-07-06 DIAGNOSIS — K0889 Other specified disorders of teeth and supporting structures: Secondary | ICD-10-CM | POA: Insufficient documentation

## 2022-07-06 NOTE — ED Triage Notes (Signed)
Pt seen a dentist and given abx. States the pain has not stopped. Pain to right teeth, upper and lower.

## 2022-07-06 NOTE — ED Triage Notes (Signed)
Pt called multiple times to be roomed but no response. Pt no visualized in the lobby.

## 2022-07-10 ENCOUNTER — Telehealth: Payer: Self-pay | Admitting: *Deleted

## 2022-07-10 NOTE — Progress Notes (Signed)
  Care Coordination  Note  07/10/2022 Name: Blake Moore MRN: [id number] DOB: 08-27-1970  Blake Moore is a 52 y.o. year old primary care patient of Pcp, No.   Follow up plan: Hospital Follow Up/New patient appointment scheduled with (Dr Rosana Berger) on (07/15/2022) at (1pm).  Julian Hy, [place] Direct Dial: [phone]

## 2022-07-10 NOTE — Transitions of Care (Post Inpatient/ED Visit) (Signed)
   07/10/2022  Name: Blake Moore MRN: [id number] DOB: 16-Aug-1970  Today's TOC FU Call Status: Today's TOC FU Call Status:: Successful TOC FU Call Competed TOC FU Call Complete Date: 07/10/22  Transition Care Management Follow-up Telephone Call Date of Discharge: 07/10/22 Discharge Facility: [facility]) Type of Discharge: Emergency Department Reason for ED Visit: Other: (Dental Pain) How have you been since you were released from the hospital?: Better Any questions or concerns?: No  Items Reviewed: Did you receive and understand the discharge instructions provided?: No (patient left) Medications obtained and verified?: No Medications Not Reviewed Reasons:: Other: Any new allergies since your discharge?: No Dietary orders reviewed?: NA Do you have support at home?: Yes People in Home: significant other Name of Support/Comfort Primary Source: [place] and Equipment/Supplies: [place] Ordered?: No Any new equipment or medical supplies ordered?: No  Functional Questionnaire: Do you need assistance with bathing/showering or dressing?: No Do you need assistance with meal preparation?: No Do you need assistance with eating?: No Do you have difficulty maintaining continence: No Do you need assistance with getting out of bed/getting out of a chair/moving?: No Do you have difficulty managing or taking your medications?: No  Folllow up appointments reviewed: PCP Follow-up appointment confirmed?: Yes Date of PCP follow-up appointment?: 07/15/22 Follow-up Provider: Wells Guiles 1:00 [facility] Follow-up appointment confirmed?: NA Do you need transportation to your follow-up appointment?: No Do you understand care options if your condition(s) worsen?: Yes-patient verbalized understanding   New patient f/u appointment made with a PCP  [facility] [phone]

## 2022-07-15 ENCOUNTER — Ambulatory Visit
Admission: RE | Admit: 2022-07-15 | Discharge: 2022-07-15 | Disposition: A | Discharge status: Home / Self Care | Payer: 59 | Source: Ambulatory Visit | Attending: Internal Medicine | Admitting: Internal Medicine

## 2022-07-15 ENCOUNTER — Ambulatory Visit
Admission: RE | Admit: 2022-07-15 | Discharge: 2022-07-15 | Disposition: A | Discharge status: Home / Self Care | Payer: 59 | Attending: Internal Medicine | Admitting: Internal Medicine

## 2022-07-15 ENCOUNTER — Encounter: Payer: Self-pay | Admitting: Internal Medicine

## 2022-07-15 ENCOUNTER — Ambulatory Visit: Payer: 59 | Admitting: Internal Medicine

## 2022-07-15 VITALS — BP 124/78 | HR 113 | Temp 98.4°F | Resp 18 | Ht 73.0 in | Wt 168.7 lb

## 2022-07-15 DIAGNOSIS — Z1159 Encounter for screening for other viral diseases: Secondary | ICD-10-CM | POA: Diagnosis not present

## 2022-07-15 DIAGNOSIS — I252 Old myocardial infarction: Secondary | ICD-10-CM

## 2022-07-15 DIAGNOSIS — M7989 Other specified soft tissue disorders: Secondary | ICD-10-CM | POA: Diagnosis not present

## 2022-07-15 DIAGNOSIS — M25461 Effusion, right knee: Secondary | ICD-10-CM | POA: Diagnosis not present

## 2022-07-15 DIAGNOSIS — Z1211 Encounter for screening for malignant neoplasm of colon: Secondary | ICD-10-CM

## 2022-07-15 DIAGNOSIS — R0981 Nasal congestion: Secondary | ICD-10-CM

## 2022-07-15 DIAGNOSIS — M25561 Pain in right knee: Secondary | ICD-10-CM | POA: Insufficient documentation

## 2022-07-15 DIAGNOSIS — Z13220 Encounter for screening for lipoid disorders: Secondary | ICD-10-CM

## 2022-07-15 DIAGNOSIS — I428 Other cardiomyopathies: Secondary | ICD-10-CM | POA: Diagnosis not present

## 2022-07-15 MED ORDER — FLUTICASONE PROPIONATE 50 MCG/ACT NA SUSP: 2.0000 | Freq: Every day | NASAL | 6 refills | Status: DC

## 2022-07-15 MED ORDER — CARVEDILOL 6.25 MG PO TABS: 6.2500 mg | ORAL_TABLET | Freq: Two times a day (BID) | ORAL | 0 refills | Status: DC

## 2022-07-15 MED ORDER — ROSUVASTATIN CALCIUM 10 MG PO TABS: 10.0000 mg | ORAL_TABLET | Freq: Every day | ORAL | 0 refills | Status: DC

## 2022-07-15 MED ORDER — LOSARTAN POTASSIUM 25 MG PO TABS: 12.5000 mg | ORAL_TABLET | Freq: Every day | ORAL | 0 refills | Status: DC

## 2022-07-15 NOTE — Progress Notes (Signed)
New Patient Office Visit  Subjective    Patient ID: Blake Moore, male    DOB: Apr 14, 1971  Age: 52 y.o. MRN: [id number]  CC:  Chief Complaint  Patient presents with   Establish Care    Hasn't seen cardio in a year   Nasal Congestion    On going using nasal spray not helping    HPI Blake Moore presents to establish care.  History of cardiac arrest with ventricular fibrillation/NICM/CHF: -Has ICD implant since 6/23 -Cardiac MRI 6/23 EF 50% with global hypokinesis -Had been on Coreg and Losartan but ran out of medications and not on currently. Tolerated medications well when he was taking them. -Has also been on Crestor in the past -Following with Cardiology, last seen 01/30/22  Recurrent Nasal Congestion: -Has been going on for weeks -Using Afrin nasal spray -No other URI symptoms, no sinus pain/pressure, rhinorrhea, sore throat, fevers  Right Knee Pain: -Pain and swelling on the posterior aspect of his right knee -Worse with increased activity/walking -Does not radiate -No instability, locking or popping  Health Maintenance: -Blood work due -Colon cancer screening due   Outpatient Encounter Medications as of 07/15/2022  Medication Sig   albuterol (VENTOLIN HFA) 108 (90 Base) MCG/ACT inhaler Inhale 2 puffs into the lungs every 6 (six) hours as needed for wheezing or shortness of breath. (Patient not taking: Reported on 02/05/2022)   [DISCONTINUED] carvedilol (COREG) 6.25 MG tablet Take 1 tablet (6.25 mg total) by mouth 2 (two) times daily with a meal.   [DISCONTINUED] losartan (COZAAR) 25 MG tablet Take 0.5 tablets (12.5 mg total) by mouth daily.   [DISCONTINUED] rosuvastatin (CRESTOR) 10 MG tablet Take 1 tablet (10 mg total) by mouth daily.   No facility-administered encounter medications on file as of 07/15/2022.    Past Medical History:  Diagnosis Date   Heart attack [facility])     Past Surgical History:  Procedure Laterality Date   ICD IMPLANT N/A 10/30/2021    Procedure: ICD IMPLANT;  Surgeon: Vickie Epley, MD;  Location: [place] CV LAB;  Service: Cardiovascular;  Laterality: N/A;   LEFT HEART CATH AND CORONARY ANGIOGRAPHY N/A 10/28/2021   Procedure: LEFT HEART CATH AND CORONARY ANGIOGRAPHY;  Surgeon: Wellington Hampshire, MD;  Location: [place] CV LAB;  Service: Cardiovascular;  Laterality: N/A;   PACEMAKER IMPLANT      Family History  Problem Relation Age of Onset   Diabetes Mother    Hypertension Mother    Cancer Father     Social History   Socioeconomic History   Marital status: Single    Spouse name: Not on file   Number of children: Not on file   Years of education: Not on file   Highest education level: Not on file  Occupational History   Not on file  Tobacco Use   Smoking status: Former    Types: Cigarettes   Smokeless tobacco: Never  Vaping Use   Vaping Use: Never used  Substance and Sexual Activity   Alcohol use: Not Currently    Comment: occ   Drug use: Yes    Types: Marijuana   Sexual activity: Yes  Other Topics Concern   Not on file  Social History Narrative   Not on file   Social Determinants of Health   Financial Resource Strain: Not on file  Food Insecurity: Not on file  Transportation Needs: Not on file  Physical Activity: Not on file  Stress: Not on file  Social Connections: Not  on file  Intimate Partner Violence: Not on file    Review of Systems  Constitutional:  Negative for chills and fever.  HENT:  Positive for congestion. Negative for ear pain, sinus pain and sore throat.   Respiratory:  Negative for shortness of breath.   Cardiovascular:  Negative for chest pain and palpitations.  Gastrointestinal:  Negative for abdominal pain.  Musculoskeletal:  Positive for joint pain.        Objective    BP 124/78   Pulse (!) 113   Temp 98.4 F (36.9 C)   Resp 18   Ht '6\' 1"'$  (1.854 m)   Wt 168 lb 11.2 oz (76.5 kg)   SpO2 98%   BMI 22.26 kg/m   Physical Exam Constitutional:       Appearance: Normal appearance.  HENT:     Head: Normocephalic and atraumatic.     Right Ear: Tympanic membrane, ear canal and external ear normal.     Left Ear: Tympanic membrane, ear canal and external ear normal.     Nose: Congestion present.     Mouth/Throat:     Mouth: Mucous membranes are moist.     Pharynx: Posterior oropharyngeal erythema present.  Eyes:     Conjunctiva/sclera: Conjunctivae normal.  Cardiovascular:     Rate and Rhythm: Normal rate and regular rhythm.  Pulmonary:     Effort: Pulmonary effort is normal.     Breath sounds: Normal breath sounds.  Musculoskeletal:     Right knee: Swelling present. No bony tenderness or crepitus. Normal range of motion. Tenderness present.     Right lower leg: No edema.     Left lower leg: No edema.     Comments: Posterior swelling/pain, possible Baker's cyst  Skin:    General: Skin is warm and dry.  Neurological:     General: No focal deficit present.     Mental Status: He is alert. Mental status is at baseline.  Psychiatric:        Mood and Affect: Mood normal.        Behavior: Behavior normal.         Assessment & Plan:   1. Nonischemic cardiomyopathy (HCC)/History of MI (myocardial infarction): Reviewed last Cardiology note from 01/30/22. Needs to be restarted on his medications, BP stable but discussed why low doses of medications are important to preserve cardiac function. Restart Coreg 6.25 mg BID and Losartan 12.5 mg daily. Will also restart Crestor 10 mg daily. Labs due today. Follow up in 1 month to see how he is doing with the medications.  - CBC w/Diff/Platelet - COMPLETE METABOLIC PANEL WITH GFR - Magnesium - carvedilol (COREG) 6.25 MG tablet; Take 1 tablet (6.25 mg total) by mouth 2 (two) times daily with a meal.  Dispense: 180 tablet; Refill: 0 - losartan (COZAAR) 25 MG tablet; Take 0.5 tablets (12.5 mg total) by mouth daily.  Dispense: 45 tablet; Refill: 0 - rosuvastatin (CRESTOR) 10 MG tablet; Take 1  tablet (10 mg total) by mouth daily.  Dispense: 90 tablet; Refill: 0  2. Lipid screening: Obtain fasting lipid panel, restart Crestor 10 mg today.   - Lipid Profile  3. Need for hepatitis C screening test: Screening due.   - Hepatitis C Antibody  4. Colon cancer screening: Referral for colonoscopy placed.   - Ambulatory referral to Gastroenterology  5. Pain and swelling of knee, right: Consistent with Baker's cyst but also a history of medial ligament injury, will send for x-ray today.   -  DG Knee Complete 4 Views Right; Future  6. Nasal congestion: Rebound congestion due to overuse of nasal vasoconstrictor, stop Afrin and start Flonase.   - fluticasone (FLONASE) 50 MCG/ACT nasal spray; Place 2 sprays into both nostrils daily.  Dispense: 16 g; Refill: 6   Return in about 4 weeks (around 08/12/2022).   Teodora Medici, DO

## 2022-07-15 NOTE — Patient Instructions (Addendum)
It was great seeing you today!  Plan discussed at today's visit: -Blood work ordered today, results will be uploaded to [facility]. Please return fasting for 8-12 hours. Lab hours are 8:30-11 and 2-4. Can come by any weekday during these hours. -Medications restarted -Nasal spray sent to pharmacy do NOT use the first spray you brought I'm -Referral to GI for colon cancer screening placed -X-ray today  Follow up in: 1 month   Take care and let us know if you have any questions or concerns prior to your next visit.  Dr. Rosana Berger

## 2022-07-16 ENCOUNTER — Telehealth: Payer: Self-pay

## 2022-07-16 DIAGNOSIS — Z1211 Encounter for screening for malignant neoplasm of colon: Secondary | ICD-10-CM

## 2022-07-16 NOTE — Telephone Encounter (Signed)
Opened In Error.  Sharyn Lull, CMA

## 2022-07-16 NOTE — Telephone Encounter (Signed)
Contacted patient to schedule his colonoscopy.  He is concerned about the cost of having his colonoscopy as his insurance from [place] did not cover aspects of his recent health services here in [place]. He just started a new job and is planning on changing his insurance to his employer insurance.  I informed him that I will send a reminder letter to him and once he gets his new insurance he can call us back to schedule.  Thanks, [place], [place]

## 2022-07-22 ENCOUNTER — Telehealth: Payer: Self-pay | Admitting: Cardiology

## 2022-07-22 NOTE — Telephone Encounter (Signed)
I spoke with the patient and let him know our recommendations are if you are gone 7 or more days to take the monitor with him. He can choose to leave it at home but if something happens we will not know. Pt verbalized understanding.

## 2022-07-22 NOTE — Telephone Encounter (Signed)
  1. Has your device fired? no  2. Is you device beeping? no  3. Are you experiencing draining or swelling at device site? no  4. Are you calling to see if we received your device transmission? no  5. Have you passed out? No   patient is going out of town for 5 days this month, he wants to know what to do about his device    Please route to [employer]

## 2022-07-31 ENCOUNTER — Ambulatory Visit: Payer: 59

## 2022-07-31 DIAGNOSIS — I428 Other cardiomyopathies: Secondary | ICD-10-CM | POA: Diagnosis not present

## 2022-08-01 LAB — CUP PACEART REMOTE DEVICE CHECK
Battery Remaining Longevity: 168 mo
Battery Remaining Percentage: 100 %
Brady Statistic RV Percent Paced: 0 %
Date Time Interrogation Session: 20240314022100
HighPow Impedance: 76 Ohm
Implantable Lead Connection Status: 753985
Implantable Lead Implant Date: 20230614
Implantable Lead Location: 753860
Implantable Lead Model: 673
Implantable Lead Serial Number: 196637
Implantable Pulse Generator Implant Date: 20230614
Lead Channel Impedance Value: 469 Ohm
Lead Channel Setting Pacing Amplitude: 2.5 V
Lead Channel Setting Pacing Pulse Width: 0.4 ms
Lead Channel Setting Sensing Sensitivity: 0.5 mV
Pulse Gen Serial Number: 287113
Zone Setting Status: 755011

## 2022-08-12 ENCOUNTER — Ambulatory Visit: Payer: 59 | Admitting: Internal Medicine

## 2022-08-13 ENCOUNTER — Other Ambulatory Visit: Payer: Self-pay | Admitting: Internal Medicine

## 2022-08-13 DIAGNOSIS — I252 Old myocardial infarction: Secondary | ICD-10-CM

## 2022-08-13 DIAGNOSIS — I428 Other cardiomyopathies: Secondary | ICD-10-CM

## 2022-08-13 NOTE — Addendum Note (Signed)
Addended by: Valli Glance F on: 08/13/2022 04:06 PM   Modules accepted: Orders

## 2022-08-13 NOTE — Telephone Encounter (Signed)
Pt is calling back regarding his medication. Per pt the pharmacy gave him a 3 month supply of carvedilol (COREG) 6.25 MG tablet .Marland Kitchen  Pt states that he is needing his losartan (COZAAR) 25 MG tablet  And rosuvastatin (CRESTOR) 10 MG tablet  refilled,  Per pt he only received 15 pills of the Losartan where he cuts the pill in half and only take 1/2 of the pill a day and he only has one pill left.  Pt received 30 pills of the Rosuvastatin and he only has 2 pills left.  Please advise.

## 2022-08-13 NOTE — Telephone Encounter (Signed)
Routing to Nurse Triage.

## 2022-08-13 NOTE — Telephone Encounter (Signed)
[place] called and spoke to [employer], Merchant navy officer about the refill requested. She says Carvedilol and Rosuvastatin was picked up on 07/15/22 3 month supply and the only medication returned to stock was flonase. Patient called, left VM to return the call to the office. When he returns the call notify him of the above. If he didn't pick them up, someone else picked them up for him and it's too early to refill again.

## 2022-08-13 NOTE — Telephone Encounter (Signed)
Pt returned the missed call and stated he would call back when he had the medications in front of him.  Please advise.

## 2022-08-13 NOTE — Telephone Encounter (Signed)
Medication Refill - Medication: rosuvastatin (CRESTOR) 10 MG tablet, carvedilol (COREG) 6.25 MG tablet   Pt stated he just got back in town yesterday. He had let the pharmacy know that he was out of town, and he would pick up the medication when he returned. I asked if he had reached out to the pharmacy, and pt explained that he had not; he requested refill and we denied it. I tried to explain to pt that it was due to him requesting a refill too soon. I asked if he had medication left, and he said no, he is out of the pill that looks like a football and the pink one. Stated this is his first time taking his medication, and if we don't give it to him, he just won't take it. He is just concerned because Dr.Andrews told him he needed to take his medication.     Please advise.   Has the patient contacted their pharmacy? No. (Agent: If no, request that the patient contact the pharmacy for the refill. If patient does not wish to contact the pharmacy document the reason why and proceed with request.)   Preferred Pharmacy (with phone number or street name):  [place], [place] - [address]  [address] [place] [address]  Phone: [phone] Fax: [phone]  Hours: Not open 24 hours   Has the patient been seen for an appointment in the last year OR does the patient have an upcoming appointment? Yes.    Agent: Please be advised that RX refills may take up to 3 business days. We ask that you follow-up with your pharmacy.

## 2022-08-13 NOTE — Telephone Encounter (Signed)
Patient called, left VM to return the call to the office. Will need to know the date on the bottle of 30 pills of Rosuvastatin received from the pharmacy, because as stated in the below call to the pharmacy, he picked up a 90 day supply on 07/15/22.

## 2022-08-13 NOTE — Telephone Encounter (Unsigned)
Copied from [place] [id number]. Topic: General - Other >> Aug 13, 2022  3:04 PM Everette C wrote: Reason for CRM: Medication Refill - Medication: losartan (COZAAR) 25 MG tablet [id number]  rosuvastatin (CRESTOR) 10 MG tablet [id number]  Has the patient contacted their pharmacy? Yes.   (Agent: If no, request that the patient contact the pharmacy for the refill. If patient does not wish to contact the pharmacy document the reason why and proceed with request.) (Agent: If yes, when and what did the pharmacy advise?)  Preferred Pharmacy (with phone number or street name): [place], [place] - [address] [place] [address] Phone: [phone] Fax: [phone] Hours: Not open 24 hours   Has the patient been seen for an appointment in the last year OR does the patient have an upcoming appointment? Yes.    Agent: Please be advised that RX refills may take up to 3 business days. We ask that you follow-up with your pharmacy.

## 2022-08-14 NOTE — Telephone Encounter (Signed)
Duplicate encounter, this has been addressed in a separate encounter.

## 2022-08-14 NOTE — Telephone Encounter (Signed)
Requested medication (s) are due for refill today: routing for review  Requested medication (s) are on the active medication list: yes  Last refill:  07/15/22  Future visit scheduled: yes  Notes to clinic Routing for approval, recently denied. Patient requesting refill.     Requested Prescriptions  Pending Prescriptions Disp Refills   losartan (COZAAR) 25 MG tablet 45 tablet 0    Sig: Take 0.5 tablets (12.5 mg total) by mouth daily.     Cardiovascular:  Angiotensin Receptor Blockers Failed - 08/13/2022  4:15 PM      Failed - Cr in normal range and within 180 days    Creatinine, Ser  Date Value Ref Range Status  01/13/2022 0.62 0.61 - 1.24 mg/dL Final         Failed - K in normal range and within 180 days    Potassium  Date Value Ref Range Status  01/13/2022 4.5 3.5 - 5.1 mmol/L Final         Passed - Patient is not pregnant      Passed - Last BP in normal range    BP Readings from Last 1 Encounters:  07/15/22 124/78         Passed - Valid encounter within last 6 months    Recent Outpatient Visits           1 month ago Nonischemic cardiomyopathy [facility])   [facility] Blake Medici, DO       Future Appointments             In 4 days Blake Moore, [facility], [facility]             rosuvastatin (CRESTOR) 10 MG tablet 90 tablet 0    Sig: Take 1 tablet (10 mg total) by mouth daily.     Cardiovascular:  Antilipid - Statins 2 Failed - 08/13/2022  4:15 PM      Failed - Lipid Panel in normal range within the last 12 months    Cholesterol  Date Value Ref Range Status  10/25/2021 124 0 - 200 mg/dL Final   LDL Cholesterol  Date Value Ref Range Status  10/25/2021 75 0 - 99 mg/dL Final    Comment:           Total Cholesterol/HDL:CHD Risk Coronary Heart Disease Risk Table                     Men   Women  1/2 Average Risk   3.4   3.3  Average Risk       5.0   4.4  2 X Average Risk   9.6   7.1  3 X  Average Risk  23.4   11.0        Use the calculated Patient Ratio above and the CHD Risk Table to determine the patient's CHD Risk.        ATP III CLASSIFICATION (LDL):  <100     mg/dL   Optimal  100-129  mg/dL   Near or Above                    Optimal  130-159  mg/dL   Borderline  160-189  mg/dL   High  >190     mg/dL   Very High Performed at [facility], [place], [place] [address]    HDL  Date Value Ref Range Status  10/25/2021 41 >40 mg/dL  Final   Triglycerides  Date Value Ref Range Status  10/26/2021 73 <150 mg/dL Final    Comment:    Performed at [facility], [address] [place], [place] [address]         Passed - Cr in normal range and within 360 days    Creatinine, Ser  Date Value Ref Range Status  01/13/2022 0.62 0.61 - 1.24 mg/dL Final         Passed - Patient is not pregnant      Passed - Valid encounter within last 12 months    Recent Outpatient Visits           1 month ago Nonischemic cardiomyopathy [facility])   [facility] Blake Medici, DO       Future Appointments             In 4 days Blake Moore, [facility], [facility]

## 2022-08-14 NOTE — Telephone Encounter (Signed)
Requested Prescriptions  Pending Prescriptions Disp Refills   losartan (COZAAR) 25 MG tablet 45 tablet 0    Sig: Take 0.5 tablets (12.5 mg total) by mouth daily.     Cardiovascular:  Angiotensin Receptor Blockers Failed - 08/13/2022  4:15 PM      Failed - Cr in normal range and within 180 days    Creatinine, Ser  Date Value Ref Range Status  01/13/2022 0.62 0.61 - 1.24 mg/dL Final         Failed - K in normal range and within 180 days    Potassium  Date Value Ref Range Status  01/13/2022 4.5 3.5 - 5.1 mmol/L Final         Passed - Patient is not pregnant      Passed - Last BP in normal range    BP Readings from Last 1 Encounters:  07/15/22 124/78         Passed - Valid encounter within last 6 months    Recent Outpatient Visits           1 month ago Nonischemic cardiomyopathy [facility])   [facility] Teodora Medici, DO       Future Appointments             In 4 days Teodora Medici, [facility], [facility]             rosuvastatin (CRESTOR) 10 MG tablet 90 tablet 0    Sig: Take 1 tablet (10 mg total) by mouth daily.     Cardiovascular:  Antilipid - Statins 2 Failed - 08/13/2022  4:15 PM      Failed - Lipid Panel in normal range within the last 12 months    Cholesterol  Date Value Ref Range Status  10/25/2021 124 0 - 200 mg/dL Final   LDL Cholesterol  Date Value Ref Range Status  10/25/2021 75 0 - 99 mg/dL Final    Comment:           Total Cholesterol/HDL:CHD Risk Coronary Heart Disease Risk Table                     Men   Women  1/2 Average Risk   3.4   3.3  Average Risk       5.0   4.4  2 X Average Risk   9.6   7.1  3 X Average Risk  23.4   11.0        Use the calculated Patient Ratio above and the CHD Risk Table to determine the patient's CHD Risk.        ATP III CLASSIFICATION (LDL):  <100     mg/dL   Optimal  100-129  mg/dL   Near or Above                    Optimal  130-159  mg/dL    Borderline  160-189  mg/dL   High  >190     mg/dL   Very High Performed at [facility], [place], [place] [address]    HDL  Date Value Ref Range Status  10/25/2021 41 >40 mg/dL Final   Triglycerides  Date Value Ref Range Status  10/26/2021 73 <150 mg/dL Final    Comment:    Performed at [facility], [address] [place], [place] [address]         Passed - Cr in normal  range and within 360 days    Creatinine, Ser  Date Value Ref Range Status  01/13/2022 0.62 0.61 - 1.24 mg/dL Final         Passed - Patient is not pregnant      Passed - Valid encounter within last 12 months    Recent Outpatient Visits           1 month ago Nonischemic cardiomyopathy [facility])   [facility] Teodora Medici, DO       Future Appointments             In 4 days Teodora Medici, [facility], [facility]

## 2022-08-14 NOTE — Telephone Encounter (Signed)
Requested medication (s) are due for refill today: routing for review  Requested medication (s) are on the active medication list: yes  Last refill:  07/15/22  Future visit scheduled: yes  Notes to clinic:  Patient requesting refill, medication was denied for being too soon. See previous note, routing for approval.     Requested Prescriptions  Pending Prescriptions Disp Refills   losartan (COZAAR) 25 MG tablet 45 tablet 0    Sig: Take 0.5 tablets (12.5 mg total) by mouth daily.     Cardiovascular:  Angiotensin Receptor Blockers Failed - 08/13/2022  4:15 PM      Failed - Cr in normal range and within 180 days    Creatinine, Ser  Date Value Ref Range Status  01/13/2022 0.62 0.61 - 1.24 mg/dL Final         Failed - K in normal range and within 180 days    Potassium  Date Value Ref Range Status  01/13/2022 4.5 3.5 - 5.1 mmol/L Final         Passed - Patient is not pregnant      Passed - Last BP in normal range    BP Readings from Last 1 Encounters:  07/15/22 124/78         Passed - Valid encounter within last 6 months    Recent Outpatient Visits           1 month ago Nonischemic cardiomyopathy [facility])   [facility] Teodora Medici, DO       Future Appointments             In 4 days Teodora Medici, [facility], [facility]             rosuvastatin (CRESTOR) 10 MG tablet 90 tablet 0    Sig: Take 1 tablet (10 mg total) by mouth daily.     Cardiovascular:  Antilipid - Statins 2 Failed - 08/13/2022  4:15 PM      Failed - Lipid Panel in normal range within the last 12 months    Cholesterol  Date Value Ref Range Status  10/25/2021 124 0 - 200 mg/dL Final   LDL Cholesterol  Date Value Ref Range Status  10/25/2021 75 0 - 99 mg/dL Final    Comment:           Total Cholesterol/HDL:CHD Risk Coronary Heart Disease Risk Table                     Men   Women  1/2 Average Risk   3.4   3.3  Average Risk        5.0   4.4  2 X Average Risk   9.6   7.1  3 X Average Risk  23.4   11.0        Use the calculated Patient Ratio above and the CHD Risk Table to determine the patient's CHD Risk.        ATP III CLASSIFICATION (LDL):  <100     mg/dL   Optimal  100-129  mg/dL   Near or Above                    Optimal  130-159  mg/dL   Borderline  160-189  mg/dL   High  >190     mg/dL   Very High Performed at [facility], [address], [place], [place] [address]    HDL  Date  Value Ref Range Status  10/25/2021 41 >40 mg/dL Final   Triglycerides  Date Value Ref Range Status  10/26/2021 73 <150 mg/dL Final    Comment:    Performed at [facility], [address] [place], [place] [address]         Passed - Cr in normal range and within 360 days    Creatinine, Ser  Date Value Ref Range Status  01/13/2022 0.62 0.61 - 1.24 mg/dL Final         Passed - Patient is not pregnant      Passed - Valid encounter within last 12 months    Recent Outpatient Visits           1 month ago Nonischemic cardiomyopathy [facility])   [facility] Teodora Medici, DO       Future Appointments             In 4 days Teodora Medici, [facility], [facility]

## 2022-08-17 NOTE — Progress Notes (Unsigned)
Established Patient Office Visit  Subjective    Patient ID: Blake Moore, male    DOB: [birth year]  Age: 52 y.o. MRN: [id number]  CC:  No chief complaint on file.   HPI Manas Mcgugan presents to follow up on chronic medical conditions.  History of cardiac arrest with ventricular fibrillation/NICM/CHF: -Has ICD implant since 6/23 -Cardiac MRI 6/23 EF 50% with global hypokinesis -Had been on Coreg and Losartan but ran out of medications and not on currently. Tolerated medications well when he was taking them. -Has also been on Crestor in the past -Following with Cardiology, last seen 01/30/22  Health Maintenance: -Blood work due -Colon cancer screening due   Outpatient Encounter Medications as of 08/18/2022  Medication Sig   albuterol (VENTOLIN HFA) 108 (90 Base) MCG/ACT inhaler Inhale 2 puffs into the lungs every 6 (six) hours as needed for wheezing or shortness of breath. (Patient not taking: Reported on 02/05/2022)   carvedilol (COREG) 6.25 MG tablet Take 1 tablet (6.25 mg total) by mouth 2 (two) times daily with a meal.   fluticasone (FLONASE) 50 MCG/ACT nasal spray Place 2 sprays into both nostrils daily.   losartan (COZAAR) 25 MG tablet Take 0.5 tablets (12.5 mg total) by mouth daily.   rosuvastatin (CRESTOR) 10 MG tablet Take 1 tablet (10 mg total) by mouth daily.   No facility-administered encounter medications on file as of 08/18/2022.    Past Medical History:  Diagnosis Date   Heart attack [facility])     Past Surgical History:  Procedure Laterality Date   ICD IMPLANT N/A 10/30/2021   Procedure: ICD IMPLANT;  Surgeon: Vickie Epley, MD;  Location: [place];  Service: Cardiovascular;  Laterality: N/A;   LEFT HEART CATH AND CORONARY ANGIOGRAPHY N/A 10/28/2021   Procedure: LEFT HEART CATH AND CORONARY ANGIOGRAPHY;  Surgeon: Wellington Hampshire, MD;  Location: [place] [facility];  Service: Cardiovascular;  Laterality: N/A;   PACEMAKER IMPLANT      Family  History  Problem Relation Age of Onset   Diabetes Mother    Hypertension Mother    Cancer Father     Social History   Socioeconomic History   Marital status: Single    Spouse name: Not on file   Number of children: Not on file   Years of education: Not on file   Highest education level: Not on file  Occupational History   Not on file  Tobacco Use   Smoking status: Former    Types: Cigarettes   Smokeless tobacco: Never  Vaping Use   Vaping Use: Never used  Substance and Sexual Activity   Alcohol use: Not Currently    Comment: occ   Drug use: Yes    Types: Marijuana   Sexual activity: Yes  Other Topics Concern   Not on file  Social History Narrative   Not on file   Social Determinants of Health   Financial Resource Strain: Not on file  Food Insecurity: Not on file  Transportation Needs: Not on file  Physical Activity: Not on file  Stress: Not on file  Social Connections: Not on file  Intimate Partner Violence: Not on file    Review of Systems  Constitutional:  Negative for chills and fever.  HENT:  Positive for congestion. Negative for ear pain, sinus pain and sore throat.   Respiratory:  Negative for shortness of breath.   Cardiovascular:  Negative for chest pain and palpitations.  Gastrointestinal:  Negative for abdominal pain.  Musculoskeletal:  Positive for joint pain.        Objective    There were no vitals taken for this visit.  Physical Exam Constitutional:      Appearance: Normal appearance.  HENT:     Head: Normocephalic and atraumatic.     Right Ear: Tympanic membrane, ear canal and external ear normal.     Left Ear: Tympanic membrane, ear canal and external ear normal.     Nose: Congestion present.     Mouth/Throat:     Mouth: Mucous membranes are moist.     Pharynx: Posterior oropharyngeal erythema present.  Eyes:     Conjunctiva/sclera: Conjunctivae normal.  Cardiovascular:     Rate and Rhythm: Normal rate and regular rhythm.   Pulmonary:     Effort: Pulmonary effort is normal.     Breath sounds: Normal breath sounds.  Musculoskeletal:     Right knee: Swelling present. No bony tenderness or crepitus. Normal range of motion. Tenderness present.     Right lower leg: No edema.     Left lower leg: No edema.     Comments: Posterior swelling/pain, possible Baker's cyst  Skin:    General: Skin is warm and dry.  Neurological:     General: No focal deficit present.     Mental Status: He is alert. Mental status is at baseline.  Psychiatric:        Mood and Affect: Mood normal.        Behavior: Behavior normal.         Assessment & Plan:   1. Nonischemic cardiomyopathy (HCC)/History of MI (myocardial infarction): Reviewed last Cardiology note from 01/30/22. Needs to be restarted on his medications, BP stable but discussed why low doses of medications are important to preserve cardiac function. Restart Coreg 6.25 mg BID and Losartan 12.5 mg daily. Will also restart Crestor 10 mg daily. Labs due today. Follow up in 1 month to see how he is doing with the medications.  - CBC w/Diff/Platelet - COMPLETE METABOLIC PANEL WITH GFR - Magnesium - carvedilol (COREG) 6.25 MG tablet; Take 1 tablet (6.25 mg total) by mouth 2 (two) times daily with a meal.  Dispense: 180 tablet; Refill: 0 - losartan (COZAAR) 25 MG tablet; Take 0.5 tablets (12.5 mg total) by mouth daily.  Dispense: 45 tablet; Refill: 0 - rosuvastatin (CRESTOR) 10 MG tablet; Take 1 tablet (10 mg total) by mouth daily.  Dispense: 90 tablet; Refill: 0  2. Lipid screening: Obtain fasting lipid panel, restart Crestor 10 mg today.   - Lipid Profile  3. Need for hepatitis C screening test: Screening due.   - Hepatitis C Antibody  4. Colon cancer screening: Referral for colonoscopy placed.   - Ambulatory referral to Gastroenterology  5. Pain and swelling of knee, right: Consistent with Baker's cyst but also a history of medial ligament injury, will send for  x-ray today.   - DG Knee Complete 4 Views Right; Future  6. Nasal congestion: Rebound congestion due to overuse of nasal vasoconstrictor, stop Afrin and start Flonase.   - fluticasone (FLONASE) 50 MCG/ACT nasal spray; Place 2 sprays into both nostrils daily.  Dispense: 16 g; Refill: 6   No follow-ups on file.   Teodora Medici, DO

## 2022-08-18 ENCOUNTER — Encounter: Payer: Self-pay | Admitting: Internal Medicine

## 2022-08-18 ENCOUNTER — Telehealth: Payer: Self-pay | Admitting: Internal Medicine

## 2022-08-18 ENCOUNTER — Ambulatory Visit (INDEPENDENT_AMBULATORY_CARE_PROVIDER_SITE_OTHER): Payer: 59 | Admitting: Internal Medicine

## 2022-08-18 VITALS — BP 116/64 | HR 91 | Temp 98.4°F | Resp 18 | Ht 73.0 in | Wt 168.5 lb

## 2022-08-18 DIAGNOSIS — I428 Other cardiomyopathies: Secondary | ICD-10-CM | POA: Diagnosis not present

## 2022-08-18 DIAGNOSIS — Z1159 Encounter for screening for other viral diseases: Secondary | ICD-10-CM | POA: Diagnosis not present

## 2022-08-18 DIAGNOSIS — Z13220 Encounter for screening for lipoid disorders: Secondary | ICD-10-CM

## 2022-08-18 DIAGNOSIS — I252 Old myocardial infarction: Secondary | ICD-10-CM

## 2022-08-18 NOTE — Telephone Encounter (Signed)
Pt is calling to report that the medication that was called in for him today - he does not know the name of it. Is $58 and he is unable to afford that. Please advise [phone]

## 2022-08-18 NOTE — Patient Instructions (Addendum)
It was great seeing you today!  Plan discussed at today's visit: -Blood work ordered today, results will be uploaded to [place].   Follow up in: 3 months   Take care and let us know if you have any questions or concerns prior to your next visit.  Dr. Rosana Berger  Lipoma  A lipoma is a noncancerous (benign) tumor that is made up of fat cells. This is a very common type of soft-tissue growth. Lipomas are usually found under the skin (subcutaneous). They may occur in any tissue of the body that contains fat. Common areas for lipomas to appear include the back, arms, shoulders, buttocks, and thighs. Lipomas grow slowly, and they are usually painless. Most lipomas do not cause problems and do not require treatment. What are the causes? The cause of this condition is not known. What increases the risk? You are more likely to develop this condition if: You are 72-46 years old. You have a family history of lipomas. What are the signs or symptoms? A lipoma usually appears as a small, round bump under the skin. In most cases, the lump will: Feel soft or rubbery. Not cause pain or other symptoms. However, if a lipoma is located in an area where it pushes on nerves, it can become painful or cause other symptoms. How is this diagnosed? A lipoma can usually be diagnosed with a physical exam. You may also have tests to confirm the diagnosis and to rule out other conditions. Tests may include: Imaging tests, such as a CT scan or an MRI. Removal of a tissue sample to be looked at under a microscope (biopsy). How is this treated? Treatment for this condition depends on the size of the lipoma and whether it is causing any symptoms. For small lipomas that are not causing problems, no treatment is needed. If a lipoma is bigger or it causes problems, surgery may be done to remove the lipoma. Lipomas can also be removed to improve appearance. Most often, the procedure is done after applying a medicine that  numbs the area (local anesthetic). Liposuction may be done to reduce the size of the lipoma before it is removed through surgery, or it may be done to remove the lipoma. Lipomas are removed with this method to limit incision size and scarring. A liposuction tube is inserted through a small incision into the lipoma, and the contents of the lipoma are removed through the tube with suction. Follow these instructions at home: Watch your lipoma for any changes. Keep all follow-up visits. This is important. Where to find more information OrthoInfo: orthoinfo.aaos.org Contact a health care provider if: Your lipoma becomes larger or hard. Your lipoma becomes painful, red, or increasingly swollen. These could be signs of infection or a more serious condition. Get help right away if: You develop tingling or numbness in an area near the lipoma. This could indicate that the lipoma is causing nerve damage. Summary A lipoma is a noncancerous tumor that is made up of fat cells. Most lipomas do not cause problems and do not require treatment. If a lipoma is bigger or it causes problems, surgery may be done to remove the lipoma. Contact a health care provider if your lipoma becomes larger or hard, or if it becomes painful, red, or increasingly swollen. These could be signs of infection or a more serious condition. This information is not intended to replace advice given to you by your health care provider. Make sure you discuss any questions you have with your  health care provider. Document Revised: 05/24/2021 Document Reviewed: 05/24/2021 Elsevier Patient Education  [place].

## 2022-08-19 LAB — CBC WITH DIFFERENTIAL/PLATELET
Absolute Monocytes: 594 cells/uL (ref 200–950)
Basophils Absolute: 11 cells/uL (ref 0–200)
Basophils Relative: 0.3 %
Eosinophils Absolute: 162 cells/uL (ref 15–500)
Eosinophils Relative: 4.5 %
HCT: 36.6 % — ABNORMAL LOW (ref 38.5–50.0)
Hemoglobin: 11.4 g/dL — ABNORMAL LOW (ref 13.2–17.1)
Lymphs Abs: 1497.6 cells/uL (ref 850–3900)
MCH: 24.2 pg — ABNORMAL LOW (ref 27.0–33.0)
MCHC: 31.1 g/dL — ABNORMAL LOW (ref 32.0–36.0)
MCV: 77.7 fL — ABNORMAL LOW (ref 80.0–100.0)
MPV: 11 fL (ref 7.5–12.5)
Monocytes Relative: 16.5 %
Neutro Abs: 1336 cells/uL — ABNORMAL LOW (ref 1500–7800)
Neutrophils Relative %: 37.1 %
Platelets: 256 10*3/uL (ref 140–400)
RBC: 4.71 10*6/uL (ref 4.20–5.80)
RDW: 12.7 % (ref 11.0–15.0)
Total Lymphocyte: 41.6 %
WBC: 3.6 10*3/uL — ABNORMAL LOW (ref 3.8–10.8)

## 2022-08-19 LAB — LIPID PANEL
Cholesterol: 130 mg/dL (ref <200)
HDL: 46 mg/dL (ref >=40)
LDL Cholesterol (Calc): 68 mg/dL (calc)
Non-HDL Cholesterol (Calc): 84 mg/dL (calc) (ref <130)
Total CHOL/HDL Ratio: 2.8 (calc) (ref <5.0)
Triglycerides: 80 mg/dL (ref <150)

## 2022-08-19 LAB — COMPLETE METABOLIC PANEL WITH GFR
AG Ratio: 1.6 (calc) (ref 1.0–2.5)
ALT: 41 U/L (ref 9–46)
AST: 23 U/L (ref 10–35)
Albumin: 3.9 g/dL (ref 3.6–5.1)
Alkaline phosphatase (APISO): 166 U/L — ABNORMAL HIGH (ref 35–144)
BUN/Creatinine Ratio: 18 (calc) (ref 6–22)
BUN: 12 mg/dL (ref 7–25)
CO2: 29 mmol/L (ref 20–32)
Calcium: 9.3 mg/dL (ref 8.6–10.3)
Chloride: 106 mmol/L (ref 98–110)
Creat: 0.67 mg/dL — ABNORMAL LOW (ref 0.70–1.30)
Globulin: 2.5 g/dL (calc) (ref 1.9–3.7)
Glucose, Bld: 87 mg/dL (ref 65–99)
Potassium: 4.6 mmol/L (ref 3.5–5.3)
Sodium: 141 mmol/L (ref 135–146)
Total Bilirubin: 0.4 mg/dL (ref 0.2–1.2)
Total Protein: 6.4 g/dL (ref 6.1–8.1)
eGFR: 113 mL/min/{1.73_m2} (ref >=60)

## 2022-08-19 LAB — MAGNESIUM: Magnesium: 1.7 mg/dL (ref 1.5–2.5)

## 2022-08-19 LAB — HEPATITIS C ANTIBODY: Hepatitis C Ab: NONREACTIVE

## 2022-09-02 NOTE — Progress Notes (Signed)
Remote ICD transmission.   

## 2022-09-25 ENCOUNTER — Ambulatory Visit: Payer: 59 | Admitting: Internal Medicine

## 2022-09-29 ENCOUNTER — Encounter: Payer: Self-pay | Admitting: Physician Assistant

## 2022-09-29 ENCOUNTER — Ambulatory Visit (INDEPENDENT_AMBULATORY_CARE_PROVIDER_SITE_OTHER): Payer: 59 | Admitting: Physician Assistant

## 2022-09-29 VITALS — BP 116/68 | HR 91 | Temp 98.0°F | Resp 16 | Ht 73.0 in | Wt 161.7 lb

## 2022-09-29 DIAGNOSIS — R1012 Left upper quadrant pain: Secondary | ICD-10-CM | POA: Diagnosis not present

## 2022-09-29 DIAGNOSIS — Z125 Encounter for screening for malignant neoplasm of prostate: Secondary | ICD-10-CM

## 2022-09-29 DIAGNOSIS — R748 Abnormal levels of other serum enzymes: Secondary | ICD-10-CM | POA: Diagnosis not present

## 2022-09-29 DIAGNOSIS — Z1211 Encounter for screening for malignant neoplasm of colon: Secondary | ICD-10-CM | POA: Diagnosis not present

## 2022-09-29 LAB — POCT URINALYSIS DIPSTICK
Bilirubin, UA: NEGATIVE
Blood, UA: NEGATIVE
Glucose, UA: NEGATIVE
Ketones, UA: NEGATIVE
Leukocytes, UA: NEGATIVE
Nitrite, UA: NEGATIVE
Protein, UA: NEGATIVE
Spec Grav, UA: 1.025 (ref 1.010–1.025)
Urobilinogen, UA: 0.2 E.U./dL — AB
pH, UA: 5 (ref 5.0–8.0)

## 2022-09-29 NOTE — Progress Notes (Unsigned)
Acute Office Visit   Patient: Blake Moore   DOB: [birth year]   51 y.o. Male  MRN: [id number] Visit Date: 09/29/2022  Today's healthcare provider: Oswaldo Conroy Faiza Bansal, PA-C  Introduced myself to the patient as a Secondary school teacher and provided education on APPs in clinical practice.    Chief Complaint  Patient presents with   Abdominal Pain    Left side around navel, onset for couple weeks   Nausea   Subjective    HPI HPI     Abdominal Pain    Additional comments: Left side around navel, onset for couple weeks      Last edited by [name], CMA on 09/29/2022  3:06 PM.        Abdominal Pain  Ongoing for a few weeks  Reports he sometimes gets stomach aches and he is not sure why He reports stomach pains in the LUQ  Pain level and character: slight pain for several minutes then resolves, feels crampy in nature   Usually happens about 30 minutes after he finishes eating, around noon as he eats around 10;30 in the AM  He has tried eliminating eggs from diet to see if this is a cause. Reports pain did not happen today and he did not eat eggs this AM  He reports the pain only seems to happen at work during the day time  Interventions: nothing  He reports some flank pain on the left side for the past week  He states he has been using a back brace to help with pain  He denies heartburn symptoms with the abdominal cramping   He reports that he has cut down on drinking beer- he states he has stopped drinking beer completely after his last lab results were completed   Medications: Outpatient Medications Prior to Visit  Medication Sig   albuterol (VENTOLIN HFA) 108 (90 Base) MCG/ACT inhaler Inhale 2 puffs into the lungs every 6 (six) hours as needed for wheezing or shortness of breath.   carvedilol (COREG) 6.25 MG tablet Take 1 tablet (6.25 mg total) by mouth 2 (two) times daily with a meal.   fluticasone (FLONASE) 50 MCG/ACT nasal spray Place 2 sprays into both nostrils  daily.   losartan (COZAAR) 25 MG tablet Take 0.5 tablets (12.5 mg total) by mouth daily.   rosuvastatin (CRESTOR) 10 MG tablet Take 1 tablet (10 mg total) by mouth daily.   No facility-administered medications prior to visit.    Review of Systems  Constitutional:  Negative for chills and fever.  Gastrointestinal:  Positive for abdominal pain, diarrhea and nausea. Negative for abdominal distention, blood in stool, constipation, rectal pain and vomiting.  Genitourinary:  Positive for flank pain. Negative for dysuria, frequency, hematuria, penile pain, penile swelling, scrotal swelling and testicular pain.  Musculoskeletal:  Positive for back pain.    {Labs  Heme  Chem  Endocrine  Serology  Results Review (optional):23779}   Objective    BP 116/68   Pulse 91   Temp 98 F (36.7 C) (Oral)   Resp 16   Ht 6\' 1"  (1.854 m)   Wt 161 lb 11.2 oz (73.3 kg)   SpO2 99%   BMI 21.33 kg/m  {Show previous vital signs (optional):23777}  Physical Exam Vitals reviewed.  Constitutional:      General: He is awake.     Appearance: Normal appearance. He is well-developed and well-groomed.  HENT:     Head: Normocephalic and atraumatic.  Cardiovascular:     Rate and Rhythm: Normal rate and regular rhythm.     Heart sounds: Normal heart sounds. No murmur heard.    No friction rub. No gallop.  Pulmonary:     Effort: Pulmonary effort is normal.     Breath sounds: Normal breath sounds. No decreased air movement. No decreased breath sounds, wheezing, rhonchi or rales.  Abdominal:     General: Abdomen is flat. Bowel sounds are normal.     Palpations: Abdomen is soft.     Tenderness: There is no abdominal tenderness. There is left CVA tenderness. There is no right CVA tenderness.  Neurological:     Mental Status: He is alert.  Psychiatric:        Behavior: Behavior is cooperative.       No results found for any visits on 09/29/22.  Assessment & Plan      No follow-ups on file.       Problem List Items Addressed This Visit   None Visit Diagnoses     Left upper quadrant abdominal pain    -  Primary Acute, new concern Patient reports intermittent left upper quadrant abdominal pain over the past few weeks that occurs for several minutes and feels like a cramping sensation for resolving Patient is unsure if there are definitive triggers for this but notes that it seems to only occur at work and has not occurred on the days that he has refrained from eating eggs. Patient also reports left-sided flank pain that improves with gentle stretching and massage. Physical exam was overall normal and reassuring today. Given that symptoms are overall rather vague I recommend that he keeps a symptom journal which includes dietary intake, pain occurrences, severity, pain characteristics, bowel habits. Today I have also ordered CMP, CBC, amylase, lipase, CRP, ESR for rule out.  Results to dictate further management. Given his for concerns of flank pain for the last week I have also ordered a UA to check for occult hematuria Recommend follow-up in about 4 weeks to review his symptom journal with him.  Follow-up sooner if symptoms worsen or progress. Have also provided return and ED precautions especially those pertaining to nephrolithiasis    Relevant Orders   CBC w/Diff/Platelet   COMPLETE METABOLIC PANEL WITH GFR   Sed Rate (ESR)   C-reactive protein   Lipase   Amylase   POCT urinalysis dipstick   Colon cancer screening     Patient expressed concern regarding colon cancer. We reviewed the differences in colon cancer screening services such as Cologuard, colonoscopy. Patient reports that he does not have any family history of colon cancer that he is aware of so Cologuard is likely a sound option for him. Cologuard orders provided today for screening.  Results to dictate further management.   Relevant Orders   Cologuard   Prostate cancer screening       Relevant Orders   PSA         No follow-ups on file.   I, Burnette Sautter E Shardae Kleinman, PA-C, have reviewed all documentation for this visit. The documentation on 09/29/22 for the exam, diagnosis, procedures, and orders are all accurate and complete.   Jacquelin Hawking, MHS, PA-C [facility]

## 2022-09-29 NOTE — Patient Instructions (Addendum)
Your stomach pain can have several different causes. For now I recommend keeping a symptom journal so that we can try to determine if there are particular triggers that are causing her stomach pain. Things to include your symptom journal would be things that you have eaten and drunk throughout the day as well as when your pain occurred, what it felt like, how long it lasted, where it was.  Other things to include would be if you feel nauseous, start to vomit, have diarrhea or constipation, fevers. We will keep you updated on the results of your test results once they are available. Please let us know if you start to develop pain with urination, blood in your urine, fevers, chills, more intense mid back pain.  These could be signs of a potential kidney stone.

## 2022-09-30 LAB — CBC WITH DIFFERENTIAL/PLATELET
Absolute Monocytes: 517 cells/uL (ref 200–950)
Basophils Absolute: 11 cells/uL (ref 0–200)
Basophils Relative: 0.3 %
Eosinophils Absolute: 118 cells/uL (ref 15–500)
Eosinophils Relative: 3.1 %
HCT: 40.2 % (ref 38.5–50.0)
Hemoglobin: 12.6 g/dL — ABNORMAL LOW (ref 13.2–17.1)
Lymphs Abs: 1691 cells/uL (ref 850–3900)
MCH: 24.1 pg — ABNORMAL LOW (ref 27.0–33.0)
MCHC: 31.3 g/dL — ABNORMAL LOW (ref 32.0–36.0)
MCV: 77 fL — ABNORMAL LOW (ref 80.0–100.0)
MPV: 12 fL (ref 7.5–12.5)
Monocytes Relative: 13.6 %
Neutro Abs: 1463 cells/uL — ABNORMAL LOW (ref 1500–7800)
Neutrophils Relative %: 38.5 %
Platelets: 283 10*3/uL (ref 140–400)
RBC: 5.22 10*6/uL (ref 4.20–5.80)
RDW: 12.6 % (ref 11.0–15.0)
Total Lymphocyte: 44.5 %
WBC: 3.8 10*3/uL (ref 3.8–10.8)

## 2022-09-30 LAB — COMPLETE METABOLIC PANEL WITH GFR
AG Ratio: 1.5 (calc) (ref 1.0–2.5)
ALT: 50 U/L — ABNORMAL HIGH (ref 9–46)
AST: 28 U/L (ref 10–35)
Albumin: 4.3 g/dL (ref 3.6–5.1)
Alkaline phosphatase (APISO): 220 U/L — ABNORMAL HIGH (ref 35–144)
BUN/Creatinine Ratio: 25 (calc) — ABNORMAL HIGH (ref 6–22)
BUN: 14 mg/dL (ref 7–25)
CO2: 30 mmol/L (ref 20–32)
Calcium: 10.2 mg/dL (ref 8.6–10.3)
Chloride: 103 mmol/L (ref 98–110)
Creat: 0.56 mg/dL — ABNORMAL LOW (ref 0.70–1.30)
Globulin: 2.8 g/dL (calc) (ref 1.9–3.7)
Glucose, Bld: 88 mg/dL (ref 65–99)
Potassium: 4.5 mmol/L (ref 3.5–5.3)
Sodium: 141 mmol/L (ref 135–146)
Total Bilirubin: 0.5 mg/dL (ref 0.2–1.2)
Total Protein: 7.1 g/dL (ref 6.1–8.1)
eGFR: 119 mL/min/{1.73_m2} (ref >=60)

## 2022-09-30 LAB — SEDIMENTATION RATE: Sed Rate: 2 mm/h (ref 0–20)

## 2022-09-30 LAB — C-REACTIVE PROTEIN: CRP: 4 mg/L (ref <8.0)

## 2022-09-30 LAB — AMYLASE: Amylase: 57 U/L (ref 21–101)

## 2022-09-30 LAB — LIPASE: Lipase: 43 U/L (ref 7–60)

## 2022-09-30 LAB — PSA: PSA: 0.58 ng/mL (ref <=4.00)

## 2022-10-02 NOTE — Progress Notes (Signed)
Your lab results have returned. Your liver enzymes are still elevated and one of these has increased a bit more than it was when you had your last apt in April. I would like you to come back to the office and get a few more labs done to further investigate these results. Please come in over the next few days, no apt required, to get your labs drawn.  Your lab values that are associated with your pancrease and prostate were normal.  Your inflammatory markers were also normal as well which is good.  Your electrolytes were normal and your kidney function looks good too. Your blood count shows that you are mildly anemic but this appears to be an ongoing thing that is actually improving compared to your previous labs. For now I recommend that you keep the symptom journal that we spoke about during your apt and make a follow up apt with [place] in about 2-3 weeks to discuss your journal and your symptoms further. Let us know if you have further questions or concerns.

## 2022-10-02 NOTE — Progress Notes (Signed)
There are many reasons that these enzymes may be elevated and sometimes it is not the liver that is doing it which is why I have ordered the follow up testing. Sometimes the enzymes can be elevated from viral illnesses, like hepatitis, increased Tylenol use, other medications and supplements. This does not mean you should stop taking any of your current medications unless you are instructed otherwise. Your enzymes are not significantly elevated to the point that I am super concerned but it is something we should keep an eye on. Your liver is still working well and your exam was reassuring.

## 2022-10-02 NOTE — Addendum Note (Signed)
Addended by: Jacquelin Hawking on: 10/02/2022 11:48 AM   Modules accepted: Orders

## 2022-10-05 DIAGNOSIS — Z1211 Encounter for screening for malignant neoplasm of colon: Secondary | ICD-10-CM | POA: Diagnosis not present

## 2022-10-09 ENCOUNTER — Ambulatory Visit: Payer: Self-pay

## 2022-10-09 NOTE — Telephone Encounter (Signed)
2nd attempt, Patient called, left VM to return the call to the office to discuss medication with a nurse.  

## 2022-10-09 NOTE — Telephone Encounter (Addendum)
  Chief Complaint: medication problem Symptoms: NA Frequency:  Pertinent Negatives: NA Disposition: [] ED /[] Urgent Care (no appt availability in office) / [] Appointment(In office/virtual)/ []  [facility]/ [x] Home Care/ [] Refused Recommended Disposition /[] [place] [facility]/ []  Follow-up with PCP Additional Notes: pt called back, he states he is concerned about rosuvastatin causing elevated liver enzymes since he doesn't drink alcohol or take Tylenol. Advised pt that per last lab notes, [place], [place] wasn't too concerned about elevation but wants to continue to monitor. Pt states he is saving up to come back in for OV on 11/17/22 and if gets enough money before he will call back to schedule. Pt has been taking milk thistle x 2 days for liver health to hopefully help with elevation. Advised him to continue rosuvastatin for now. No further assistance needed.   Summary: Medcation Advice    Pt is calling to get advice on his liver enzymes. Pt read that rosuvastatin (CRESTOR) 10 MG tablet [[id number]] can cause his enzymes to be elevated. Pt has started taking milk thistle. Please advise

## 2022-10-09 NOTE — Telephone Encounter (Signed)
Patient called, left VM to return the call to the office to discuss medication with a nurse.  Summary: Medcation Advice   Pt is calling to get advice on his liver enzymes. Pt read that rosuvastatin (CRESTOR) 10 MG tablet [[id number]] can cause his enzymes to be elevated. Pt has started taking milk thistle. Please advise

## 2022-10-09 NOTE — Telephone Encounter (Signed)
Reason for Disposition . Caller has medicine question only, adult not sick, AND triager answers question  Answer Assessment - Initial Assessment Questions 1. NAME of MEDICINE: "What medicine(s) are you calling about?"     Rosuvastatin  2. QUESTION: "What is your question?" (e.g., double dose of medicine, side effect)     Concerned about cause of liver enzymes  3. PRESCRIBER: "Who prescribed the medicine?" Reason: if prescribed by specialist, call should be referred to that group.     Dr. Caralee Ates  4. SYMPTOMS: "Do you have any symptoms?" If Yes, ask: "What symptoms are you having?"  "How bad are the symptoms (e.g., mild, moderate, severe)     no  Protocols used: Medication Question Call-A-AH

## 2022-10-12 LAB — COLOGUARD: COLOGUARD: NEGATIVE

## 2022-10-17 NOTE — Progress Notes (Signed)
Your cologuard was negative for signs of colon cancer DNA. We should repeat this in 3 years for regular screening

## 2022-10-18 ENCOUNTER — Other Ambulatory Visit: Payer: Self-pay | Admitting: Internal Medicine

## 2022-10-18 DIAGNOSIS — I252 Old myocardial infarction: Secondary | ICD-10-CM

## 2022-10-18 DIAGNOSIS — I428 Other cardiomyopathies: Secondary | ICD-10-CM

## 2022-10-20 NOTE — Telephone Encounter (Signed)
Requested Prescriptions  Pending Prescriptions Disp Refills   carvedilol (COREG) 6.25 MG tablet [Pharmacy Med Name: Carvedilol 6.25 MG Oral Tablet] 180 tablet 0    Sig: TAKE 1 TABLET BY MOUTH TWICE DAILY WITH A MEAL     Cardiovascular: Beta Blockers 3 Failed - 10/18/2022 10:50 AM      Failed - Cr in normal range and within 360 days    Creat  Date Value Ref Range Status  09/29/2022 0.56 (L) 0.70 - 1.30 mg/dL Final         Failed - ALT in normal range and within 360 days    ALT  Date Value Ref Range Status  09/29/2022 50 (H) 9 - 46 U/L Final         Passed - AST in normal range and within 360 days    AST  Date Value Ref Range Status  09/29/2022 28 10 - 35 U/L Final         Passed - Last BP in normal range    BP Readings from Last 1 Encounters:  09/29/22 116/68         Passed - Last Heart Rate in normal range    Pulse Readings from Last 1 Encounters:  09/29/22 91         Passed - Valid encounter within last 6 months    Recent Outpatient Visits           3 weeks ago Left upper quadrant abdominal pain   [place] [facility]um, Oswaldo Conroy, PA-C   2 months ago Nonischemic cardiomyopathy [facility])   [place] Margarita Mail, DO   3 months ago Nonischemic cardiomyopathy [facility])   [facility] Margarita Mail, DO       Future Appointments             In 4 weeks Margarita Mail, DO  [facility]             rosuvastatin (CRESTOR) 10 MG tablet [Pharmacy Med Name: Rosuvastatin Calcium 10 MG Oral Tablet] 90 tablet 0    Sig: Take 1 tablet by mouth once daily     Cardiovascular:  Antilipid - Statins 2 Failed - 10/18/2022 10:50 AM      Failed - Cr in normal range and within 360 days    Creat  Date Value Ref Range Status  09/29/2022 0.56 (L) 0.70 - 1.30 mg/dL Final         Failed - Lipid Panel in normal range within the last 12 months    Cholesterol  Date Value  Ref Range Status  08/18/2022 130 <200 mg/dL Final   LDL Cholesterol (Calc)  Date Value Ref Range Status  08/18/2022 68 mg/dL (calc) Final    Comment:    Reference range: <100 . Desirable range <100 mg/dL for primary prevention;   <70 mg/dL for patients with CHD or diabetic patients  with > or = 2 CHD risk factors. Marland Kitchen LDL-C is now calculated using the Martin-Hopkins  calculation, which is a validated novel method providing  better accuracy than the Friedewald equation in the  estimation of LDL-C.  Horald Pollen et al. Lenox Ahr. 1610;960(45): 2061-2068  (http://education.[employer].com/faq/FAQ164)    HDL  Date Value Ref Range Status  08/18/2022 46 > OR = 40 mg/dL Final   Triglycerides  Date Value Ref Range Status  08/18/2022 80 <150 mg/dL Final         Passed - Patient is  not pregnant      Passed - Valid encounter within last 12 months    Recent Outpatient Visits           3 weeks ago Left upper quadrant abdominal pain   [place] [facility] Mecum, Oswaldo Conroy, PA-C   2 months ago Nonischemic cardiomyopathy [facility])   [place] [facility] Margarita Mail, DO   3 months ago Nonischemic cardiomyopathy [facility])   [place] [facility] Margarita Mail, DO       Future Appointments             In 4 weeks Margarita Mail, DO [place] [facility]             losartan (COZAAR) 25 MG tablet [Pharmacy Med Name: Losartan Potassium 25 MG Oral Tablet] 45 tablet 0    Sig: Take 1/2 (one-half) tablet by mouth once daily     Cardiovascular:  Angiotensin Receptor Blockers Failed - 10/18/2022 10:50 AM      Failed - Cr in normal range and within 180 days    Creat  Date Value Ref Range Status  09/29/2022 0.56 (L) 0.70 - 1.30 mg/dL Final         Passed - K in normal range and within 180 days    Potassium  Date Value Ref Range Status  09/29/2022 4.5 3.5 - 5.3 mmol/L Final         Passed - Patient is  not pregnant      Passed - Last BP in normal range    BP Readings from Last 1 Encounters:  09/29/22 116/68         Passed - Valid encounter within last 6 months    Recent Outpatient Visits           3 weeks ago Left upper quadrant abdominal pain   [place] [facility], Oswaldo Conroy, PA-C   2 months ago Nonischemic cardiomyopathy [facility])   [facility] Margarita Mail, DO   3 months ago Nonischemic cardiomyopathy [facility])   [facility] Margarita Mail, DO       Future Appointments             In 4 weeks Margarita Mail, DO [facility]

## 2022-10-23 IMAGING — CR DG CHEST 2V
2 series · 2 of 2 positions shown · non-contrast
Comparison: 10/26/2021

CLINICAL DATA: ICD

EXAM:
CHEST - 2 VIEW

[chest pa]
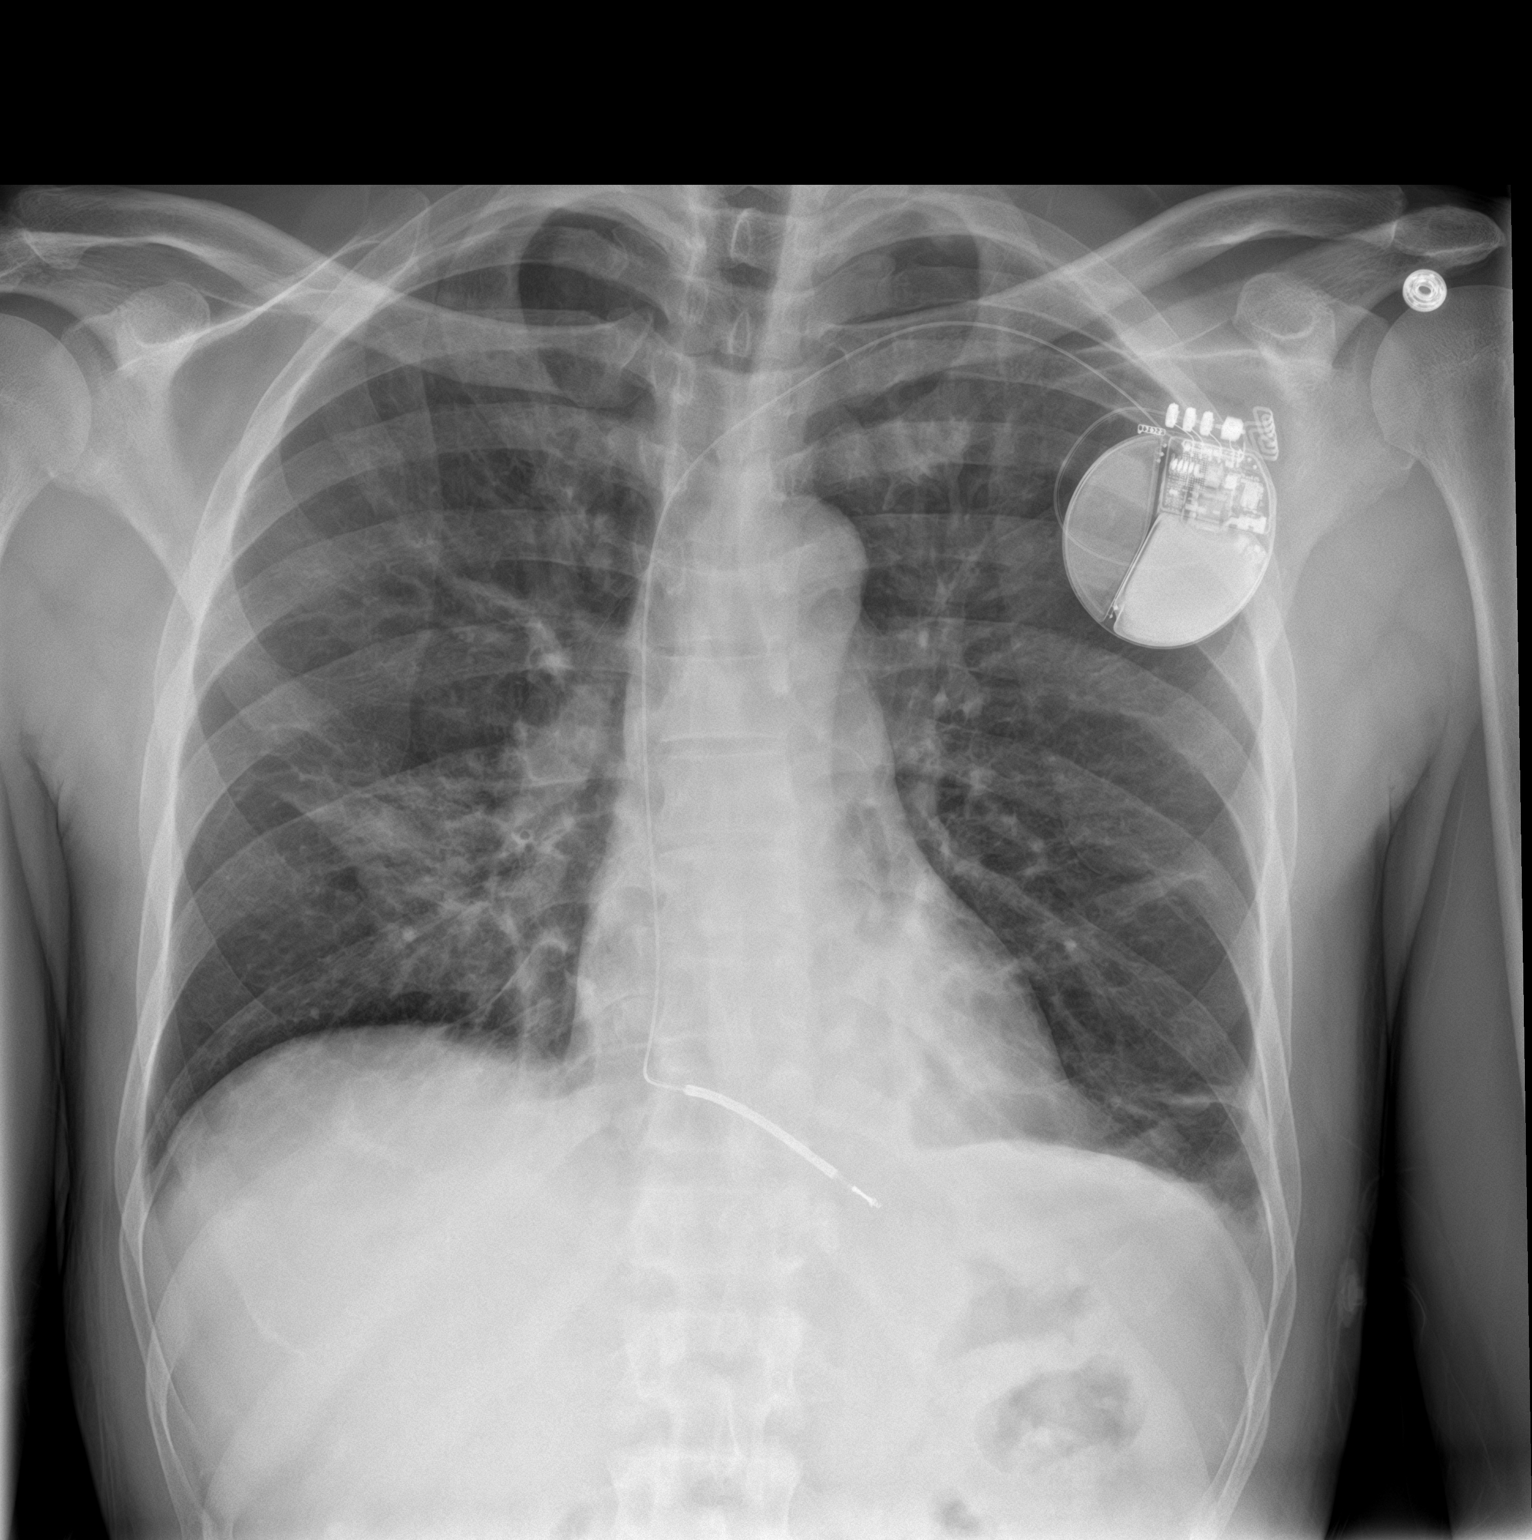

[chest lat]
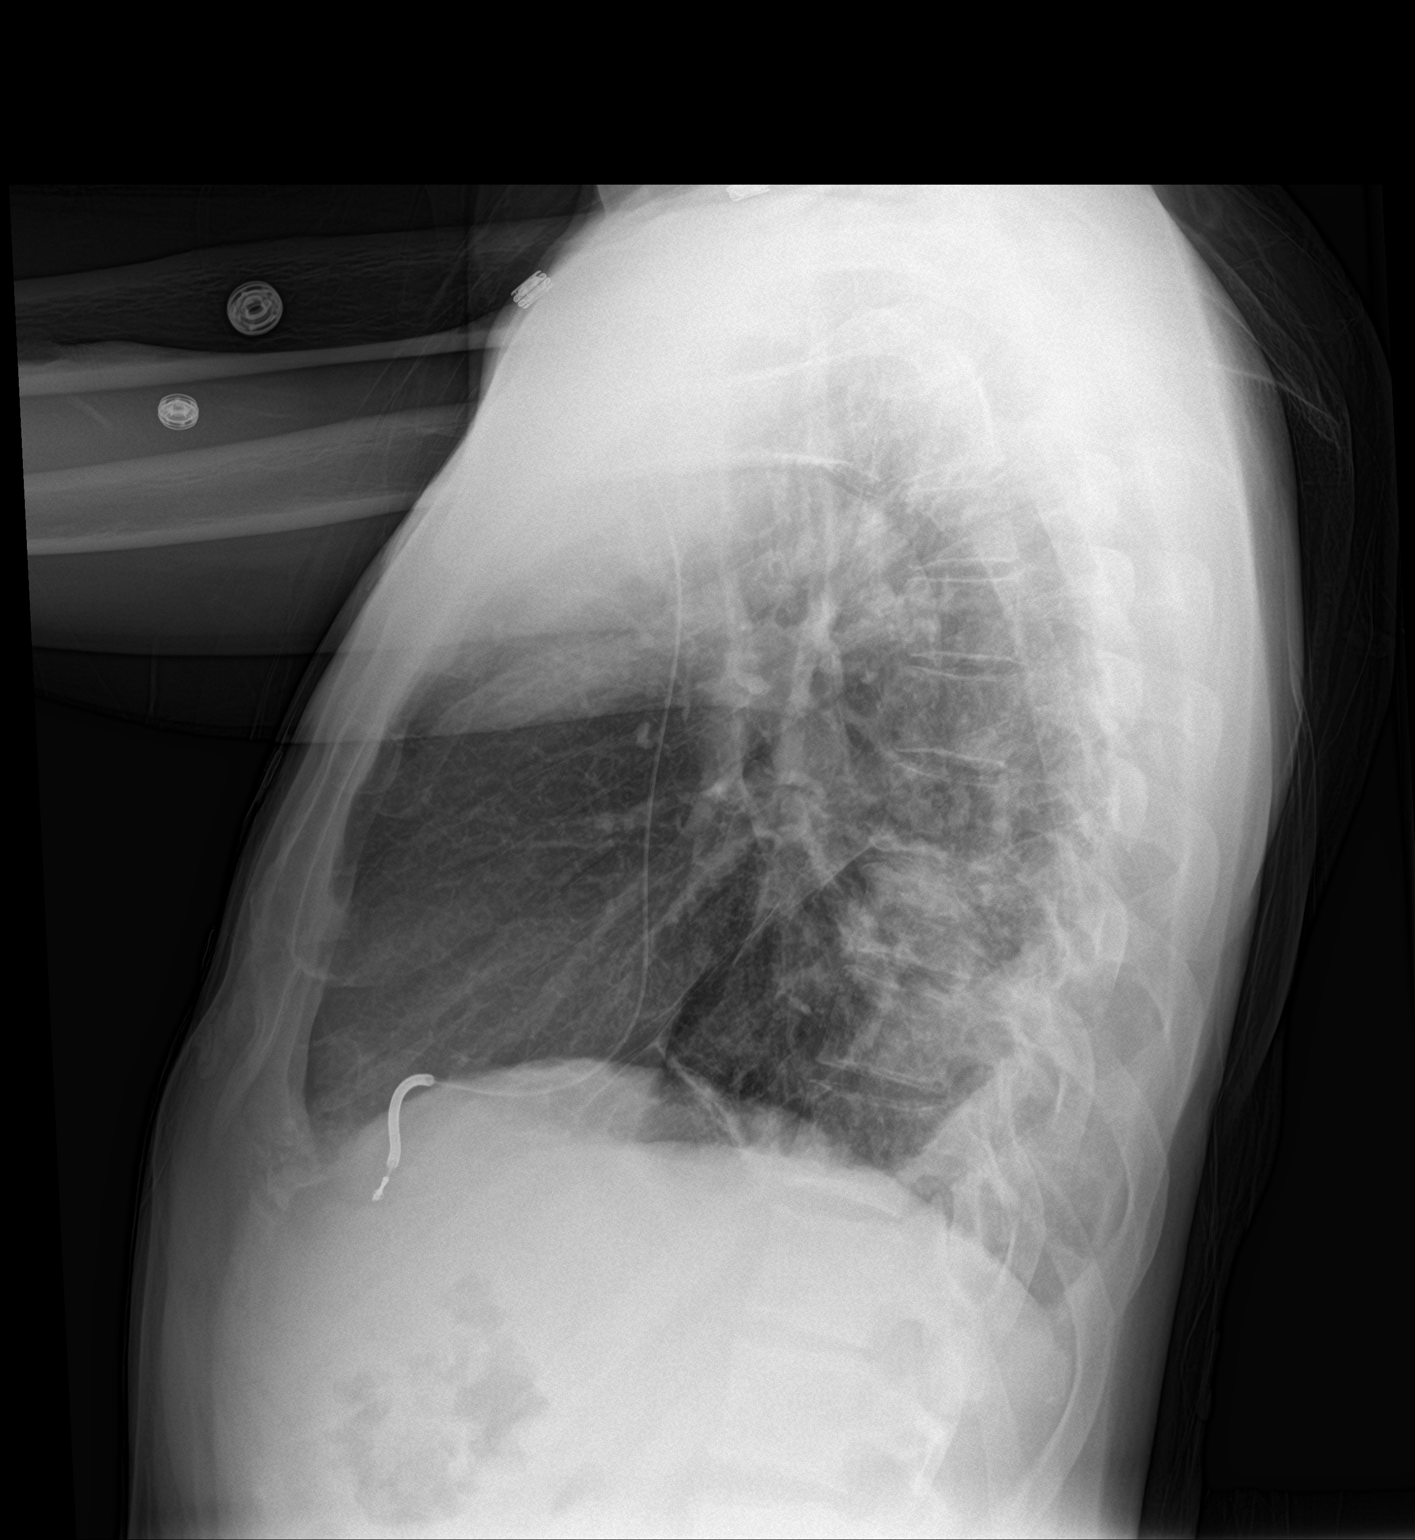

[2 of 2 positions shown; findings below may reference images not displayed]

FINDINGS: New left chest wall single lead ICD. Lead overlies the right
ventricle. No pneumothorax. Persistent but improved patchy opacities
at the lung bases. Normal heart size.
IMPRESSION: Single lead ICD with tip overlying the right ventricle. No
pneumothorax.

Persistent but improved basilar opacities.

## 2022-10-30 ENCOUNTER — Ambulatory Visit (INDEPENDENT_AMBULATORY_CARE_PROVIDER_SITE_OTHER): Payer: 59

## 2022-10-30 DIAGNOSIS — I428 Other cardiomyopathies: Secondary | ICD-10-CM

## 2022-10-30 LAB — CUP PACEART REMOTE DEVICE CHECK
Battery Remaining Longevity: 162 mo
Battery Remaining Percentage: 100 %
Brady Statistic RV Percent Paced: 0 %
Date Time Interrogation Session: 20240613022100
HighPow Impedance: 76 Ohm
Implantable Lead Connection Status: 753985
Implantable Lead Implant Date: 20230614
Implantable Lead Location: 753860
Implantable Lead Model: 673
Implantable Lead Serial Number: 196637
Implantable Pulse Generator Implant Date: 20230614
Lead Channel Impedance Value: 458 Ohm
Lead Channel Setting Pacing Amplitude: 2.5 V
Lead Channel Setting Pacing Pulse Width: 0.4 ms
Lead Channel Setting Sensing Sensitivity: 0.5 mV
Pulse Gen Serial Number: 287113
Zone Setting Status: 755011

## 2022-11-16 NOTE — Progress Notes (Deleted)
Established Patient Office Visit  Subjective    Patient ID: Blake Moore, male    DOB: [birth year]  Age: 52 y.o. MRN: [id number]  CC:  No chief complaint on file.   HPI Blake Moore presents to follow up on chronic medical conditions.  History of cardiac arrest with ventricular fibrillation/NICM/CHF: -Has ICD implant since 6/23 -Cardiac MRI 6/23 EF 50% with global hypokinesis -Currently on Coreg 6.25 mg twice daily, losartan 12.5 mg daily however he has been out of the losartan for the last few weeks. -Following with Cardiology, last seen 01/30/22  HLD/history of MI: -Medications: Restarted on Crestor 10 mg, needs refills. -Patient is compliant with above medications and reports no side effects.  -Last lipid panel: Due  Health Maintenance: -Blood work due -Colon cancer screening due   Outpatient Encounter Medications as of 11/17/2022  Medication Sig   albuterol (VENTOLIN HFA) 108 (90 Base) MCG/ACT inhaler Inhale 2 puffs into the lungs every 6 (six) hours as needed for wheezing or shortness of breath.   carvedilol (COREG) 6.25 MG tablet TAKE 1 TABLET BY MOUTH TWICE DAILY WITH A MEAL   fluticasone (FLONASE) 50 MCG/ACT nasal spray Place 2 sprays into both nostrils daily.   losartan (COZAAR) 25 MG tablet Take 1/2 (one-half) tablet by mouth once daily   rosuvastatin (CRESTOR) 10 MG tablet Take 1 tablet by mouth once daily   No facility-administered encounter medications on file as of 11/17/2022.    Past Medical History:  Diagnosis Date   Heart attack [facility])     Past Surgical History:  Procedure Laterality Date   ICD IMPLANT N/A 10/30/2021   Procedure: ICD IMPLANT;  Surgeon: Lanier Prude, MD;  [facility] INVASIVE CV LAB;  Service: Cardiovascular;  Laterality: N/A;   LEFT HEART CATH AND CORONARY ANGIOGRAPHY N/A 10/28/2021   Procedure: LEFT HEART CATH AND CORONARY ANGIOGRAPHY;  Surgeon: Iran Ouch, MD;  Location: [facility] INVASIVE CV LAB;  Service: Cardiovascular;   Laterality: N/A;   PACEMAKER IMPLANT      Family History  Problem Relation Age of Onset   Diabetes Mother    Hypertension Mother    Cancer Father     Social History   Socioeconomic History   Marital status: Single    Spouse name: Not on file   Number of children: Not on file   Years of education: Not on file   Highest education level: Not on file  Occupational History   Not on file  Tobacco Use   Smoking status: Former    Types: Cigarettes   Smokeless tobacco: Never  Vaping Use   Vaping Use: Never used  Substance and Sexual Activity   Alcohol use: Not Currently    Comment: occ   Drug use: Yes    Types: Marijuana   Sexual activity: Yes  Other Topics Concern   Not on file  Social History Narrative   Not on file   Social Determinants of Health   Financial Resource Strain: Not on file  Food Insecurity: Not on file  Transportation Needs: Not on file  Physical Activity: Not on file  Stress: Not on file  Social Connections: Not on file  Intimate Partner Violence: Not on file    Review of Systems  Constitutional:  Negative for chills and fever.  Respiratory:  Negative for shortness of breath.   Cardiovascular:  Negative for chest pain and palpitations.        Objective    There were no vitals taken for  this visit.  Physical Exam Constitutional:      Appearance: Normal appearance.  HENT:     Head: Normocephalic and atraumatic.  Eyes:     Conjunctiva/sclera: Conjunctivae normal.  Cardiovascular:     Rate and Rhythm: Normal rate and regular rhythm.  Pulmonary:     Effort: Pulmonary effort is normal.     Breath sounds: Normal breath sounds.  Musculoskeletal:     Right lower leg: No edema.     Left lower leg: No edema.  Skin:    General: Skin is warm and dry.     Comments: Lipoma present on back  Neurological:     General: No focal deficit present.     Mental Status: He is alert. Mental status is at baseline.  Psychiatric:        Mood and Affect:  Mood normal.        Behavior: Behavior normal.         Assessment & Plan:   1. Nonischemic cardiomyopathy/History of MI (myocardial infarction): Obtain labs today. Pharmacy called and he still has refills on current prescription. Continue Losartan 12.5 mg, Coreg 6.125 mg BID.  Continue Crestor 10 mg as well.  Follow up in 3 months.  - Magnesium - COMPLETE METABOLIC PANEL WITH GFR - CBC w/Diff/Platelet  2. Lipid screening/Need for hepatitis C screening test: Screening labs due.  - Lipid Profile - Hepatitis C Antibody   No follow-ups on file.   Margarita Mail, DO

## 2022-11-17 ENCOUNTER — Ambulatory Visit: Payer: 59 | Admitting: Internal Medicine

## 2022-11-19 NOTE — Progress Notes (Signed)
Remote ICD transmission.   

## 2022-12-15 ENCOUNTER — Ambulatory Visit: Payer: 59 | Admitting: Internal Medicine

## 2022-12-17 NOTE — Progress Notes (Deleted)
Established Patient Office Visit  Subjective    Patient ID: Blake Moore, male    DOB: 1970/07/22  Age: 52 y.o. MRN: [id number]  CC:  No chief complaint on file.   HPI Blake Moore presents to follow up on chronic medical conditions.  History of cardiac arrest with ventricular fibrillation/NICM/CHF: -Has ICD implant since 6/23 -Cardiac MRI 6/23 EF 50% with global hypokinesis -Currently on Coreg 6.25 mg twice daily, losartan 12.5 mg daily however he has been out of the losartan for the last few weeks. -Following with Cardiology, last seen 01/30/22  HLD/history of MI: -Medications: Restarted on Crestor 10 mg, needs refills. -Patient is compliant with above medications and reports no side effects.  -Last lipid panel: Due  Health Maintenance: -Blood work due -Colon cancer screening due   Outpatient Encounter Medications as of 12/18/2022  Medication Sig   albuterol (VENTOLIN HFA) 108 (90 Base) MCG/ACT inhaler Inhale 2 puffs into the lungs every 6 (six) hours as needed for wheezing or shortness of breath.   carvedilol (COREG) 6.25 MG tablet TAKE 1 TABLET BY MOUTH TWICE DAILY WITH A MEAL   fluticasone (FLONASE) 50 MCG/ACT nasal spray Place 2 sprays into both nostrils daily.   losartan (COZAAR) 25 MG tablet Take 1/2 (one-half) tablet by mouth once daily   rosuvastatin (CRESTOR) 10 MG tablet Take 1 tablet by mouth once daily   No facility-administered encounter medications on file as of 12/18/2022.    Past Medical History:  Diagnosis Date   Heart attack [facility])     Past Surgical History:  Procedure Laterality Date   ICD IMPLANT N/A 10/30/2021   Procedure: ICD IMPLANT;  Surgeon: Lanier Prude, MD;  Location: [facility] INVASIVE CV LAB;  Service: Cardiovascular;  Laterality: N/A;   LEFT HEART CATH AND CORONARY ANGIOGRAPHY N/A 10/28/2021   Procedure: LEFT HEART CATH AND CORONARY ANGIOGRAPHY;  Surgeon: Iran Ouch, MD;  Location: [facility] INVASIVE CV LAB;  Service: Cardiovascular;   Laterality: N/A;   PACEMAKER IMPLANT      Family History  Problem Relation Age of Onset   Diabetes Mother    Hypertension Mother    Cancer Father     Social History   Socioeconomic History   Marital status: Single    Spouse name: Not on file   Number of children: Not on file   Years of education: Not on file   Highest education level: Not on file  Occupational History   Not on file  Tobacco Use   Smoking status: Former    Types: Cigarettes   Smokeless tobacco: Never  Vaping Use   Vaping status: Never Used  Substance and Sexual Activity   Alcohol use: Not Currently    Comment: occ   Drug use: Yes    Types: Marijuana   Sexual activity: Yes  Other Topics Concern   Not on file  Social History Narrative   Not on file   Social Determinants of Health   Financial Resource Strain: Not on file  Food Insecurity: Not on file  Transportation Needs: Not on file  Physical Activity: Not on file  Stress: Not on file  Social Connections: Not on file  Intimate Partner Violence: Not on file    Review of Systems  Constitutional:  Negative for chills and fever.  Respiratory:  Negative for shortness of breath.   Cardiovascular:  Negative for chest pain and palpitations.        Objective    There were no vitals taken for  this visit.  Physical Exam Constitutional:      Appearance: Normal appearance.  HENT:     Head: Normocephalic and atraumatic.  Eyes:     Conjunctiva/sclera: Conjunctivae normal.  Cardiovascular:     Rate and Rhythm: Normal rate and regular rhythm.  Pulmonary:     Effort: Pulmonary effort is normal.     Breath sounds: Normal breath sounds.  Musculoskeletal:     Right lower leg: No edema.     Left lower leg: No edema.  Skin:    General: Skin is warm and dry.     Comments: Lipoma present on back  Neurological:     General: No focal deficit present.     Mental Status: He is alert. Mental status is at baseline.  Psychiatric:        Mood and  Affect: Mood normal.        Behavior: Behavior normal.         Assessment & Plan:   1. Nonischemic cardiomyopathy/History of MI (myocardial infarction): Obtain labs today. Pharmacy called and he still has refills on current prescription. Continue Losartan 12.5 mg, Coreg 6.125 mg BID.  Continue Crestor 10 mg as well.  Follow up in 3 months.  - Magnesium - COMPLETE METABOLIC PANEL WITH GFR - CBC w/Diff/Platelet  2. Lipid screening/Need for hepatitis C screening test: Screening labs due.  - Lipid Profile - Hepatitis C Antibody   No follow-ups on file.   Margarita Mail, DO

## 2022-12-18 ENCOUNTER — Ambulatory Visit: Payer: 59 | Admitting: Internal Medicine

## 2023-01-08 NOTE — Progress Notes (Signed)
Established Patient Office Visit  Subjective    Patient ID: Blake Moore, male    DOB: 1970-06-04  Age: 52 y.o. MRN: [id number]  CC:  Chief Complaint  Patient presents with   Follow-up   Abdominal Pain    Unable gain weight    HPI Blake Moore presents to follow up on chronic medical conditions.  Patient states at his last office visit here in May he discussed abdominal pain which is still ongoing today.  Patient states it is located in the left upper quadrant, nonradiating.  It occurs on and off and will last for several minutes.  He denies nausea, vomiting, changes in bowel movements, fevers or urinary symptoms.  Pain does not appear to be associated with eating.  Nothing seems to improve the pain.  Labs were obtained at his last office visit, lipase negative.  Alkaline phosphatase elevated and mildly anemic.  Patient did have a Cologuard that was negative earlier this year.  History of cardiac arrest with ventricular fibrillation/NICM/CHF: -Has ICD implant since 6/23 -Cardiac MRI 6/23 EF 50% with global hypokinesis -Currently on Coreg 6.25 mg twice daily, losartan 12.5 mg  -Following with Cardiology, last seen 01/30/22  HLD/history of MI: -Medications: Crestor 10 mg -Patient is compliant with above medications and reports no side effects.  -Last lipid panel: Lipid Panel     Component Value Date/Time   CHOL 130 08/18/2022 1520   TRIG 80 08/18/2022 1520   HDL 46 08/18/2022 1520   CHOLHDL 2.8 08/18/2022 1520   VLDL 8 10/25/2021 1115   LDLCALC 68 08/18/2022 1520    Health Maintenance: -Blood work due -Colon cancer screening; Cologuard negative 5/24  Outpatient Encounter Medications as of 01/09/2023  Medication Sig   carvedilol (COREG) 6.25 MG tablet TAKE 1 TABLET BY MOUTH TWICE DAILY WITH A MEAL   fluticasone (FLONASE) 50 MCG/ACT nasal spray Place 2 sprays into both nostrils daily.   losartan (COZAAR) 25 MG tablet Take 1/2 (one-half) tablet by mouth once daily    rosuvastatin (CRESTOR) 10 MG tablet Take 1 tablet by mouth once daily   albuterol (VENTOLIN HFA) 108 (90 Base) MCG/ACT inhaler Inhale 2 puffs into the lungs every 6 (six) hours as needed for wheezing or shortness of breath. (Patient not taking: Reported on 01/09/2023)   No facility-administered encounter medications on file as of 01/09/2023.    Past Medical History:  Diagnosis Date   Heart attack [facility])     Past Surgical History:  Procedure Laterality Date   ICD IMPLANT N/A 10/30/2021   Procedure: ICD IMPLANT;  Surgeon: Lanier Prude, MD;  Location: [facility] INVASIVE CV LAB;  Service: Cardiovascular;  Laterality: N/A;   LEFT HEART CATH AND CORONARY ANGIOGRAPHY N/A 10/28/2021   Procedure: LEFT HEART CATH AND CORONARY ANGIOGRAPHY;  Surgeon: Iran Ouch, MD;  Location: [facility] INVASIVE CV LAB;  Service: Cardiovascular;  Laterality: N/A;   PACEMAKER IMPLANT      Family History  Problem Relation Age of Onset   Diabetes Mother    Hypertension Mother    Cancer Father     Social History   Socioeconomic History   Marital status: Single    Spouse name: Not on file   Number of children: Not on file   Years of education: Not on file   Highest education level: Not on file  Occupational History   Not on file  Tobacco Use   Smoking status: Former    Types: Cigarettes   Smokeless tobacco: Never  Vaping  Use   Vaping status: Never Used  Substance and Sexual Activity   Alcohol use: Not Currently    Comment: occ   Drug use: Yes    Types: Marijuana   Sexual activity: Yes  Other Topics Concern   Not on file  Social History Narrative   Not on file   Social Determinants of Health   Financial Resource Strain: Not on file  Food Insecurity: Not on file  Transportation Needs: Not on file  Physical Activity: Not on file  Stress: Not on file  Social Connections: Not on file  Intimate Partner Violence: Not on file    Review of Systems  Constitutional:  Negative for chills and  fever.  Respiratory:  Negative for shortness of breath.   Cardiovascular:  Negative for chest pain and palpitations.  Gastrointestinal:  Positive for abdominal pain and heartburn. Negative for blood in stool, constipation, diarrhea, melena, nausea and vomiting.  Genitourinary:  Negative for dysuria and urgency.        Objective    BP 116/64   Pulse (!) 109   Temp 97.9 F (36.6 C)   Ht 6\' 1"  (1.854 m)   Wt 161 lb 8 oz (73.3 kg)   SpO2 99%   BMI 21.31 kg/m   Physical Exam Constitutional:      Appearance: Normal appearance.  HENT:     Head: Normocephalic and atraumatic.  Eyes:     Conjunctiva/sclera: Conjunctivae normal.  Cardiovascular:     Rate and Rhythm: Normal rate and regular rhythm.  Pulmonary:     Effort: Pulmonary effort is normal.     Breath sounds: Normal breath sounds.  Abdominal:     General: Bowel sounds are normal. There is no distension.     Palpations: Abdomen is soft.     Tenderness: There is no abdominal tenderness. There is no guarding or rebound.  Musculoskeletal:     Right lower leg: No edema.     Left lower leg: No edema.  Skin:    General: Skin is warm and dry.  Neurological:     General: No focal deficit present.     Mental Status: He is alert. Mental status is at baseline.  Psychiatric:        Mood and Affect: Mood normal.        Behavior: Behavior normal.         Assessment & Plan:    1. Colicky LUQ abdominal pain: Abdominal exam benign, however labs from May with elevated alkaline phosphatase and anemia.  Cologuard from earlier this year negative.  Will repeat labs including GGT and start daily PPI.  Patient will follow-up in 1 month for recheck, if symptoms not resolved we will need further imaging.  - COMPLETE METABOLIC PANEL WITH GFR - Gamma GT - omeprazole (PRILOSEC) 20 MG capsule; Take 1 capsule (20 mg total) by mouth daily.  Dispense: 30 capsule; Refill: 1  2. Anemia, unspecified type: Repeat CBC and iron panel today.  May  require colonoscopy.  - CBC w/Diff/Platelet - Fe+TIBC+Fer  3. Nonischemic cardiomyopathy (HCC)/History of MI (myocardial infarction): Blood pressure stable, no changes to medications made and appropriate refill sent to the pharmacy.  - carvedilol (COREG) 6.25 MG tablet; Take 1 tablet (6.25 mg total) by mouth 2 (two) times daily with a meal.  Dispense: 180 tablet; Refill: 1 - losartan (COZAAR) 25 MG tablet; Take 1 tablet (25 mg total) by mouth daily.  Dispense: 45 tablet; Refill: 2 - rosuvastatin (CRESTOR) 10 MG  tablet; Take 1 tablet (10 mg total) by mouth daily.  Dispense: 90 tablet; Refill: 1  4. Nasal congestion: Flonase refilled.  - fluticasone (FLONASE) 50 MCG/ACT nasal spray; Place 2 sprays into both nostrils daily.  Dispense: 16 g; Refill: 6   Return in about 4 weeks (around 02/06/2023).   Margarita Mail, DO

## 2023-01-09 ENCOUNTER — Ambulatory Visit (INDEPENDENT_AMBULATORY_CARE_PROVIDER_SITE_OTHER): Payer: 59 | Admitting: Internal Medicine

## 2023-01-09 ENCOUNTER — Encounter: Payer: Self-pay | Admitting: Internal Medicine

## 2023-01-09 VITALS — BP 116/64 | HR 109 | Temp 97.9°F | Ht 73.0 in | Wt 161.5 lb

## 2023-01-09 DIAGNOSIS — R0981 Nasal congestion: Secondary | ICD-10-CM | POA: Diagnosis not present

## 2023-01-09 DIAGNOSIS — R748 Abnormal levels of other serum enzymes: Secondary | ICD-10-CM | POA: Diagnosis not present

## 2023-01-09 DIAGNOSIS — D649 Anemia, unspecified: Secondary | ICD-10-CM

## 2023-01-09 DIAGNOSIS — I428 Other cardiomyopathies: Secondary | ICD-10-CM

## 2023-01-09 DIAGNOSIS — R1012 Left upper quadrant pain: Secondary | ICD-10-CM | POA: Diagnosis not present

## 2023-01-09 DIAGNOSIS — I252 Old myocardial infarction: Secondary | ICD-10-CM

## 2023-01-09 MED ORDER — ROSUVASTATIN CALCIUM 10 MG PO TABS: 10.0000 mg | ORAL_TABLET | Freq: Every day | ORAL | 1 refills | Status: DC

## 2023-01-09 MED ORDER — OMEPRAZOLE 20 MG PO CPDR: 20.0000 mg | DELAYED_RELEASE_CAPSULE | Freq: Every day | ORAL | 1 refills | Status: DC

## 2023-01-09 MED ORDER — LOSARTAN POTASSIUM 25 MG PO TABS: 25.0000 mg | ORAL_TABLET | Freq: Every day | ORAL | 2 refills | Status: DC

## 2023-01-09 MED ORDER — FLUTICASONE PROPIONATE 50 MCG/ACT NA SUSP
2.0000 | Freq: Every day | NASAL | 6 refills | Status: DC
Start: 2023-01-09 — End: 2023-06-22

## 2023-01-09 MED ORDER — CARVEDILOL 6.25 MG PO TABS
6.2500 mg | ORAL_TABLET | Freq: Two times a day (BID) | ORAL | 1 refills | Status: DC
Start: 2023-01-09 — End: 2023-06-26

## 2023-01-10 LAB — CBC WITH DIFFERENTIAL/PLATELET
Absolute Monocytes: 553 {cells}/uL (ref 200–950)
Basophils Absolute: 11 {cells}/uL (ref 0–200)
Basophils Relative: 0.3 %
Eosinophils Absolute: 119 {cells}/uL (ref 15–500)
Eosinophils Relative: 3.4 %
HCT: 38.2 % — ABNORMAL LOW (ref 38.5–50.0)
Hemoglobin: 12 g/dL — ABNORMAL LOW (ref 13.2–17.1)
Lymphs Abs: 1600 {cells}/uL (ref 850–3900)
MCH: 24 pg — ABNORMAL LOW (ref 27.0–33.0)
MCHC: 31.4 g/dL — ABNORMAL LOW (ref 32.0–36.0)
MCV: 76.4 fL — ABNORMAL LOW (ref 80.0–100.0)
MPV: 11.9 fL (ref 7.5–12.5)
Monocytes Relative: 15.8 %
Neutro Abs: 1218 {cells}/uL — ABNORMAL LOW (ref 1500–7800)
Neutrophils Relative %: 34.8 %
Platelets: 235 Thousand/uL (ref 140–400)
RBC: 5 Million/uL (ref 4.20–5.80)
RDW: 12.7 % (ref 11.0–15.0)
Total Lymphocyte: 45.7 %
WBC: 3.5 Thousand/uL — ABNORMAL LOW (ref 3.8–10.8)

## 2023-01-10 LAB — IRON,TIBC AND FERRITIN PANEL
%SAT: 35 % (ref 20–48)
Ferritin: 110 ng/mL (ref 38–380)
Iron: 85 ug/dL (ref 50–180)
TIBC: 246 ug/dL — ABNORMAL LOW (ref 250–425)

## 2023-01-10 LAB — COMPLETE METABOLIC PANEL WITH GFR
AG Ratio: 1.8 (calc) (ref 1.0–2.5)
ALT: 52 U/L — ABNORMAL HIGH (ref 9–46)
AST: 28 U/L (ref 10–35)
Albumin: 4.2 g/dL (ref 3.6–5.1)
Alkaline phosphatase (APISO): 232 U/L — ABNORMAL HIGH (ref 35–144)
BUN/Creatinine Ratio: 24 (calc) — ABNORMAL HIGH (ref 6–22)
BUN: 14 mg/dL (ref 7–25)
CO2: 30 mmol/L (ref 20–32)
Calcium: 9.2 mg/dL (ref 8.6–10.3)
Chloride: 105 mmol/L (ref 98–110)
Creat: 0.59 mg/dL — ABNORMAL LOW (ref 0.70–1.30)
Globulin: 2.4 g/dL (ref 1.9–3.7)
Glucose, Bld: 113 mg/dL — ABNORMAL HIGH (ref 65–99)
Potassium: 4.3 mmol/L (ref 3.5–5.3)
Sodium: 141 mmol/L (ref 135–146)
Total Bilirubin: 0.5 mg/dL (ref 0.2–1.2)
Total Protein: 6.6 g/dL (ref 6.1–8.1)
eGFR: 117 mL/min/{1.73_m2} (ref >=60)

## 2023-01-10 LAB — GAMMA GT: GGT: 90 U/L (ref 3–95)

## 2023-01-14 NOTE — Addendum Note (Signed)
Addended by: Margarita Mail on: 01/14/2023 09:06 AM   Modules accepted: Orders

## 2023-01-29 ENCOUNTER — Ambulatory Visit (INDEPENDENT_AMBULATORY_CARE_PROVIDER_SITE_OTHER): Payer: 59

## 2023-01-29 DIAGNOSIS — I428 Other cardiomyopathies: Secondary | ICD-10-CM

## 2023-01-29 LAB — CUP PACEART REMOTE DEVICE CHECK
Battery Remaining Longevity: 162 mo
Battery Remaining Percentage: 100 %
Brady Statistic RV Percent Paced: 0 %
Date Time Interrogation Session: 20240912022200
HighPow Impedance: 68 Ohm
Implantable Lead Connection Status: 753985
Implantable Lead Implant Date: 20230614
Implantable Lead Location: 753860
Implantable Lead Model: 673
Implantable Lead Serial Number: 196637
Implantable Pulse Generator Implant Date: 20230614
Lead Channel Impedance Value: 451 Ohm
Lead Channel Setting Pacing Amplitude: 2.5 V
Lead Channel Setting Pacing Pulse Width: 0.4 ms
Lead Channel Setting Sensing Sensitivity: 0.5 mV
Pulse Gen Serial Number: 287113
Zone Setting Status: 755011

## 2023-02-07 ENCOUNTER — Other Ambulatory Visit: Payer: Self-pay

## 2023-02-07 ENCOUNTER — Emergency Department
Admission: EM | Admit: 2023-02-07 | Discharge: 2023-02-07 | Discharge status: Against Medical Advice | Payer: 59 | Attending: Emergency Medicine | Admitting: Emergency Medicine

## 2023-02-07 DIAGNOSIS — Z5321 Procedure and treatment not carried out due to patient leaving prior to being seen by health care provider: Secondary | ICD-10-CM | POA: Insufficient documentation

## 2023-02-07 DIAGNOSIS — R1032 Left lower quadrant pain: Secondary | ICD-10-CM | POA: Insufficient documentation

## 2023-02-07 DIAGNOSIS — L989 Disorder of the skin and subcutaneous tissue, unspecified: Secondary | ICD-10-CM | POA: Insufficient documentation

## 2023-02-07 LAB — COMPREHENSIVE METABOLIC PANEL
ALT: 57 U/L — ABNORMAL HIGH (ref 0–44)
AST: 36 U/L (ref 15–41)
Albumin: 4.1 g/dL (ref 3.5–5.0)
Alkaline Phosphatase: 216 U/L — ABNORMAL HIGH (ref 38–126)
Anion gap: 10 (ref 5–15)
BUN: 18 mg/dL (ref 6–20)
CO2: 24 mmol/L (ref 22–32)
Calcium: 9.1 mg/dL (ref 8.9–10.3)
Chloride: 104 mmol/L (ref 98–111)
Creatinine, Ser: 0.51 mg/dL — ABNORMAL LOW (ref 0.61–1.24)
GFR, Estimated: >60 mL/min (ref >60)
Glucose, Bld: 102 mg/dL — ABNORMAL HIGH (ref 70–99)
Potassium: 4.1 mmol/L (ref 3.5–5.1)
Sodium: 138 mmol/L (ref 135–145)
Total Bilirubin: 0.8 mg/dL (ref 0.3–1.2)
Total Protein: 7.1 g/dL (ref 6.5–8.1)

## 2023-02-07 LAB — URINALYSIS, ROUTINE W REFLEX MICROSCOPIC
Bilirubin Urine: NEGATIVE
Glucose, UA: NEGATIVE mg/dL
Hgb urine dipstick: NEGATIVE
Ketones, ur: NEGATIVE mg/dL
Leukocytes,Ua: NEGATIVE
Nitrite: NEGATIVE
Protein, ur: NEGATIVE mg/dL
Specific Gravity, Urine: 1.024 (ref 1.005–1.030)
pH: 5 (ref 5.0–8.0)

## 2023-02-07 LAB — LIPASE, BLOOD: Lipase: 28 U/L (ref 11–51)

## 2023-02-07 LAB — CBC
HCT: 37.7 % — ABNORMAL LOW (ref 39.0–52.0)
Hemoglobin: 11.8 g/dL — ABNORMAL LOW (ref 13.0–17.0)
MCH: 24 pg — ABNORMAL LOW (ref 26.0–34.0)
MCHC: 31.3 g/dL (ref 30.0–36.0)
MCV: 76.8 fL — ABNORMAL LOW (ref 80.0–100.0)
Platelets: 244 10*3/uL (ref 150–400)
RBC: 4.91 MIL/uL (ref 4.22–5.81)
RDW: 14.1 % (ref 11.5–15.5)
WBC: 4.8 10*3/uL (ref 4.0–10.5)
nRBC: 0 % (ref 0.0–0.2)

## 2023-02-07 NOTE — ED Triage Notes (Signed)
Pt reports intermittent LLQ abdominal pain for the last two months. Pt reports having "intense" pain tonight prior to arrival and reports some soreness now. Pt denies n/v/d.

## 2023-02-07 NOTE — ED Notes (Signed)
Pt called 3x for room. Not visualized in lobby

## 2023-02-09 NOTE — Progress Notes (Signed)
Remote ICD transmission.   

## 2023-02-17 ENCOUNTER — Ambulatory Visit: Payer: 59 | Admitting: Internal Medicine

## 2023-02-23 ENCOUNTER — Encounter: Payer: Self-pay | Admitting: Gastroenterology

## 2023-02-23 ENCOUNTER — Ambulatory Visit (INDEPENDENT_AMBULATORY_CARE_PROVIDER_SITE_OTHER): Payer: Self-pay | Admitting: Gastroenterology

## 2023-02-23 ENCOUNTER — Other Ambulatory Visit: Payer: Self-pay

## 2023-02-23 VITALS — BP 124/69 | HR 92 | Temp 97.7°F | Ht 73.0 in | Wt 158.2 lb

## 2023-02-23 DIAGNOSIS — R634 Abnormal weight loss: Secondary | ICD-10-CM | POA: Diagnosis not present

## 2023-02-23 DIAGNOSIS — R748 Abnormal levels of other serum enzymes: Secondary | ICD-10-CM

## 2023-02-23 DIAGNOSIS — Z1211 Encounter for screening for malignant neoplasm of colon: Secondary | ICD-10-CM

## 2023-02-23 MED ORDER — NA SULFATE-K SULFATE-MG SULF 17.5-3.13-1.6 GM/177ML PO SOLN: 354.0000 mL | Freq: Once | ORAL | 0 refills | Status: AC

## 2023-02-23 NOTE — Progress Notes (Signed)
Wyline Mood MD, MRCP(U.K) [address]  [address]  [place], [place] [address]  Main: [phone]  Fax: [phone]   Gastroenterology Consultation  Referring Provider:     Margarita Mail, DO Primary Care Physician:  Margarita Mail, DO Primary Gastroenterologist:  Dr. Wyline Mood  Reason for Consultation:   Left upper quadrant abdominal pain elevated alkaline phosphatase        HPI:   Blake Moore is a 52 y.o. y/o male referred for consultation & management  by Dr. Margarita Mail, DO.     10/25/2021 CT scan abdomen pelvis with contrast showed no focal liver abnormality marked severe degenerative changes in the lower lumbar spine at level of L5-S1 02/07/2023 alkaline phosphatase 216 was normal 1 year prior ALT 57 AST of 36 iron studies normal hemoglobin 12 g MCV 76 Cologuard in May normal GGT normal He says that when he was referred to see as he had some left upper quadrant pain but he realized that it may be related to his heart medication and change the order that he was taking his ARB and carvedilol and since then he has had absolutely no abdominal pain.  He however does say that he has lost over 10 to 15 pounds recently denies any night sweats, change in bowel habits, difficulties with swallowing.  No other complaints.  He never had a colonoscopy.  No NSAID use Past Medical History:  Diagnosis Date   Heart attack [facility])     Past Surgical History:  Procedure Laterality Date   ICD IMPLANT N/A 10/30/2021   Procedure: ICD IMPLANT;  Surgeon: Lanier Prude, MD;  Location: [facility] INVASIVE CV LAB;  Service: Cardiovascular;  Laterality: N/A;   LEFT HEART CATH AND CORONARY ANGIOGRAPHY N/A 10/28/2021   Procedure: LEFT HEART CATH AND CORONARY ANGIOGRAPHY;  Surgeon: Iran Ouch, MD;  Location: [facility] INVASIVE CV LAB;  Service: Cardiovascular;  Laterality: N/A;   PACEMAKER IMPLANT      Prior to Admission medications   Medication Sig Start Date End Date Taking?  Authorizing Provider  carvedilol (COREG) 6.25 MG tablet Take 1 tablet (6.25 mg total) by mouth 2 (two) times daily with a meal. 01/09/23   Margarita Mail, DO  fluticasone [facility]) 50 MCG/ACT nasal spray Place 2 sprays into both nostrils daily. 01/09/23   Margarita Mail, DO  losartan (COZAAR) 25 MG tablet Take 1 tablet (25 mg total) by mouth daily. 01/09/23   Margarita Mail, DO  omeprazole (PRILOSEC) 20 MG capsule Take 1 capsule (20 mg total) by mouth daily. 01/09/23   Margarita Mail, DO  rosuvastatin (CRESTOR) 10 MG tablet Take 1 tablet (10 mg total) by mouth daily. 01/09/23   Margarita Mail, DO    Family History  Problem Relation Age of Onset   Diabetes Mother    Hypertension Mother    Cancer Father      Social History   Tobacco Use   Smoking status: Former    Types: Cigarettes   Smokeless tobacco: Never  Vaping Use   Vaping status: Never Used  Substance Use Topics   Alcohol use: Not Currently    Comment: rarely   Drug use: Yes    Types: Marijuana    Allergies as of 02/23/2023   (No Known Allergies)    Review of Systems:    All systems reviewed and negative except where noted in HPI.   Physical Exam:  BP 124/69   Pulse 92   Temp 97.7 F (36.5 C) (Oral)  Ht 6\' 1"  (1.854 m)   Wt 158 lb 3.2 oz (71.8 kg)   BMI 20.87 kg/m  No LMP for male patient. Psych:  Alert and cooperative. Normal mood and affect. General:   Alert,  Well-developed, well-nourished, pleasant and cooperative in NAD Head:  Normocephalic and atraumatic. Eyes:  Sclera clear, no icterus.   Conjunctiva pink. Ears:  Normal auditory acuity. Neurologic:  Alert and oriented x3;  grossly normal neurologically. Psych:  Alert and cooperative. Normal mood and affect.  Imaging Studies: CUP PACEART REMOTE DEVICE CHECK  Result Date: 01/29/2023 Scheduled remote reviewed. Normal device function.  There were 2 longer NSVT arrhythmias detected, longer than 20 beats, sent to the triage per protocol  Next remote 91 days. ML, CVRS   Assessment and Plan:   Blake Moore is a 52 y.o. y/o male has been referred for elevated alkaline phosphatase which was normal 1 year back with normal GGT.  This usually indicates that the elevated alkaline phosphatase is not related to the liver and other sources should be evaluated such as bone since alkaline phosphatase can also be elevated in conditions such as Paget's disease as well as fractures of bone or hyperparathyroidism and other such conditions.  In terms of his weight loss no clear reason for weight loss will need full evaluation.  His abdominal pain has resolved as of date  Plan 1.  Repeat LFTs and GGT if GGT is elevated will pursue full liver workup if GGT is not elevated will need to follow-up Margarita Mail, DO further evaluation.  Below algorithm for evaluation from up-to-date  2.  Abdominal pain has resolved if has recurrence advised to get in touch with [place] he attributes that to his cardiac medications which she has changed the order in which she takes it and says it has helped  3.  Unintentional weight loss over 10 to 15 pounds we will proceed with EGD and colonoscopy to evaluate further .   I have discussed alternative options, risks & benefits,  which include, but are not limited to, bleeding, infection, perforation,respiratory complication & drug reaction.  The patient agrees with this plan & written consent will be obtained.     Follow up in 8 to 12 weeks with Celso Amy  Dr Wyline Mood MD,MRCP(U.K)

## 2023-02-24 MED ORDER — NA SULFATE-K SULFATE-MG SULF 17.5-3.13-1.6 GM/177ML PO SOLN: 354.0000 mL | Freq: Once | ORAL | 0 refills | Status: AC

## 2023-02-24 NOTE — Addendum Note (Signed)
Addended by: Adela Ports on: 02/24/2023 12:41 PM   Modules accepted: Orders

## 2023-03-01 NOTE — Progress Notes (Unsigned)
  Electrophysiology Office Follow up Visit Note:    Date:  03/02/2023   ID:  Jeanine Luz, DOB 18-Feb-1971, MRN [id number]  PCP:  Margarita Mail, DO  [facility] Cardiologist:  Lorine Bears, MD  [facility] Electrophysiologist:  Lanier Prude, MD    Interval History:    Stephanos Fan is a 52 y.o. male who presents for a follow up visit.   Last seen 02/05/2022 for a history of VF with ICD in situ.  He has been doing well.  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  He has not been taking Crestor or losartan because of GI discomfort.  His GI discomfort completely resolved when he stopped these medications.      Past medical, surgical, social and family history were reviewed.  ROS:   Please see the history of present illness.    All other systems reviewed and are negative.  EKGs/Labs/Other Studies Reviewed:    The following studies were reviewed today:  03/02/2023 in clinic device interrogation personally reviewed. No high-voltage therapies Battery longevity 13.2 years Several nonsustained ventricular tachycardia episodes, most recently February 09 2480 bpm Ventricular pacing less than 1% Histograms appropriate        Physical Exam:    VS:  BP 116/62   Pulse 90   Ht 6\' 1"  (1.854 m)   Wt 158 lb (71.7 kg)   SpO2 97%   BMI 20.85 kg/m     Wt Readings from Last 3 Encounters:  03/02/23 158 lb (71.7 kg)  02/23/23 158 lb 3.2 oz (71.8 kg)  02/07/23 169 lb (76.7 kg)     GEN:  Well nourished, well developed in no acute distress CARDIAC: RRR, no murmurs, rubs, gallops.  ICD pocket well-healed RESPIRATORY:  Clear to auscultation without rales, wheezing or rhonchi       ASSESSMENT:    1. Nonischemic cardiomyopathy (HCC)   2. Cardiac arrest with ventricular fibrillation (HCC)   3. ICD (implantable cardioverter-defibrillator) in place    PLAN:    In order of problems listed above:  #VF arrest #ICD in situ #HVR episodes No HV therapies Device  functioning appropriately, continue remote monitoring   #Chronic systolic heart failure #nonischemic cardiomyopathy NYHA I today. Mildly reduced EF on 10/2021 cMR. Continue coreg. Did not tolerate losartan due to abdominal pain   F/u 1 year with APP    Signed, Steffanie Dunn, MD, [facility], [facility] 03/02/2023 4:41 PM    Electrophysiology [facility]

## 2023-03-02 ENCOUNTER — Encounter: Payer: Self-pay | Admitting: Cardiology

## 2023-03-02 ENCOUNTER — Ambulatory Visit: Payer: 59 | Attending: Cardiology | Admitting: Cardiology

## 2023-03-02 VITALS — BP 116/62 | HR 90 | Ht 73.0 in | Wt 158.0 lb

## 2023-03-02 DIAGNOSIS — Z95810 Presence of automatic (implantable) cardiac defibrillator: Secondary | ICD-10-CM | POA: Diagnosis not present

## 2023-03-02 DIAGNOSIS — I4901 Ventricular fibrillation: Secondary | ICD-10-CM

## 2023-03-02 DIAGNOSIS — I428 Other cardiomyopathies: Secondary | ICD-10-CM

## 2023-03-02 DIAGNOSIS — I469 Cardiac arrest, cause unspecified: Secondary | ICD-10-CM

## 2023-03-02 NOTE — Patient Instructions (Signed)
Medication Instructions:  Your physician recommends that you continue on your current medications as directed. Please refer to the Current Medication list given to you today.  *If you need a refill on your cardiac medications before your next appointment, please call your pharmacy*   Follow-Up: At [facility], you and your health needs are our priority.  As part of our continuing mission to provide you with exceptional heart care, we have created designated Provider Care Teams.  These Care Teams include your primary Cardiologist (physician) and Advanced Practice Providers (APPs -  Physician Assistants and Nurse Practitioners) who all work together to provide you with the care you need, when you need it.   Your next appointment:   1 year  Provider:   You may see Lanier Prude, MD or one of the following Advanced Practice Providers on your designated Care Team:   Francis Dowse, [place] [address] [place], [place] Sherie Don, NP Canary Brim, NP

## 2023-03-04 ENCOUNTER — Ambulatory Visit: Payer: 59 | Admitting: Internal Medicine

## 2023-03-08 LAB — CUP PACEART INCLINIC DEVICE CHECK
Date Time Interrogation Session: 20241014000000
HighPow Impedance: 68 Ohm
Implantable Lead Connection Status: 753985
Implantable Lead Implant Date: 20230614
Implantable Lead Location: 753860
Implantable Lead Model: 673
Implantable Lead Serial Number: 196637
Implantable Pulse Generator Implant Date: 20230614
Lead Channel Impedance Value: 459 Ohm
Lead Channel Pacing Threshold Amplitude: 0.6 V
Lead Channel Pacing Threshold Pulse Width: 0.4 ms
Lead Channel Sensing Intrinsic Amplitude: 25 mV
Lead Channel Setting Pacing Amplitude: 2.5 V
Lead Channel Setting Pacing Pulse Width: 0.4 ms
Lead Channel Setting Sensing Sensitivity: 0.5 mV
Pulse Gen Serial Number: 287113
Zone Setting Status: 755011

## 2023-03-10 ENCOUNTER — Telehealth: Payer: Self-pay | Admitting: Internal Medicine

## 2023-03-10 NOTE — Telephone Encounter (Signed)
Pt is calling in because per pt his abdominal doctor says he needed a colonoscopy. Pt is scheduled for a colonoscopy on 03/31/23. Pt received a text from [facility] regarding the colonoscopy and pt wants to know if the colonoscopy is pre certified. Please follow up with pt.

## 2023-03-10 NOTE — Telephone Encounter (Signed)
Pt notified, to call the office that is doing procedure.  Phone # given to [facility]

## 2023-03-11 ENCOUNTER — Telehealth: Payer: Self-pay | Admitting: Gastroenterology

## 2023-03-11 NOTE — Telephone Encounter (Signed)
The patient called in wanting to know is his procedure pre-certified.

## 2023-03-12 NOTE — Telephone Encounter (Signed)
Called patient back but had to leave him a detailed message letting him know that according to his insurance, it did not require prior-authorization. I let him know that if he had further questions that he could call Roxan Diesel. [phone]) to ask her questions if necessary since she is the correct person to ask. If he had additional questions, to please call us back.

## 2023-03-31 ENCOUNTER — Encounter: Payer: Self-pay | Admitting: Gastroenterology

## 2023-03-31 ENCOUNTER — Ambulatory Visit: Payer: 59 | Admitting: Certified Registered"

## 2023-03-31 ENCOUNTER — Other Ambulatory Visit: Payer: Self-pay

## 2023-03-31 ENCOUNTER — Ambulatory Visit
Admission: RE | Admit: 2023-03-31 | Discharge: 2023-03-31 | Disposition: A | Discharge status: Home / Self Care | Payer: 59 | Attending: Gastroenterology | Admitting: Gastroenterology

## 2023-03-31 ENCOUNTER — Encounter
Admission: RE | Disposition: A | Discharge status: Home / Self Care | Payer: Self-pay | Source: Home / Self Care | Attending: Gastroenterology

## 2023-03-31 DIAGNOSIS — Z87891 Personal history of nicotine dependence: Secondary | ICD-10-CM | POA: Diagnosis not present

## 2023-03-31 DIAGNOSIS — Z1211 Encounter for screening for malignant neoplasm of colon: Secondary | ICD-10-CM | POA: Diagnosis not present

## 2023-03-31 DIAGNOSIS — F1290 Cannabis use, unspecified, uncomplicated: Secondary | ICD-10-CM | POA: Diagnosis not present

## 2023-03-31 DIAGNOSIS — I499 Cardiac arrhythmia, unspecified: Secondary | ICD-10-CM | POA: Diagnosis not present

## 2023-03-31 DIAGNOSIS — R634 Abnormal weight loss: Secondary | ICD-10-CM | POA: Diagnosis not present

## 2023-03-31 DIAGNOSIS — R748 Abnormal levels of other serum enzymes: Secondary | ICD-10-CM | POA: Diagnosis not present

## 2023-03-31 HISTORY — PX: ESOPHAGOGASTRODUODENOSCOPY (EGD) WITH PROPOFOL: SHX5813

## 2023-03-31 HISTORY — PX: COLONOSCOPY WITH PROPOFOL: SHX5780

## 2023-03-31 SURGERY — COLONOSCOPY WITH PROPOFOL
Anesthesia: General

## 2023-03-31 MED ORDER — SODIUM CHLORIDE 0.9 % IV SOLN: INTRAVENOUS | Status: DC | PRN

## 2023-03-31 MED ORDER — PROPOFOL 500 MG/50ML IV EMUL
INTRAVENOUS | Status: DC | PRN
  Administered 2023-03-31: 125 ug/kg/min via INTRAVENOUS

## 2023-03-31 MED ORDER — PROPOFOL 10 MG/ML IV BOLUS
INTRAVENOUS | Status: DC | PRN
  Administered 2023-03-31: 40 mg via INTRAVENOUS
  Administered 2023-03-31: 100 mg via INTRAVENOUS

## 2023-03-31 MED ORDER — LIDOCAINE HCL (CARDIAC) PF 100 MG/5ML IV SOSY
PREFILLED_SYRINGE | INTRAVENOUS | Status: DC | PRN
  Administered 2023-03-31: 80 mg via INTRAVENOUS

## 2023-03-31 NOTE — Op Note (Signed)
[facility] Gastroenterology Patient Name: Blake Moore Procedure Date: 03/31/2023 9:57 AM MRN: [id number] Account #: [id number] Date of Birth: [birth year] Admit Type: Outpatient Age: 52 Room: [facility] ENDO ROOM 2 Gender: Male Note Status: Finalized Instrument Name: Upper Endoscope [id number] Procedure:             Upper GI endoscopy Indications:           Weight loss Providers:             Wyline Mood MD, MD Referring MD:          Margarita Mail (Referring MD) Medicines:             Monitored Anesthesia Care Complications:         No immediate complications. Procedure:             Pre-Anesthesia Assessment:                        - Prior to the procedure, a History and Physical was                         performed, and patient medications, allergies and                         sensitivities were reviewed. The patient's tolerance                         of previous anesthesia was reviewed.                        - The risks and benefits of the procedure and the                         sedation options and risks were discussed with the                         patient. All questions were answered and informed                         consent was obtained.                        - ASA Grade Assessment: II - A patient with mild                         systemic disease.                        After obtaining informed consent, the endoscope was                         passed under direct vision. Throughout the procedure,                         the patient's blood pressure, pulse, and oxygen                         saturations were monitored continuously. The Endoscope                         was introduced through  the mouth, and advanced to the                         third part of duodenum. The upper GI endoscopy was                         accomplished with ease. The patient tolerated the                         procedure well. Findings:      The esophagus was  normal.      The stomach was normal.      The examined duodenum was normal. Impression:            - Normal esophagus.                        - Normal stomach.                        - Normal examined duodenum.                        - No specimens collected. Recommendation:        - Perform a colonoscopy today. Procedure Code(s):     --- Professional ---                        [phone], Esophagogastroduodenoscopy, flexible,                         transoral; diagnostic, including collection of                         specimen(s) by brushing or washing, when performed                         (separate procedure) Diagnosis Code(s):     --- Professional ---                        R63.4, Abnormal weight loss CPT copyright 2022 American Medical Association. All rights reserved. The codes documented in this report are preliminary and upon coder review may  be revised to meet current compliance requirements. Wyline Mood, MD Wyline Mood MD, MD 03/31/2023 10:11:33 AM This report has been signed electronically. Number of Addenda: 0 Note Initiated On: 03/31/2023 9:57 AM Estimated Blood Loss:  Estimated blood loss: none.      [facility]

## 2023-03-31 NOTE — Anesthesia Preprocedure Evaluation (Signed)
Anesthesia Evaluation  Patient identified by MRN, date of birth, ID band Patient awake    Reviewed: Allergy & Precautions, NPO status , Patient's Chart, lab work & pertinent test results  History of Anesthesia Complications Negative for: history of anesthetic complications  Airway Mallampati: II  TM Distance: >3 FB Neck ROM: full    Dental no notable dental hx.    Pulmonary neg pulmonary ROS, Patient abstained from smoking., former smoker   Pulmonary exam normal        Cardiovascular + Past MI  + dysrhythmias Ventricular Fibrillation + Cardiac Defibrillator   Echo 2023      ECHOCARDIOGRAM REPORT       Patient Name:   Blake Moore Date of Exam: 10/25/2021 Medical Rec #:  [id number]     Height:       73.0 in Accession #:    [id number]    Weight:       187.4 lb Date of Birth:  [birth year]     BSA:          2.093 m Patient Age:    52 years      BP:           101/67 mmHg Patient Gender: M             HR:           81 bpm. Exam Location:  [facility]  Procedure: 2D Echo, Color Doppler and Cardiac Doppler  Indications:     I46.9 Cardiac arrest   History:         Patient has no prior history of Echocardiogram examinations.                  Signs/Symptoms:Chest Pain.   Sonographer:     Humphrey Rolls Referring Phys:  [id number] Lorin Glass Diagnosing Phys: Julien Nordmann MD    Sonographer Comments: Echo performed with patient supine and on artificial respirator. IMPRESSIONS    1. Left ventricular ejection fraction, by estimation, is 45 to 50%. The left ventricle has mildly decreased function. The left ventricle demonstrates global hypokinesis. Left ventricular diastolic parameters were normal.  2. Right ventricular systolic function is low normal. The right ventricular size is normal. Tricuspid regurgitation signal is inadequate for assessing PA pressure.  3. The mitral valve is normal in structure. Trivial mitral  valve regurgitation. No evidence of mitral stenosis.  4. The aortic valve is tricuspid. Aortic valve regurgitation is not visualized. No aortic stenosis is present.  5. The inferior vena cava is dilated in size with >50% respiratory variability, suggesting right atrial pressure of 8 mmHg.     Neuro/Psych negative neurological ROS  negative psych ROS   GI/Hepatic negative GI ROS, Neg liver ROS,,,  Endo/Other  negative endocrine ROS    Renal/GU negative Renal ROS  negative genitourinary   Musculoskeletal   Abdominal   Peds  Hematology negative hematology ROS (+)   Anesthesia Other Findings Past Medical History: No date: Heart attack [facility])  Past Surgical History: 10/30/2021: ICD IMPLANT; N/A     Comment:  Procedure: ICD IMPLANT;  Surgeon: Lanier Prude,               MD;  Location: [facility] INVASIVE CV LAB;  Service:               Cardiovascular;  Laterality: N/A; 10/28/2021: LEFT HEART CATH AND CORONARY ANGIOGRAPHY; N/A     Comment:  Procedure: LEFT HEART CATH AND CORONARY ANGIOGRAPHY;  Surgeon: Iran Ouch, MD;  Location: [facility] INVASIVE               CV LAB;  Service: Cardiovascular;  Laterality: N/A; No date: PACEMAKER IMPLANT  BMI    Body Mass Index: 19.97 kg/m      Reproductive/Obstetrics negative OB ROS                             Anesthesia Physical Anesthesia Plan  ASA: 3  Anesthesia Plan: General   Post-op Pain Management: Minimal or no pain anticipated   Induction: Intravenous  PONV Risk Score and Plan: 1 and Propofol infusion and TIVA  Airway Management Planned: Natural Airway and Nasal Cannula  Additional Equipment:   Intra-op Plan:   Post-operative Plan:   Informed Consent: I have reviewed the patients History and Physical, chart, labs and discussed the procedure including the risks, benefits and alternatives for the proposed anesthesia with the patient or authorized representative who has  indicated his/her understanding and acceptance.     Dental Advisory Given  Plan Discussed with: Anesthesiologist, CRNA and Surgeon  Anesthesia Plan Comments: (Patient consented for risks of anesthesia including but not limited to:  - adverse reactions to medications - risk of airway placement if required - damage to eyes, teeth, lips or other oral mucosa - nerve damage due to positioning  - sore throat or hoarseness - Damage to heart, brain, nerves, lungs, other parts of body or loss of life  Patient voiced understanding and assent.)       Anesthesia Quick Evaluation

## 2023-03-31 NOTE — Op Note (Signed)
[facility] - Inpatient Gastroenterology Patient Name: Blake Moore Procedure Date: 03/31/2023 9:56 AM MRN: [id number] Account #: [id number] Date of Birth: 1970-09-07 Admit Type: Outpatient Age: 51 Room: [facility] ENDO ROOM 2 Gender: Male Note Status: Finalized Instrument Name: Prentice Docker [id number] Procedure:             Colonoscopy Indications:           Weight loss Providers:             Wyline Mood MD, MD Referring MD:          Margarita Mail (Referring MD) Medicines:             Monitored Anesthesia Care Complications:         No immediate complications. Procedure:             Pre-Anesthesia Assessment:                        - Prior to the procedure, a History and Physical was                         performed, and patient medications, allergies and                         sensitivities were reviewed. The patient's tolerance                         of previous anesthesia was reviewed.                        - The risks and benefits of the procedure and the                         sedation options and risks were discussed with the                         patient. All questions were answered and informed                         consent was obtained.                        - ASA Grade Assessment: II - A patient with mild                         systemic disease.                        After obtaining informed consent, the colonoscope was                         passed under direct vision. Throughout the procedure,                         the patient's blood pressure, pulse, and oxygen                         saturations were monitored continuously. The                         Colonoscope was introduced through the anus with  the                         intention of advancing to the cecum. The scope was                         advanced to the sigmoid colon before the procedure was                         aborted. Medications were given. The colonoscopy was                          performed with ease. The patient tolerated the                         procedure well. The quality of the bowel preparation                         was unsatisfactory. Findings:      A large amount of solid stool was found in the rectum and in the sigmoid       colon, interfering with visualization. Impression:            - Preparation of the colon was unsatisfactory.                        - Stool in the rectum and in the sigmoid colon.                        - No specimens collected. Recommendation:        - Discharge patient to home (with escort).                        - Resume previous diet.                        - Continue present medications.                        - Repeat colonoscopy in 2 weeks because the bowel                         preparation was suboptimal. Procedure Code(s):     --- Professional ---                        [phone], 53, Colonoscopy, flexible; diagnostic,                         including collection of specimen(s) by brushing or                         washing, when performed (separate procedure) Diagnosis Code(s):     --- Professional ---                        R63.4, Abnormal weight loss CPT copyright 2022 American Medical Association. All rights reserved. The codes documented in this report are preliminary and upon coder review may  be revised to meet current compliance requirements. Wyline Mood, MD Wyline Mood MD, MD 03/31/2023 10:15:35 AM This  report has been signed electronically. Number of Addenda: 0 Note Initiated On: 03/31/2023 9:56 AM Total Procedure Duration: 0 hours 0 minutes 33 seconds  Estimated Blood Loss:  Estimated blood loss: none.      [facility]

## 2023-03-31 NOTE — H&P (Signed)
Wyline Mood, MD [address], [address], [place], [place], [address] [address], [address], [place], [place], [address] Phone: [phone]  Fax: [phone]  Primary Care Physician:  Margarita Mail, DO   Pre-Procedure History & Physical: HPI:  Blake Moore is a 52 y.o. male is here for an endoscopy and colonoscopy    Past Medical History:  Diagnosis Date   Heart attack [facility])     Past Surgical History:  Procedure Laterality Date   ICD IMPLANT N/A 10/30/2021   Procedure: ICD IMPLANT;  Surgeon: Lanier Prude, MD;  Location: [facility] INVASIVE CV LAB;  Service: Cardiovascular;  Laterality: N/A;   LEFT HEART CATH AND CORONARY ANGIOGRAPHY N/A 10/28/2021   Procedure: LEFT HEART CATH AND CORONARY ANGIOGRAPHY;  Surgeon: Iran Ouch, MD;  Location: [facility] INVASIVE CV LAB;  Service: Cardiovascular;  Laterality: N/A;   PACEMAKER IMPLANT      Prior to Admission medications   Medication Sig Start Date End Date Taking? Authorizing Provider  carvedilol (COREG) 6.25 MG tablet Take 1 tablet (6.25 mg total) by mouth 2 (two) times daily with a meal. Patient not taking: Reported on 02/23/2023 01/09/23   Margarita Mail, DO  fluticasone [facility]) 50 MCG/ACT nasal spray Place 2 sprays into both nostrils daily. Patient not taking: Reported on 03/02/2023 01/09/23   Margarita Mail, DO  Na Sulfate-K Sulfate-Mg Sulf 17.5-3.13-1.6 GM/177ML SOLN Take by mouth as directed. colonoscopy 02/25/23   [provider]  omeprazole (PRILOSEC) 20 MG capsule Take 1 capsule (20 mg total) by mouth daily. 01/09/23   Margarita Mail, DO    Allergies as of 02/23/2023   (No Known Allergies)    Family History  Problem Relation Age of Onset   Diabetes Mother    Hypertension Mother    Cancer Father     Social History   Socioeconomic History   Marital status: Single    Spouse name: Not on file   Number of children: Not on file   Years of education: Not on file   Highest education  level: Not on file  Occupational History   Not on file  Tobacco Use   Smoking status: Former    Types: Cigarettes   Smokeless tobacco: Never  Vaping Use   Vaping status: Never Used  Substance and Sexual Activity   Alcohol use: Not Currently    Comment: rarely   Drug use: Yes    Types: Marijuana   Sexual activity: Yes  Other Topics Concern   Not on file  Social History Narrative   Not on file   Social Determinants of Health   Financial Resource Strain: Not on file  Food Insecurity: Not on file  Transportation Needs: Not on file  Physical Activity: Not on file  Stress: Not on file  Social Connections: Not on file  Intimate Partner Violence: Not on file    Review of Systems: See HPI, otherwise negative ROS  Physical Exam: There were no vitals taken for this visit. General:   Alert,  pleasant and cooperative in NAD Head:  Normocephalic and atraumatic. Neck:  Supple; no masses or thyromegaly. Lungs:  Clear throughout to auscultation, normal respiratory effort.    Heart:  +S1, +S2, Regular rate and rhythm, No edema. Abdomen:  Soft, nontender and nondistended. Normal bowel sounds, without guarding, and without rebound.   Neurologic:  Alert and  oriented x4;  grossly normal neurologically.  Impression/Plan: Blake Moore is here for an endoscopy and colonoscopy  to be performed  for  evaluation of weight loss    Risks, benefits, limitations, and alternatives regarding endoscopy have been reviewed with the patient.  Questions have been answered.  All parties agreeable.   Wyline Mood, MD  03/31/2023, 9:17 AM

## 2023-03-31 NOTE — Anesthesia Postprocedure Evaluation (Signed)
Anesthesia Post Note  Patient: Blake Moore  Procedure(s) Performed: COLONOSCOPY WITH PROPOFOL ESOPHAGOGASTRODUODENOSCOPY (EGD) WITH PROPOFOL  Patient location during evaluation: Endoscopy Anesthesia Type: General Level of consciousness: awake and alert Pain management: pain level controlled Vital Signs Assessment: post-procedure vital signs reviewed and stable Respiratory status: spontaneous breathing, nonlabored ventilation, respiratory function stable and patient connected to nasal cannula oxygen Cardiovascular status: blood pressure returned to baseline and stable Postop Assessment: no apparent nausea or vomiting Anesthetic complications: no   No notable events documented.   Last Vitals:  Vitals:   03/31/23 1020 03/31/23 1040  BP: 138/67 (!) 115/99  Pulse: (!) 116   Resp: 20   Temp: 36.6 C   SpO2: 98%     Last Pain:  Vitals:   03/31/23 1040  TempSrc:   PainSc: 0-No pain                 Louie Boston

## 2023-03-31 NOTE — Transfer of Care (Signed)
Immediate Anesthesia Transfer of Care Note  Patient: Blake Moore  Procedure(s) Performed: COLONOSCOPY WITH PROPOFOL ESOPHAGOGASTRODUODENOSCOPY (EGD) WITH PROPOFOL  Patient Location: PACU  Anesthesia Type:General  Level of Consciousness: awake and alert   Airway & Oxygen Therapy: Patient Spontanous Breathing  Post-op Assessment: Report given to RN and Post -op Vital signs reviewed and stable  Post vital signs: Reviewed and stable  Last Vitals:  Vitals Value Taken Time  BP 138/67 03/31/23 1021  Temp 36.6 C 03/31/23 1020  Pulse 111 03/31/23 1021  Resp 23 03/31/23 1021  SpO2 99 % 03/31/23 1021  Vitals shown include unfiled device data.  Last Pain:  Vitals:   03/31/23 1020  TempSrc:   PainSc: 0-No pain         Complications: No notable events documented.

## 2023-04-01 ENCOUNTER — Encounter: Payer: Self-pay | Admitting: Gastroenterology

## 2023-04-14 ENCOUNTER — Other Ambulatory Visit: Payer: Self-pay

## 2023-04-14 ENCOUNTER — Telehealth: Payer: Self-pay

## 2023-04-14 DIAGNOSIS — Z1211 Encounter for screening for malignant neoplasm of colon: Secondary | ICD-10-CM

## 2023-04-14 DIAGNOSIS — R634 Abnormal weight loss: Secondary | ICD-10-CM

## 2023-04-14 DIAGNOSIS — R748 Abnormal levels of other serum enzymes: Secondary | ICD-10-CM

## 2023-04-14 MED ORDER — GOLYTELY 236 G PO SOLR: 4000.0000 mL | Freq: Once | ORAL | 0 refills | Status: AC

## 2023-04-14 NOTE — Telephone Encounter (Signed)
Patient had colonoscopy on 03/31/2023 and was told to repeat colonoscopy in 2 weeks due to not being cleaned out. 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 Patient states he can take off the first of the year though. Went over instructions, mailed them and sent to [employer]. Sent prep to the pharmacy

## 2023-04-30 ENCOUNTER — Ambulatory Visit (INDEPENDENT_AMBULATORY_CARE_PROVIDER_SITE_OTHER): Payer: 59

## 2023-04-30 DIAGNOSIS — I428 Other cardiomyopathies: Secondary | ICD-10-CM

## 2023-04-30 LAB — CUP PACEART REMOTE DEVICE CHECK
Battery Remaining Longevity: 150 mo
Battery Remaining Percentage: 100 %
Brady Statistic RV Percent Paced: 0 %
Date Time Interrogation Session: 20241212030100
HighPow Impedance: 63 Ohm
Implantable Lead Connection Status: 753985
Implantable Lead Implant Date: 20230614
Implantable Lead Location: 753860
Implantable Lead Model: 673
Implantable Lead Serial Number: 196637
Implantable Pulse Generator Implant Date: 20230614
Lead Channel Impedance Value: 411 Ohm
Lead Channel Setting Pacing Amplitude: 2.5 V
Lead Channel Setting Pacing Pulse Width: 0.4 ms
Lead Channel Setting Sensing Sensitivity: 0.5 mV
Pulse Gen Serial Number: 287113
Zone Setting Status: 755011

## 2023-06-03 ENCOUNTER — Ambulatory Visit: Admission: RE | Admit: 2023-06-03 | Payer: 59 | Source: Home / Self Care | Admitting: Gastroenterology

## 2023-06-03 SURGERY — COLONOSCOPY WITH PROPOFOL
Anesthesia: General

## 2023-06-04 NOTE — Progress Notes (Signed)
Remote ICD transmission.   

## 2023-06-19 ENCOUNTER — Ambulatory Visit: Payer: 59 | Admitting: Internal Medicine

## 2023-06-22 ENCOUNTER — Encounter: Payer: Self-pay | Admitting: Internal Medicine

## 2023-06-22 ENCOUNTER — Other Ambulatory Visit: Payer: Self-pay

## 2023-06-22 ENCOUNTER — Ambulatory Visit: Payer: 59 | Admitting: Internal Medicine

## 2023-06-22 VITALS — BP 120/60 | HR 98 | Temp 98.2°F | Resp 18 | Ht 73.0 in | Wt 146.2 lb

## 2023-06-22 DIAGNOSIS — R634 Abnormal weight loss: Secondary | ICD-10-CM | POA: Diagnosis not present

## 2023-06-22 DIAGNOSIS — R7989 Other specified abnormal findings of blood chemistry: Secondary | ICD-10-CM

## 2023-06-22 DIAGNOSIS — Z122 Encounter for screening for malignant neoplasm of respiratory organs: Secondary | ICD-10-CM

## 2023-06-22 DIAGNOSIS — Z125 Encounter for screening for malignant neoplasm of prostate: Secondary | ICD-10-CM

## 2023-06-22 DIAGNOSIS — R0981 Nasal congestion: Secondary | ICD-10-CM

## 2023-06-22 DIAGNOSIS — E0590 Thyrotoxicosis, unspecified without thyrotoxic crisis or storm: Secondary | ICD-10-CM | POA: Diagnosis not present

## 2023-06-22 MED ORDER — FLUTICASONE PROPIONATE 50 MCG/ACT NA SUSP: 2.0000 | Freq: Every day | NASAL | 6 refills | Status: DC

## 2023-06-22 NOTE — Progress Notes (Signed)
Established Patient Office Visit  Subjective   Patient ID: Blake Moore, male    DOB: [birth year]  Age: 53 y.o. MRN: [id number]  Chief Complaint  Patient presents with   Weight Loss   Dizziness    HPI  [name] presents to discussed unintentional weight loss and dizziness. Patient has been losing 3 to 5 pounds a month unintentionally.  He does work at [employer] and tries to eat 3 meals a day but sometimes he is too busy.  He does feel like he finishes his meals but has a decreased appetite overall.  He did see GI for his ongoing abdominal pain which I guess had resolved by the time he was seen.  He did have EGD and colonoscopy planned, however due to inappropriate prep this cannot be completed.  Now complaining of dizziness and presyncope, worse with changing positions.  Patient is currently not taking Coreg or losartan.  History of cardiac arrest with ventricular fibrillation/NICM/CHF: -Has ICD implant since 6/23 -Cardiac MRI 6/23 EF 50% with global hypokinesis -Supposed to be on Coreg 6.25 mg twice daily, losartan 12.5 mg but not taking either of these currently  -Following with Cardiology, last seen 03/02/23  HLD/history of MI: -Medications: Crestor 10 mg -Patient is compliant with above medications and reports no side effects.  -Last lipid panel: Lipid Panel     Component Value Date/Time   CHOL 130 08/18/2022 1520   TRIG 80 08/18/2022 1520   HDL 46 08/18/2022 1520   CHOLHDL 2.8 08/18/2022 1520   VLDL 8 10/25/2021 1115   LDLCALC 68 08/18/2022 1520   Health Maintenance: -Blood work due -Colon cancer screening; Cologuard negative 5/24 but needing colonoscopy due to unintentional weight loss, anemia. Patient will call to re-schedule.  Patient Active Problem List   Diagnosis Date Noted   Loss of weight 03/31/2023   Nonischemic cardiomyopathy (HCC)    Acute metabolic encephalopathy 10/28/2021   Acute respiratory failure with hypoxia and hypercapnia (HCC)  10/28/2021   Aspiration pneumonia (HCC) 10/28/2021   Hypokalemia 10/28/2021   Cardiac arrest with ventricular fibrillation (HCC) 10/25/2021   Past Medical History:  Diagnosis Date   Heart attack [facility])    Past Surgical History:  Procedure Laterality Date   COLONOSCOPY WITH PROPOFOL N/A 03/31/2023   Procedure: COLONOSCOPY WITH PROPOFOL;  Surgeon: Wyline Mood, MD;  Location: [facility] ENDOSCOPY;  Service: Gastroenterology;  Laterality: N/A;   ESOPHAGOGASTRODUODENOSCOPY (EGD) WITH PROPOFOL N/A 03/31/2023   Procedure: ESOPHAGOGASTRODUODENOSCOPY (EGD) WITH PROPOFOL;  Surgeon: Wyline Mood, MD;  Location: [facility] ENDOSCOPY;  Service: Gastroenterology;  Laterality: N/A;   ICD IMPLANT N/A 10/30/2021   Procedure: ICD IMPLANT;  Surgeon: Lanier Prude, MD;  Location: [facility] INVASIVE CV LAB;  Service: Cardiovascular;  Laterality: N/A;   LEFT HEART CATH AND CORONARY ANGIOGRAPHY N/A 10/28/2021   Procedure: LEFT HEART CATH AND CORONARY ANGIOGRAPHY;  Surgeon: Iran Ouch, MD;  Location: [facility] INVASIVE CV LAB;  Service: Cardiovascular;  Laterality: N/A;   PACEMAKER IMPLANT     Social History   Tobacco Use   Smoking status: Former    Types: Cigarettes   Smokeless tobacco: Never  Vaping Use   Vaping status: Never Used  Substance Use Topics   Alcohol use: Not Currently    Comment: rarely   Drug use: Yes    Types: Marijuana   Social History   Socioeconomic History   Marital status: Single    Spouse name: Not on file   Number of children: Not on file  Years of education: Not on file   Highest education level: Not on file  Occupational History   Not on file  Tobacco Use   Smoking status: Former    Types: Cigarettes   Smokeless tobacco: Never  Vaping Use   Vaping status: Never Used  Substance and Sexual Activity   Alcohol use: Not Currently    Comment: rarely   Drug use: Yes    Types: Marijuana   Sexual activity: Yes  Other Topics Concern   Not on file  Social History Narrative    Not on file   Social Drivers of Health   Financial Resource Strain: Not on file  Food Insecurity: Not on file  Transportation Needs: Not on file  Physical Activity: Not on file  Stress: Not on file  Social Connections: Not on file  Intimate Partner Violence: Not on file   Family Status  Relation Name Status   Mother  Alive   Father  Deceased   Sister 1 Alive   Daughter 1 Alive   Son 2 Alive  No partnership data on file   Family History  Problem Relation Age of Onset   Diabetes Mother    Hypertension Mother    Cancer Father    No Known Allergies    Review of Systems  Constitutional:  Negative for chills and fever.  HENT:  Positive for congestion. Negative for sinus pain and sore throat.   Eyes:  Negative for blurred vision.  Respiratory:  Positive for shortness of breath.   Cardiovascular:  Negative for chest pain and palpitations.  Neurological:  Positive for dizziness.      Objective:     BP 120/60 (Cuff Size: Normal)   Pulse 98   Temp 98.2 F (36.8 C) (Oral)   Resp 18   Ht 6\' 1"  (1.854 m)   Wt 146 lb 3.2 oz (66.3 kg)   BMI 19.29 kg/m  BP Readings from Last 3 Encounters:  06/22/23 120/60  03/31/23 (!) 115/99  03/02/23 116/62   Wt Readings from Last 3 Encounters:  06/22/23 146 lb 3.2 oz (66.3 kg)  03/31/23 151 lb 6.4 oz (68.7 kg)  03/02/23 158 lb (71.7 kg)      Physical Exam Constitutional:      Appearance: Normal appearance.  HENT:     Head: Normocephalic and atraumatic.     Right Ear: Ear canal and external ear normal.     Left Ear: Ear canal and external ear normal.     Ears:     Comments: Bilateral eustachian tube dysfunction present    Nose: Congestion and rhinorrhea present.     Mouth/Throat:     Mouth: Mucous membranes are moist.     Pharynx: Oropharynx is clear.  Eyes:     Extraocular Movements: Extraocular movements intact.     Conjunctiva/sclera: Conjunctivae normal.     Pupils: Pupils are equal, round, and reactive to light.   Neck:     Comments: No thyromegaly Cardiovascular:     Rate and Rhythm: Normal rate and regular rhythm.  Pulmonary:     Effort: Pulmonary effort is normal.     Breath sounds: Normal breath sounds.  Musculoskeletal:     Cervical back: No tenderness.  Lymphadenopathy:     Cervical: No cervical adenopathy.  Skin:    General: Skin is warm and dry.  Neurological:     General: No focal deficit present.     Mental Status: He is alert. Mental status is at  baseline.  Psychiatric:        Mood and Affect: Mood normal.        Behavior: Behavior normal.      No results found for any visits on 06/22/23.  Last CBC Lab Results  Component Value Date   WBC 4.8 02/07/2023   HGB 11.8 (L) 02/07/2023   HCT 37.7 (L) 02/07/2023   MCV 76.8 (L) 02/07/2023   MCH 24.0 (L) 02/07/2023   RDW 14.1 02/07/2023   PLT 244 02/07/2023   Last metabolic panel Lab Results  Component Value Date   GLUCOSE 102 (H) 02/07/2023   NA 138 02/07/2023   K 4.1 02/07/2023   CL 104 02/07/2023   CO2 24 02/07/2023   BUN 18 02/07/2023   CREATININE 0.51 (L) 02/07/2023   GFRNONAA >60 02/07/2023   CALCIUM 9.1 02/07/2023   PHOS 2.9 10/29/2021   PROT 7.1 02/07/2023   ALBUMIN 4.1 02/07/2023   BILITOT 0.8 02/07/2023   ALKPHOS 216 (H) 02/07/2023   AST 36 02/07/2023   ALT 57 (H) 02/07/2023   ANIONGAP 10 02/07/2023   Last lipids Lab Results  Component Value Date   CHOL 130 08/18/2022   HDL 46 08/18/2022   LDLCALC 68 08/18/2022   TRIG 80 08/18/2022   CHOLHDL 2.8 08/18/2022   Last hemoglobin A1c Lab Results  Component Value Date   HGBA1C 5.5 10/25/2021   Last thyroid functions Lab Results  Component Value Date   TSH 0.134 (L) 10/25/2021   T4TOTAL 9.8 10/25/2021   Last vitamin D No results found for: "25OHVITD2", "25OHVITD3", "VD25OH" Last vitamin B12 and Folate No results found for: "VITAMINB12", "FOLATE"    The ASCVD Risk score (Arnett DK, et al., 2019) failed to calculate for the following  reasons:   Risk score cannot be calculated because patient has a medical history suggesting prior/existing ASCVD    Assessment & Plan:   1. Unintentional weight loss (Primary)/Prostate cancer screening: Patient continuing to lose weight, wanting an appetite stimulant but concerned something else is going on here. Alk phos elevated previously with normal GGT, low risk for bony pathology like osteoporosis, will work to get cancer screening up to date. Will check PSA for prostate screening, patient was scheduled for colonoscopy back in November, will call to re-schedule. Will get him set up with lung cancer screening as well. Repeat labs today, discussed increasing protein with protein shakes like Boost, Ensure, etc. Recheck in 1 month.   - COMPLETE METABOLIC PANEL WITH GFR - CBC w/Diff/Platelet - TSH - PSA  2. Nasal congestion: Ongoing, rule out COVID and prescribe nasal steroids.   - Novel Coronavirus, NAA ([facility]) - fluticasone (FLONASE) 50 MCG/ACT nasal spray; Place 2 sprays into both nostrils daily.  Dispense: 16 g; Refill: 6  3. Prostate cancer screening: Screening ordered.   - PSA  4. Encounter for screening for lung cancer: Screening ordered.   - Ambulatory Referral Lung Cancer Screening [facility]   Return in about 4 weeks (around 07/20/2023).    Margarita Mail, DO

## 2023-06-23 ENCOUNTER — Telehealth: Payer: Self-pay | Admitting: *Deleted

## 2023-06-23 ENCOUNTER — Encounter: Payer: Self-pay | Admitting: Cardiology

## 2023-06-23 ENCOUNTER — Telehealth: Payer: Self-pay | Admitting: Gastroenterology

## 2023-06-23 DIAGNOSIS — R0981 Nasal congestion: Secondary | ICD-10-CM | POA: Diagnosis not present

## 2023-06-23 NOTE — Telephone Encounter (Signed)
The patient called in to reschedule his colonoscopy. His prefer day is Tuesday or Thursday.

## 2023-06-23 NOTE — Telephone Encounter (Signed)
Cardiac clearance has been faxed to CV Preop team.    Informed patient that Dr. Tobi Bastos said we can move forward with scheduling and soon as I get his clearance I will call him back to get his colonoscopy scheduled.  Thanks,  [place], [place]

## 2023-06-23 NOTE — Telephone Encounter (Signed)
   Name: Blake Moore  DOB: [birth year]  MRN: [id number]  Primary Cardiologist: Deatrice Cage, MD   Preoperative team, please contact this patient and set up a phone call appointment for further preoperative risk assessment. Please obtain consent and complete medication review. Thank you for your help.  I confirm that guidance regarding antiplatelet and oral anticoagulation therapy has been completed and, if necessary, noted below.  None requested.  I also confirmed the patient resides in the state of [place] . As per [facility] Medical Board telemedicine laws, the patient must reside in the state in which the provider is licensed.   Josefa CHRISTELLA Beauvais, NP 06/23/2023, 4:10 PM [facility]

## 2023-06-23 NOTE — Telephone Encounter (Signed)
   Pre-operative Risk Assessment    Patient Name: Blake Moore  DOB: 08-07-1970 MRN: [id number]   Date of last office visit: 03/02/2023 Date of next office visit: None   Request for Surgical Clearance    Procedure:   Colonoscopy  Date of Surgery:  Clearance TBD                                 Surgeon:  Dr. Therisa Socks Group or Practice Name:  [facility] Phone number:  [phone] Fax number:  [phone]   Type of Clearance Requested:   - Medical    Type of Anesthesia:  General    Additional requests/questions:    Signed, Edsel Grayce Sanders   06/23/2023, 4:02 PM

## 2023-06-23 NOTE — Progress Notes (Signed)
 PERIOPERATIVE PRESCRIPTION FOR IMPLANTED CARDIAC DEVICE PROGRAMMING   Patient Information:  Patient: Blake Moore  MRN: [id number]  Date of Birth: April 14, 1971    Surgeon:  Dr. Therisa Socks Group or Practice Name:  [facility] Phone number:  [phone] Fax number:  [phone] Planned Procedure:  Colonoscopy   Date of Procedure:  TBD    Device Information:   Clinic EP Physician:   Dr. Ole Holts Device Type:  Defibrillator Manufacturer and Phone #:  [employer]: [phone] Pacemaker Dependent?:  No Date of Last Device Check:  04/30/2023        Normal Device Function?:  Yes     Electrophysiologist's Recommendations:   Have magnet available. Provide continuous ECG monitoring when magnet is used or reprogramming is to be performed.  Procedure may interfere with device function.  Magnet should be placed over device during procedure.  Per Device Clinic Standing Orders, Almarie ONEIDA Shutter  06/23/2023 4:09 PM

## 2023-06-23 NOTE — Addendum Note (Signed)
Addended by: Margarita Mail on: 06/23/2023 07:55 AM   Modules accepted: Orders

## 2023-06-23 NOTE — Telephone Encounter (Signed)
 Left message to call back to schedule tele pre op appt.

## 2023-06-24 LAB — CBC WITH DIFFERENTIAL/PLATELET
Absolute Lymphocytes: 1832 {cells}/uL (ref 850–3900)
Absolute Monocytes: 604 {cells}/uL (ref 200–950)
Basophils Absolute: 11 {cells}/uL (ref 0–200)
Basophils Relative: 0.3 %
Eosinophils Absolute: 38 {cells}/uL (ref 15–500)
Eosinophils Relative: 1 %
HCT: 37.9 % — ABNORMAL LOW (ref 38.5–50.0)
Hemoglobin: 12.1 g/dL — ABNORMAL LOW (ref 13.2–17.1)
MCH: 24.2 pg — ABNORMAL LOW (ref 27.0–33.0)
MCHC: 31.9 g/dL — ABNORMAL LOW (ref 32.0–36.0)
MCV: 75.6 fL — ABNORMAL LOW (ref 80.0–100.0)
MPV: 12.1 fL (ref 7.5–12.5)
Monocytes Relative: 15.9 %
Neutro Abs: 1315 {cells}/uL — ABNORMAL LOW (ref 1500–7800)
Neutrophils Relative %: 34.6 %
Platelets: 219 10*3/uL (ref 140–400)
RBC: 5.01 10*6/uL (ref 4.20–5.80)
RDW: 12.9 % (ref 11.0–15.0)
Total Lymphocyte: 48.2 %
WBC: 3.8 10*3/uL (ref 3.8–10.8)

## 2023-06-24 LAB — TSH: TSH: <0.01 m[IU]/L — ABNORMAL LOW (ref 0.40–4.50)

## 2023-06-24 LAB — COMPLETE METABOLIC PANEL WITH GFR
AG Ratio: 1.6 (calc) (ref 1.0–2.5)
ALT: 35 U/L (ref 9–46)
AST: 26 U/L (ref 10–35)
Albumin: 4.2 g/dL (ref 3.6–5.1)
Alkaline phosphatase (APISO): 235 U/L — ABNORMAL HIGH (ref 35–144)
BUN/Creatinine Ratio: 33 (calc) — ABNORMAL HIGH (ref 6–22)
BUN: 16 mg/dL (ref 7–25)
CO2: 28 mmol/L (ref 20–32)
Calcium: 9.1 mg/dL (ref 8.6–10.3)
Chloride: 105 mmol/L (ref 98–110)
Creat: 0.49 mg/dL — ABNORMAL LOW (ref 0.70–1.30)
Globulin: 2.6 g/dL (ref 1.9–3.7)
Glucose, Bld: 115 mg/dL — ABNORMAL HIGH (ref 65–99)
Potassium: 4.2 mmol/L (ref 3.5–5.3)
Sodium: 141 mmol/L (ref 135–146)
Total Bilirubin: 0.6 mg/dL (ref 0.2–1.2)
Total Protein: 6.8 g/dL (ref 6.1–8.1)
eGFR: 123 mL/min/{1.73_m2} (ref >=60)

## 2023-06-24 LAB — TEST AUTHORIZATION

## 2023-06-24 LAB — PSA: PSA: 0.57 ng/mL (ref <=4.00)

## 2023-06-24 LAB — THYROID PROFILE - CHCC
Free Thyroxine Index: 9.5 — ABNORMAL HIGH (ref 1.4–3.8)
T3 Uptake: 44 % — ABNORMAL HIGH (ref 22–35)
T4, Total: 21.5 ug/dL — ABNORMAL HIGH (ref 4.9–10.5)

## 2023-06-25 ENCOUNTER — Telehealth: Payer: Self-pay | Admitting: *Deleted

## 2023-06-25 LAB — SPECIMEN STATUS REPORT

## 2023-06-25 LAB — NOVEL CORONAVIRUS, NAA: SARS-CoV-2, NAA: NOT DETECTED

## 2023-06-25 NOTE — Telephone Encounter (Signed)
 Spoke with patient and scheduled him for a preop telehealth clearance appt for 07/08/23. Will route to requesting surgeons office to make them aware.

## 2023-06-25 NOTE — Telephone Encounter (Signed)
  Patient Consent for Virtual Visit        Blake Moore has provided verbal consent on 06/25/2023 for a virtual visit (video or telephone).   CONSENT FOR VIRTUAL VISIT FOR:  Blake Moore  By participating in this virtual visit I agree to the following:  I hereby voluntarily request, consent and authorize [facility] and its employed or contracted physicians, physician assistants, nurse practitioners or other licensed health care professionals (the Practitioner), to provide me with telemedicine health care services (the "Services) as deemed necessary by the treating Practitioner. I acknowledge and consent to receive the Services by the Practitioner via telemedicine. I understand that the telemedicine visit will involve communicating with the Practitioner through live audiovisual communication technology and the disclosure of certain medical information by electronic transmission. I acknowledge that I have been given the opportunity to request an in-person assessment or other available alternative prior to the telemedicine visit and am voluntarily participating in the telemedicine visit.  I understand that I have the right to withhold or withdraw my consent to the use of telemedicine in the course of my care at any time, without affecting my right to future care or treatment, and that the Practitioner or I may terminate the telemedicine visit at any time. I understand that I have the right to inspect all information obtained and/or recorded in the course of the telemedicine visit and may receive copies of available information for a reasonable fee.  I understand that some of the potential risks of receiving the Services via telemedicine include:  Delay or interruption in medical evaluation due to technological equipment failure or disruption; Information transmitted may not be sufficient (e.g. poor resolution of images) to allow for appropriate medical decision making by the Practitioner;  and/or  In rare instances, security protocols could fail, causing a breach of personal health information.  Furthermore, I acknowledge that it is my responsibility to provide information about my medical history, conditions and care that is complete and accurate to the best of my ability. I acknowledge that Practitioner's advice, recommendations, and/or decision may be based on factors not within their control, such as incomplete or inaccurate data provided by me or distortions of diagnostic images or specimens that may result from electronic transmissions. I understand that the practice of medicine is not an exact science and that Practitioner makes no warranties or guarantees regarding treatment outcomes. I acknowledge that a copy of this consent can be made available to me via my patient portal [facility] MyChart), or I can request a printed copy by calling the office of [facility].    I understand that my insurance will be billed for this visit.   I have read or had this consent read to me. I understand the contents of this consent, which adequately explains the benefits and risks of the Services being provided via telemedicine.  I have been provided ample opportunity to ask questions regarding this consent and the Services and have had my questions answered to my satisfaction. I give my informed consent for the services to be provided through the use of telemedicine in my medical care

## 2023-06-26 ENCOUNTER — Encounter: Payer: Self-pay | Admitting: Internal Medicine

## 2023-06-26 ENCOUNTER — Ambulatory Visit: Payer: 59 | Admitting: Internal Medicine

## 2023-06-26 ENCOUNTER — Other Ambulatory Visit: Payer: Self-pay | Admitting: Internal Medicine

## 2023-06-26 ENCOUNTER — Other Ambulatory Visit: Payer: Self-pay

## 2023-06-26 VITALS — BP 120/70 | HR 98 | Temp 97.9°F | Resp 16 | Ht 73.0 in | Wt 146.2 lb

## 2023-06-26 DIAGNOSIS — E0590 Thyrotoxicosis, unspecified without thyrotoxic crisis or storm: Secondary | ICD-10-CM

## 2023-06-26 DIAGNOSIS — I252 Old myocardial infarction: Secondary | ICD-10-CM

## 2023-06-26 DIAGNOSIS — I428 Other cardiomyopathies: Secondary | ICD-10-CM

## 2023-06-26 DIAGNOSIS — E0500 Thyrotoxicosis with diffuse goiter without thyrotoxic crisis or storm: Secondary | ICD-10-CM

## 2023-06-26 MED ORDER — CARVEDILOL 6.25 MG PO TABS: 6.2500 mg | ORAL_TABLET | Freq: Two times a day (BID) | ORAL | 1 refills | Status: DC

## 2023-06-26 NOTE — Patient Instructions (Addendum)
 Hyperthyroidism Hyperthyroidism refers to a thyroid  gland that is too active or overactive. The thyroid  gland is a small gland located in the lower front part of the neck, just in front of the windpipe (trachea). This gland makes hormones that: Help control how the body uses food for energy (metabolism). Help the heart and brain work well. Keep your bones strong. When the thyroid  is overactive, it produces too much of a hormone called thyroxine. What are the causes? This condition may be caused by: Graves' disease. This is a disorder in which the body's disease-fighting system (immune system) attacks the thyroid  gland. This is the most common cause. Inflammation of the thyroid  gland. A tumor in the thyroid  gland. Use of certain medicines, including: Prescription thyroid  hormone replacement. Herbal supplements that mimic thyroid  hormones. Amiodarone therapy. Solid or fluid-filled lumps within your thyroid  gland (thyroid  nodules). Taking in a large amount of iodine from foods or medicines. What increases the risk? You are more likely to develop this condition if: You are male. You have a family history of thyroid  conditions. You smoke tobacco. You use a medicine called lithium. You take medicines that affect the immune system (immunosuppressants). What are the signs or symptoms? Symptoms of this condition include: Nervousness. Inability to tolerate heat. Diarrhea. Rapid heart rate. Shaky hands. Restlessness. Sleep problems. Other symptoms may include: Heart skipping beats or making extra beats. Unexplained weight loss. Change in the texture of hair or skin. Loss of menstruation. Fatigue. Enlarged thyroid  gland or a lump in the thyroid  (nodule). You may also have symptoms of Graves' disease, which may include: Protruding eyes. Dry eyes. Red or swollen eyes. Problems with vision. How is this diagnosed? This condition may be diagnosed based on: Your symptoms and medical  history. A physical exam. Blood tests. Thyroid  ultrasound. This test involves using sound waves to produce images of the thyroid  gland. A thyroid  scan. A radioactive substance is injected into a vein, and images show how much iodine is present in the thyroid . Radioactive iodine uptake test (RAIU). A small amount of radioactive iodine is given by mouth to see how much iodine the thyroid  absorbs after a certain amount of time. How is this treated? Treatment depends on the cause and severity of the condition. Treatment may include: Medicines to reduce the amount of thyroid  hormone your body makes. Medicines to help manage your symptoms. Radioactive iodine treatment (radioiodine therapy). This involves swallowing a small dose of radioactive iodine, in capsule or liquid form, to kill thyroid  cells. Surgery to remove part or all of your thyroid  gland. You may need to take thyroid  hormone replacement medicine for the rest of your life after thyroid  surgery. Follow these instructions at home:  Take over-the-counter and prescription medicines only as told by your health care provider. Do not use any products that contain nicotine or tobacco. These products include cigarettes, chewing tobacco, and vaping devices, such as e-cigarettes. If you need help quitting, ask your health care provider. Follow any instructions from your health care provider about diet. You may be instructed to limit foods that contain iodine. Keep all follow-up visits. You will need to have blood tests regularly so that your health care provider can monitor your condition. Where to find more information [employer] of Diabetes and Digestive and Kidney Diseases: stagesync.si Contact a health care provider if: Your symptoms do not get better with treatment. You have a fever. You have abdominal pain. You feel dizzy. You are taking thyroid  hormone replacement medicine and: You have  symptoms of depression. You feel like you  are tired all the time. You gain weight. Get help right away if: You have sudden, unexplained confusion or other mental changes. You have chest pain. You have fast or irregular heartbeats (palpitations). You have difficulty breathing. These symptoms may be an emergency. Get help right away. Call 911. Do not wait to see if the symptoms will go away. Do not drive yourself to the hospital. Summary The thyroid  gland is a small gland located in the lower front part of the neck, just in front of the windpipe. Hyperthyroidism is when the thyroid  gland is too active and produces too much of a hormone called thyroxine. The most common cause is Graves' disease, a disorder in which your immune system attacks the thyroid  gland. Hyperthyroidism can cause various symptoms, such as unexplained weight loss, nervousness, inability to tolerate heat, or changes in your heartbeat. Treatment may include medicine to reduce the amount of thyroid  hormone your body makes, radioiodine therapy, surgery, or medicines to manage symptoms. This information is not intended to replace advice given to you by your health care provider. Make sure you discuss any questions you have with your health care provider. Document Revised: 06/28/2021 Document Reviewed: 06/28/2021 Elsevier Patient Education  2024 Elsevier Inc.  Methimazole  Tablets What is this medication? METHIMAZOLE  (meth IM a zole) treats high thyroid  levels (hyperthyroidism) in your body. It works by decreasing the amount of thyroid  hormone your body makes. This medicine may be used for other purposes; ask your health care provider or pharmacist if you have questions. COMMON BRAND NAME(S): Northyx, Tapazole  What should I tell my care team before I take this medication? They need to know if you have any of these conditions: Liver disease Low white blood cell levels An unusual or allergic reaction to methimazole , other medications, foods, dyes, or  preservatives Pregnant or trying to get pregnant Breastfeeding How should I use this medication? Take this medication by mouth with a glass of water . Follow the directions on the prescription label. You can take this medication with or without food. However, you should always take it the same way to make sure the effects are the same. Take your doses at regular intervals. Do not take your medication more often than directed. Do not stop taking this medication except on the advice of your care team. Talk to your care team about the use of this medication in children. Special care may be needed. While this medication may be prescribed for children for selected conditions, precautions do apply. Overdosage: If you think you have taken too much of this medicine contact a poison control center or emergency room at once. NOTE: This medicine is only for you. Do not share this medicine with others. What if I miss a dose? If you miss a dose, take it as soon as you can. If it is almost time for your next dose, take only that dose. Do not take double or extra doses. What may interact with this medication? Aminophylline Certain medications for blood pressure, heart disease, or irregular heartbeat, such as metoprolol and propranolol Digoxin Theophylline Warfarin This list may not describe all possible interactions. Give your health care provider a list of all the medicines, herbs, non-prescription drugs, or dietary supplements you use. Also tell them if you smoke, drink alcohol, or use illegal drugs. Some items may interact with your medicine. What should I watch for while using this medication? Visit your care team for regular checks on your progress. Tell  your care team if your symptoms do not start to get better or if they get worse. It may be some time before you see the benefit from this medication. You may need blood work done while you are taking this medication. Biotin (vitamin B7) may interfere with  your thyroid  function test. Stop taking supplements that contain biotin 2 days before your blood work. This medication may increase your risk of getting an infection. Call your care team for advice if you get a fever, chills, sore throat, or other symptoms of a cold or flu. Do not treat yourself. Try to avoid being around people who are sick. Talk to your care team if you may be pregnant. Serious birth defects can occur if you take this medication during pregnancy. If you are going to need surgery or a procedure, tell your care team that you are taking this medication. What side effects may I notice from receiving this medication? Side effects that you should report to your care team as soon as possible: Allergic reactions--skin rash, itching, hives, swelling of the face, lips, tongue, or throat Infection--fever, chills, cough, or sore throat Liver injury--right upper belly pain, loss of appetite, nausea, light-colored stool, dark yellow or brown urine, yellowing skin or eyes, unusual weakness or fatigue Low thyroid  levels (hypothyroidism)--unusual weakness or fatigue, increased sensitivity to cold, constipation, hair loss, dry skin, weight gain, feelings of depression Unusual weakness or fatigue, fever, headache, skin rash, muscle or joint pain, loss of appetite, pain, tingling, or numbness in the hands or feet Side effects that usually do not require medical attention (report to your care team if they continue or are bothersome): Change in taste Dizziness Hair loss Headache Nausea Upset stomach This list may not describe all possible side effects. Call your doctor for medical advice about side effects. You may report side effects to FDA at 1-800-FDA-1088. Where should I keep my medication? Keep out of the reach of children. Store at room temperature between 20 and 25 degrees C (68 and 77 degrees F). Keep tightly closed. Protect from light. Throw away any unused medication after the expiration  date. NOTE: This sheet is a summary. It may not cover all possible information. If you have questions about this medicine, talk to your doctor, pharmacist, or health care provider.  2024 Elsevier/Gold Standard (2022-07-25 00:00:00)

## 2023-06-26 NOTE — Progress Notes (Signed)
   Acute Office Visit  Subjective:     Patient ID: Blake Moore, male    DOB: [birth year], 53 y.o.   MRN: [id number]  Chief Complaint  Patient presents with   Follow-up    labs    HPI Patient is in today for follow up on recent labs. Patient was seen earlier in the week complaining of unintentional weight loss. Work up revealed a TSH of <0.01 and free t4 was 21.5, t3 uptake 44. Patient states he has palpitations (but has not been on his Coreg  lately, started again 2 days ago after the labs came back), diarrhea and some anxiety. No family history of autoimmune disease or thyroid  issues that he knows of.   Review of Systems  Constitutional:  Positive for weight loss. Negative for chills and fever.  Respiratory:  Negative for shortness of breath.   Cardiovascular:  Positive for palpitations. Negative for chest pain.  Gastrointestinal:  Positive for diarrhea.        Objective:    BP 120/70 (Cuff Size: Normal)   Pulse 98   Temp 97.9 F (36.6 C) (Oral)   Resp 16   Ht 6' 1 (1.854 m)   Wt 146 lb 3.2 oz (66.3 kg)   SpO2 96%   BMI 19.29 kg/m  BP Readings from Last 3 Encounters:  06/26/23 120/70  06/22/23 120/60  03/31/23 (!) 115/99   Wt Readings from Last 3 Encounters:  06/26/23 146 lb 3.2 oz (66.3 kg)  06/22/23 146 lb 3.2 oz (66.3 kg)  03/31/23 151 lb 6.4 oz (68.7 kg)      Physical Exam Constitutional:      Appearance: Normal appearance.  HENT:     Head: Normocephalic and atraumatic.  Eyes:     Conjunctiva/sclera: Conjunctivae normal.  Neck:     Comments: Mildly enlarged thyroid  gland, no nodules  Cardiovascular:     Rate and Rhythm: Normal rate and regular rhythm.  Pulmonary:     Effort: Pulmonary effort is normal.     Breath sounds: Normal breath sounds.  Musculoskeletal:     Cervical back: No tenderness.  Lymphadenopathy:     Cervical: No cervical adenopathy.  Skin:    General: Skin is warm and dry.  Neurological:     General: No focal deficit present.      Mental Status: He is alert. Mental status is at baseline.  Psychiatric:        Mood and Affect: Mood normal.        Behavior: Behavior normal.     No results found for any visits on 06/26/23.      Assessment & Plan:   1. Hyperthyroidism (Primary)/Nonischemic cardiomyopathy (HCC)/History of MI (myocardial infarction): Will screen for Graves' antibodies here today, if positive will treat with Methimazole , already discussed with the patient today. Discussed Graves' pathology and treatment with him today. He will go back on his Coreg  BID to help with palpitations, will refill.   - Thyrotropin receptor autoabs - carvedilol  (COREG ) 6.25 MG tablet; Take 1 tablet (6.25 mg total) by mouth 2 (two) times daily with a meal.  Dispense: 180 tablet; Refill: 1   Return for will call to schedule. After labs   Sharyle Fischer, DO

## 2023-06-30 LAB — TRAB (TSH RECEPTOR BINDING ANTIBODY): TRAB: 20.46 [IU]/L — ABNORMAL HIGH (ref <=2.00)

## 2023-06-30 MED ORDER — METHIMAZOLE 10 MG PO TABS: 10.0000 mg | ORAL_TABLET | Freq: Every day | ORAL | 1 refills | Status: DC

## 2023-06-30 NOTE — Addendum Note (Signed)
Addended by: Margarita Mail on: 06/30/2023 04:07 PM   Modules accepted: Orders

## 2023-07-07 ENCOUNTER — Telehealth: Payer: 59

## 2023-07-07 NOTE — Progress Notes (Unsigned)
Virtual Visit via Telephone Note   Because of Blake Moore's co-morbid illnesses, he is at least at moderate risk for complications without adequate follow up.  This format is felt to be most appropriate for this patient at this time.  The patient did not have access to video technology/had technical difficulties with video requiring transitioning to audio format only (telephone).  All issues noted in this document were discussed and addressed.  No physical exam could be performed with this format.  Please refer to the patient's chart for his consent to telehealth for [facility].  Evaluation Performed:  Preoperative cardiovascular risk assessment _____________   Date:  07/07/2023   Patient ID:  Blake Moore, DOB June 13, 1970, MRN [id number] Patient Location:  Home Provider location:   Office  [facility] Providers Cardiologist:  Blake Bears, MD Electrophysiologist:  Blake Prude, MD {    Chief Complaint / Patient Profile   53 y.o. 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 Tobi Moore and presents today for telephonic preoperative cardiovascular risk assessment.  History of Present Illness    Blake Moore is a 53 y.o. male who presents via audio/video conferencing for a telehealth visit today.  Pt was last seen in cardiology clinic on 03/02/2023 by Dr. Lalla Moore.  At that time Blake Moore was stable from a cardiac standpoint.  The patient is now pending procedure as outlined above. Since his last visit, he is doing well. Patient denies shortness of breath, dyspnea on exertion, lower extremity edema, orthopnea or PND. No chest pain, pressure, or tightness. No palpitations.  He recently started medications for hyperthyroidism. He has been hesitant to exercise since his cardiac arrest. 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   Past Medical History    Past Medical History:   Diagnosis Date   Heart attack [facility])    Past Surgical History:  Procedure Laterality Date   COLONOSCOPY WITH PROPOFOL N/A 03/31/2023   Procedure: COLONOSCOPY WITH PROPOFOL;  Surgeon: Blake Mood, MD;  Location: [facility] ENDOSCOPY;  Service: Gastroenterology;  Laterality: N/A;   ESOPHAGOGASTRODUODENOSCOPY (EGD) WITH PROPOFOL N/A 03/31/2023   Procedure: ESOPHAGOGASTRODUODENOSCOPY (EGD) WITH PROPOFOL;  Surgeon: Blake Mood, MD;  Location[facility] ENDOSCOPY;  Service: Gastroenterology;  Laterality: N/A;   ICD IMPLANT N/A 10/30/2021   Procedure: ICD IMPLANT;  Surgeon: Blake Prude, MD;  Location: [facility] INVASIVE CV LAB;  Service: Cardiovascular;  Laterality: N/A;   LEFT HEART CATH AND CORONARY ANGIOGRAPHY N/A 10/28/2021   Procedure: LEFT HEART CATH AND CORONARY ANGIOGRAPHY;  Surgeon: Blake Ouch, MD;  Location: [facility] INVASIVE CV LAB;  Service: Cardiovascular;  Laterality: N/A;   PACEMAKER IMPLANT      Allergies  No Known Allergies  Home Medications    Prior to Admission medications   Medication Sig Start Date End Date Taking? Authorizing Provider  carvedilol (COREG) 6.25 MG tablet Take 1 tablet (6.25 mg total) by mouth 2 (two) times daily with a meal. 06/26/23   Blake Mail, DO  fluticasone [facility]) 50 MCG/ACT nasal spray Place 2 sprays into both nostrils daily. 06/22/23   Blake Mail, DO  methimazole (TAPAZOLE) 10 MG tablet Take 1 tablet (10 mg total) by mouth daily. 06/30/23   Blake Mail, DO  Na Sulfate-K Sulfate-Mg Sulf 17.5-3.13-1.6 GM/177ML SOLN Take by mouth as directed. colonoscopy Patient not taking: Reported on 06/26/2023 02/25/23   [provider]  omeprazole (PRILOSEC) 20 MG capsule Take 1 capsule (  20 mg total) by mouth daily. 01/09/23   Blake Mail, DO    Physical Exam    Vital Signs:  Blake Moore does not have vital signs available for review today.  Given telephonic nature of communication, physical exam is limited. AAOx3. NAD. Normal  affect.  Speech and respirations are unlabored.   Assessment & Plan    Primary Cardiologist: Blake Bears, MD  Preoperative cardiovascular risk assessment.  Colonoscopy by Dr. Tobi Moore.  Chart reviewed as part of pre-operative protocol coverage. According to the RCRI, patient has a 0.9% risk of MACE. 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   Given past medical history and time since last visit, based on ACC/AHA guidelines, Blake Moore would be at acceptable risk for the planned procedure without further cardiovascular testing.   Patient was advised that if he develops new symptoms prior to surgery to contact our office to arrange a follow-up appointment.  he verbalized understanding.     I will route this recommendation to the requesting party via Epic fax function.  Please call with questions.  Time:   Today, I have spent 5 minutes with the patient with telehealth technology discussing medical history, symptoms, and management plan.     Blake Levering, NP  07/07/2023, 4:01 PM

## 2023-07-08 ENCOUNTER — Ambulatory Visit: Payer: 59 | Attending: Internal Medicine | Admitting: Student

## 2023-07-08 DIAGNOSIS — Z01810 Encounter for preprocedural cardiovascular examination: Secondary | ICD-10-CM | POA: Diagnosis not present

## 2023-07-10 NOTE — Telephone Encounter (Addendum)
LVM for patient to call office to schedule his colonoscopy with Dr. Tobi Bastos.  Cardiac Clearance noted below.  Copied from visit on 07/07/23 wtihe NP Carlos Levering.  Scheduling Notes:  DX /elevated alkaline phosphatase level R74.8 Loss of weight R63.4 screening colonoscopy Z12.11/  CPT 45378  Preop Assessment:  "Preoperative cardiovascular risk assessment.  Colonoscopy by Dr. Tobi Bastos.   Chart reviewed as part of pre-operative protocol coverage. According to the RCRI, patient has a 0.9% risk of MACE. 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    Given past medical history and time since last visit, based on ACC/AHA guidelines, Blake Moore would be at acceptable risk for the planned procedure without further cardiovascular testing.    Patient was advised that if he develops new symptoms prior to surgery to contact our office to arrange a follow-up appointment.  he verbalized understanding.      I will route this recommendation to the requesting party via Epic fax function.   Please call with questions.   Time:   Today, I have spent 5 minutes with the patient with telehealth technology discussing medical history, symptoms, and management plan".       Carlos Levering, NP   07/07/2023, 4:01 PM

## 2023-07-20 ENCOUNTER — Encounter: Payer: Self-pay | Admitting: Internal Medicine

## 2023-07-20 ENCOUNTER — Other Ambulatory Visit: Payer: Self-pay

## 2023-07-20 ENCOUNTER — Ambulatory Visit: Payer: 59 | Admitting: Internal Medicine

## 2023-07-20 VITALS — BP 120/70 | HR 88 | Temp 98.2°F | Resp 16 | Ht 73.0 in | Wt 152.1 lb

## 2023-07-20 DIAGNOSIS — E0500 Thyrotoxicosis with diffuse goiter without thyrotoxic crisis or storm: Secondary | ICD-10-CM | POA: Diagnosis not present

## 2023-07-20 DIAGNOSIS — E0590 Thyrotoxicosis, unspecified without thyrotoxic crisis or storm: Secondary | ICD-10-CM

## 2023-07-20 NOTE — Progress Notes (Signed)
   Established Office Visit  Subjective:     Patient ID: Blake Moore, male    DOB: 24-Sep-1970, 53 y.o.   MRN: [id number]  Chief Complaint  Patient presents with   Follow-up    4 month recheck    HPI Patient is in today for follow up on Graves disease.  Last labs TSH of <0.01 and free t4 was 21.5, t3 uptake 44 and positive thyroid receptor antibody. He had been having palpitations, weight loss, diarrhea and anxiety but these symptoms have improved since starting Methimazole 10 mg. He is also still on Coreg and doing well.   Review of Systems  Constitutional:  Negative for chills, fever and weight loss.  Respiratory:  Negative for shortness of breath.   Cardiovascular:  Negative for chest pain and palpitations.  Gastrointestinal:  Negative for diarrhea.        Objective:    BP 120/70 (Cuff Size: Normal)   Pulse 88   Temp 98.2 F (36.8 C) (Oral)   Resp 16   Ht 6\' 1"  (1.854 m)   Wt 152 lb 1.6 oz (69 kg)   SpO2 99%   BMI 20.07 kg/m  BP Readings from Last 3 Encounters:  06/26/23 120/70  06/22/23 120/60  03/31/23 (!) 115/99   Wt Readings from Last 3 Encounters:  07/20/23 152 lb 1.6 oz (69 kg)  06/26/23 146 lb 3.2 oz (66.3 kg)  06/22/23 146 lb 3.2 oz (66.3 kg)      Physical Exam Constitutional:      Appearance: Normal appearance.  HENT:     Head: Normocephalic and atraumatic.  Eyes:     Conjunctiva/sclera: Conjunctivae normal.  Neck:     Comments: Mildly enlarged thyroid gland, no nodules  Cardiovascular:     Rate and Rhythm: Normal rate and regular rhythm.  Pulmonary:     Effort: Pulmonary effort is normal.     Breath sounds: Normal breath sounds.  Musculoskeletal:     Cervical back: No tenderness.  Lymphadenopathy:     Cervical: No cervical adenopathy.  Skin:    General: Skin is warm and dry.  Neurological:     General: No focal deficit present.     Mental Status: He is alert. Mental status is at baseline.  Psychiatric:        Mood and Affect: Mood  normal.        Behavior: Behavior normal.     No results found for any visits on 07/20/23.      Assessment & Plan:   1. Graves disease (Primary): Doing better on the Methimazole, repeat labs today. Referral to Endocrinology already placed, waiting to hear about a schedule. Will order thyroid US as well.  - Thyroid Panel With TSH - US THYROID; Future   Return for will call to schedule.   Margarita Mail, DO

## 2023-07-20 NOTE — Patient Instructions (Addendum)
 It was great seeing you today!  Plan discussed at today's visit: -Blood work ordered today, results will be uploaded to MyChart.  -Continue current dose of medication unless I let you know anything different -Please call to schedule thyroid ultrasound -Referral to Endocrinology placed  Follow up in: will discuss after labs   Take care and let us know if you have any questions or concerns prior to your next visit.  Dr. Caralee Ates   Hyperthyroidism Hyperthyroidism refers to a thyroid gland that is too active or overactive. The thyroid gland is a small gland located in the lower front part of the neck, just in front of the windpipe (trachea). This gland makes hormones that: Help control how the body uses food for energy (metabolism). Help the heart and brain work well. Keep your bones strong. When the thyroid is overactive, it produces too much of a hormone called thyroxine. What are the causes? This condition may be caused by: Graves' disease. This is a disorder in which the body's disease-fighting system (immune system) attacks the thyroid gland. This is the most common cause. Inflammation of the thyroid gland. A tumor in the thyroid gland. Use of certain medicines, including: Prescription thyroid hormone replacement. Herbal supplements that mimic thyroid hormones. Amiodarone therapy. Solid or fluid-filled lumps within your thyroid gland (thyroid nodules). Taking in a large amount of iodine from foods or medicines. What increases the risk? You are more likely to develop this condition if: You are male. You have a family history of thyroid conditions. You smoke tobacco. You use a medicine called lithium. You take medicines that affect the immune system (immunosuppressants). What are the signs or symptoms? Symptoms of this condition include: Nervousness. Inability to tolerate heat. Diarrhea. Rapid heart rate. Shaky hands. Restlessness. Sleep problems. Other symptoms may  include: Heart skipping beats or making extra beats. Unexplained weight loss. Change in the texture of hair or skin. Loss of menstruation. Fatigue. Enlarged thyroid gland or a lump in the thyroid (nodule). You may also have symptoms of Graves' disease, which may include: Protruding eyes. Dry eyes. Red or swollen eyes. Problems with vision. How is this diagnosed? This condition may be diagnosed based on: Your symptoms and medical history. A physical exam. Blood tests. Thyroid ultrasound. This test involves using sound waves to produce images of the thyroid gland. A thyroid scan. A radioactive substance is injected into a vein, and images show how much iodine is present in the thyroid. Radioactive iodine uptake test (RAIU). A small amount of radioactive iodine is given by mouth to see how much iodine the thyroid absorbs after a certain amount of time. How is this treated? Treatment depends on the cause and severity of the condition. Treatment may include: Medicines to reduce the amount of thyroid hormone your body makes. Medicines to help manage your symptoms. Radioactive iodine treatment (radioiodine therapy). This involves swallowing a small dose of radioactive iodine, in capsule or liquid form, to kill thyroid cells. Surgery to remove part or all of your thyroid gland. You may need to take thyroid hormone replacement medicine for the rest of your life after thyroid surgery. Follow these instructions at home:  Take over-the-counter and prescription medicines only as told by your health care provider. Do not use any products that contain nicotine or tobacco. These products include cigarettes, chewing tobacco, and vaping devices, such as e-cigarettes. If you need help quitting, ask your health care provider. Follow any instructions from your health care provider about diet. You may be instructed  to limit foods that contain iodine. Keep all follow-up visits. You will need to have blood  tests regularly so that your health care provider can monitor your condition. Where to find more information [employer] of Diabetes and Digestive and Kidney Diseases: [email or web address] Contact a health care provider if: Your symptoms do not get better with treatment. You have a fever. You have abdominal pain. You feel dizzy. You are taking thyroid hormone replacement medicine and: You have symptoms of depression. You feel like you are tired all the time. You gain weight. Get help right away if: You have sudden, unexplained confusion or other mental changes. You have chest pain. You have fast or irregular heartbeats (palpitations). You have difficulty breathing. These symptoms may be an emergency. Get help right away. Call 911. Do not wait to see if the symptoms will go away. Do not drive yourself to the hospital. Summary The thyroid gland is a small gland located in the lower front part of the neck, just in front of the windpipe. Hyperthyroidism is when the thyroid gland is too active and produces too much of a hormone called thyroxine. The most common cause is Graves' disease, a disorder in which your immune system attacks the thyroid gland. Hyperthyroidism can cause various symptoms, such as unexplained weight loss, nervousness, inability to tolerate heat, or changes in your heartbeat. Treatment may include medicine to reduce the amount of thyroid hormone your body makes, radioiodine therapy, surgery, or medicines to manage symptoms. This information is not intended to replace advice given to you by your health care provider. Make sure you discuss any questions you have with your health care provider. Document Revised: 06/28/2021 Document Reviewed: 06/28/2021 Elsevier Patient Education  2024 [employer].

## 2023-07-21 LAB — THYROID PANEL WITH TSH
Free Thyroxine Index: 5.7 — ABNORMAL HIGH (ref 1.4–3.8)
T3 Uptake: 35 % (ref 22–35)
T4, Total: 16.2 ug/dL — ABNORMAL HIGH (ref 4.9–10.5)
TSH: <0.01 m[IU]/L — ABNORMAL LOW (ref 0.40–4.50)

## 2023-07-21 MED ORDER — METHIMAZOLE 10 MG PO TABS: 10.0000 mg | ORAL_TABLET | Freq: Two times a day (BID) | ORAL | 1 refills | Status: DC

## 2023-07-21 NOTE — Addendum Note (Signed)
 Addended by: Margarita Mail on: 07/21/2023 07:59 AM   Modules accepted: Orders

## 2023-07-28 ENCOUNTER — Ambulatory Visit
Admission: RE | Admit: 2023-07-28 | Discharge: 2023-07-28 | Disposition: A | Discharge status: Home / Self Care | Source: Ambulatory Visit | Attending: Internal Medicine | Admitting: Internal Medicine

## 2023-07-28 ENCOUNTER — Telehealth: Payer: Self-pay

## 2023-07-28 DIAGNOSIS — E049 Nontoxic goiter, unspecified: Secondary | ICD-10-CM | POA: Diagnosis not present

## 2023-07-28 DIAGNOSIS — E0500 Thyrotoxicosis with diffuse goiter without thyrotoxic crisis or storm: Secondary | ICD-10-CM | POA: Insufficient documentation

## 2023-07-28 NOTE — Telephone Encounter (Unsigned)
 Copied from CRM [id number]. Topic: Clinical - Medical Advice >> Jul 28, 2023 12:57 PM Blake Moore wrote: Reason for CRM: The patient would like to discuss the upcoming imaging of their Thyroid that is scheduled for today 07/28/23  The patient is concerned with the anticipated cost of $143 associated with the upcoming procedure   Please contact the patient further when possible

## 2023-07-28 NOTE — Telephone Encounter (Signed)
 Spoke to patient he is getting [place] today

## 2023-07-30 ENCOUNTER — Ambulatory Visit (INDEPENDENT_AMBULATORY_CARE_PROVIDER_SITE_OTHER): Payer: 59

## 2023-07-30 DIAGNOSIS — I428 Other cardiomyopathies: Secondary | ICD-10-CM

## 2023-08-05 LAB — CUP PACEART REMOTE DEVICE CHECK
Battery Remaining Longevity: 162 mo
Battery Remaining Percentage: 100 %
Brady Statistic RV Percent Paced: 0 %
Date Time Interrogation Session: 20250313023300
HighPow Impedance: 80 Ohm
Implantable Lead Connection Status: 753985
Implantable Lead Implant Date: 20230614
Implantable Lead Location: 753860
Implantable Lead Model: 673
Implantable Lead Serial Number: 196637
Implantable Pulse Generator Implant Date: 20230614
Lead Channel Impedance Value: 447 Ohm
Lead Channel Setting Pacing Amplitude: 2.5 V
Lead Channel Setting Pacing Pulse Width: 0.4 ms
Lead Channel Setting Sensing Sensitivity: 0.5 mV
Pulse Gen Serial Number: 287113
Zone Setting Status: 755011

## 2023-08-09 ENCOUNTER — Encounter: Payer: Self-pay | Admitting: Cardiology

## 2023-09-11 NOTE — Progress Notes (Signed)
 Remote ICD transmission.

## 2023-09-11 NOTE — Addendum Note (Signed)
 Addended by: Toma Arts A on: 09/11/2023 02:22 PM   Modules accepted: Orders

## 2023-10-03 ENCOUNTER — Other Ambulatory Visit: Payer: Self-pay | Admitting: Internal Medicine

## 2023-10-03 DIAGNOSIS — E0590 Thyrotoxicosis, unspecified without thyrotoxic crisis or storm: Secondary | ICD-10-CM

## 2023-10-03 DIAGNOSIS — E0500 Thyrotoxicosis with diffuse goiter without thyrotoxic crisis or storm: Secondary | ICD-10-CM

## 2023-10-06 NOTE — Telephone Encounter (Signed)
 Requested medication (s) are due for refill today - yes  Requested medication (s) are on the active medication list -yes  Future visit scheduled -no  Last refill: 07/21/23 #60 1RF  Notes to clinic: non delegated Rx  Requested Prescriptions  Pending Prescriptions Disp Refills   methimazole  (TAPAZOLE ) 10 MG tablet [Pharmacy Med Name: methIMAzole  10 MG Oral Tablet] 60 tablet 0    Sig: Take 1 tablet by mouth twice daily     Not Delegated - Endocrinology:  Hyperthyroid Agents Failed - 10/06/2023 12:19 PM      Failed - This refill cannot be delegated      Failed - TSH in normal range and within 180 days    TSH  Date Value Ref Range Status  07/20/2023 <0.01 (L) 0.40 - 4.50 mIU/L Final         Failed - T4 free in normal range and within 180 days    T4, Total  Date Value Ref Range Status  07/20/2023 16.2 (H) 4.9 - 10.5 mcg/dL Final         Passed - T3 Total in normal range and within 180 days    No results found for: "T3TOTAL", "T3FREE"       Passed - Valid encounter within last 6 months    Recent Outpatient Visits           2 months ago Graves disease   [facility] Rockney Cid, DO   3 months ago Hyperthyroidism   [facility] Rockney Cid, DO   3 months ago Unintentional weight loss   [facility] Rockney Cid, DO                 Requested Prescriptions  Pending Prescriptions Disp Refills   methimazole  (TAPAZOLE ) 10 MG tablet [Pharmacy Med Name: methIMAzole  10 MG Oral Tablet] 60 tablet 0    Sig: Take 1 tablet by mouth twice daily     Not Delegated - Endocrinology:  Hyperthyroid Agents Failed - 10/06/2023 12:19 PM      Failed - This refill cannot be delegated      Failed - TSH in normal range and within 180 days    TSH  Date Value Ref Range Status  07/20/2023 <0.01 (L) 0.40 - 4.50 mIU/L Final         Failed - T4 free in normal range and within 180 days    T4,  Total  Date Value Ref Range Status  07/20/2023 16.2 (H) 4.9 - 10.5 mcg/dL Final         Passed - T3 Total in normal range and within 180 days    No results found for: "T3TOTAL", "T3FREE"       Passed - Valid encounter within last 6 months    Recent Outpatient Visits           2 months ago Graves disease   [facility] Rockney Cid, DO   3 months ago Hyperthyroidism   [facility] Rockney Cid, DO   3 months ago Unintentional weight loss   [facility] Rockney Cid, [place]

## 2023-10-07 ENCOUNTER — Other Ambulatory Visit: Payer: Self-pay | Admitting: Internal Medicine

## 2023-10-07 DIAGNOSIS — E0500 Thyrotoxicosis with diffuse goiter without thyrotoxic crisis or storm: Secondary | ICD-10-CM

## 2023-10-29 ENCOUNTER — Ambulatory Visit (INDEPENDENT_AMBULATORY_CARE_PROVIDER_SITE_OTHER): Payer: 59

## 2023-10-29 DIAGNOSIS — I428 Other cardiomyopathies: Secondary | ICD-10-CM

## 2023-10-29 LAB — CUP PACEART REMOTE DEVICE CHECK
Battery Remaining Longevity: 156 mo
Battery Remaining Percentage: 100 %
Brady Statistic RV Percent Paced: 0 %
Date Time Interrogation Session: 20250612022100
HighPow Impedance: 78 Ohm
Implantable Lead Connection Status: 753985
Implantable Lead Implant Date: 20230614
Implantable Lead Location: 753860
Implantable Lead Model: 673
Implantable Lead Serial Number: 196637
Implantable Pulse Generator Implant Date: 20230614
Lead Channel Impedance Value: 501 Ohm
Lead Channel Setting Pacing Amplitude: 2.5 V
Lead Channel Setting Pacing Pulse Width: 0.4 ms
Lead Channel Setting Sensing Sensitivity: 0.5 mV
Pulse Gen Serial Number: 287113
Zone Setting Status: 755011

## 2023-10-31 ENCOUNTER — Ambulatory Visit: Payer: Self-pay | Admitting: Cardiology

## 2023-12-02 ENCOUNTER — Other Ambulatory Visit: Payer: Self-pay | Admitting: Internal Medicine

## 2023-12-02 DIAGNOSIS — E0500 Thyrotoxicosis with diffuse goiter without thyrotoxic crisis or storm: Secondary | ICD-10-CM

## 2023-12-02 DIAGNOSIS — E0590 Thyrotoxicosis, unspecified without thyrotoxic crisis or storm: Secondary | ICD-10-CM

## 2023-12-03 NOTE — Telephone Encounter (Signed)
 Pharmacist called in to check refill status because the patient showed up at the [facility] pharmacy. I informed her that the request was still pending.

## 2023-12-03 NOTE — Telephone Encounter (Signed)
 Second request

## 2023-12-04 ENCOUNTER — Other Ambulatory Visit: Payer: Self-pay | Admitting: Emergency Medicine

## 2023-12-04 ENCOUNTER — Telehealth: Payer: Self-pay

## 2023-12-04 DIAGNOSIS — E0590 Thyrotoxicosis, unspecified without thyrotoxic crisis or storm: Secondary | ICD-10-CM

## 2023-12-04 DIAGNOSIS — E0500 Thyrotoxicosis with diffuse goiter without thyrotoxic crisis or storm: Secondary | ICD-10-CM | POA: Diagnosis not present

## 2023-12-04 NOTE — Telephone Encounter (Signed)
Order sent for refill

## 2023-12-04 NOTE — Telephone Encounter (Signed)
 Refill request on   methimazole  (TAPAZOLE ) 10 MG tablet

## 2023-12-04 NOTE — Telephone Encounter (Signed)
 Requested medications are due for refill today.  yes  Requested medications are on the active medications list.  yes  Last refill. 10/07/2023 #60 0 rf  Future visit scheduled.   no  Notes to clinic.  Abnormal labs.    Requested Prescriptions  Pending Prescriptions Disp Refills   methimazole  (TAPAZOLE ) 10 MG tablet [Pharmacy Med Name: methIMAzole  10 MG Oral Tablet] 60 tablet 0    Sig: Take 1 tablet by mouth twice daily     Not Delegated - Endocrinology:  Hyperthyroid Agents Failed - 12/04/2023  8:02 AM      Failed - This refill cannot be delegated      Failed - TSH in normal range and within 180 days    TSH  Date Value Ref Range Status  07/20/2023 <0.01 (L) 0.40 - 4.50 mIU/L Final         Failed - T4 free in normal range and within 180 days    T4, Total  Date Value Ref Range Status  07/20/2023 16.2 (H) 4.9 - 10.5 mcg/dL Final         Passed - T3 Total in normal range and within 180 days    No results found for: T3TOTAL, T3FREE       Passed - Valid encounter within last 6 months    Recent Outpatient Visits           4 months ago Graves disease   [facility] Bernardo Fend, DO   5 months ago Hyperthyroidism   [facility] Bernardo Fend, DO   5 months ago Unintentional weight loss   [facility] Bernardo Fend, [place]

## 2023-12-05 LAB — THYROID PANEL WITH TSH
Free Thyroxine Index: 1 — ABNORMAL LOW (ref 1.4–3.8)
T3 Uptake: 25 % (ref 22–35)
T4, Total: 3.9 ug/dL — ABNORMAL LOW (ref 4.9–10.5)
TSH: 5.32 m[IU]/L — ABNORMAL HIGH (ref 0.40–4.50)

## 2023-12-07 ENCOUNTER — Other Ambulatory Visit: Payer: Self-pay | Admitting: Internal Medicine

## 2023-12-07 ENCOUNTER — Ambulatory Visit: Payer: Self-pay | Admitting: Internal Medicine

## 2023-12-07 DIAGNOSIS — E0500 Thyrotoxicosis with diffuse goiter without thyrotoxic crisis or storm: Secondary | ICD-10-CM

## 2023-12-07 DIAGNOSIS — E0590 Thyrotoxicosis, unspecified without thyrotoxic crisis or storm: Secondary | ICD-10-CM

## 2023-12-07 MED ORDER — METHIMAZOLE 10 MG PO TABS: 10.0000 mg | ORAL_TABLET | Freq: Every day | ORAL | 0 refills | Status: DC

## 2023-12-14 ENCOUNTER — Telehealth: Payer: Self-pay

## 2023-12-14 NOTE — Telephone Encounter (Signed)
 Alert received from [employer] for Ventricular shock therapy delivered to convert arrhythmia. Events occurred 7/26 @ 05:39 and 06:54, EGM's c/w abrupt onset of VF, first event converted with HV therapy @ 41J x2, second event converted with HV therapy 41J.  Presenting rhythm regular VS  Route to triage high alert per protocol.  Attempted to contact patient. No answer, left message to call back.

## 2023-12-14 NOTE — Telephone Encounter (Signed)
 Pt called back and would like call returned

## 2023-12-14 NOTE — Telephone Encounter (Signed)
Attempted to contact patient. No answer, left message to call back

## 2023-12-15 NOTE — Telephone Encounter (Signed)
 Patient denies any symptoms of shock and reports he was unaware/asleep. Patient denies any recent complaints and compliant with Coreg  6.25 mg BID.   Apt made 12/16/23 @ 9:00 AM in [place] office d/t urgent and pt needing to since he works in [place] and will need to get to work in am. The ok was given by covering Armed forces training and education officer, RN to add onto Dr. Ramona schedule d/t over booking.  Advised no driving per [place] DMV x6 months. Advised patient if he received ANY more shocks to call 911 and go straight to ER. Pt voiced understanding and agreeable to plan.

## 2023-12-15 NOTE — Progress Notes (Unsigned)
  Electrophysiology Office Follow up Visit Note:    Date:  12/16/2023   ID:  Blake Moore, DOB [birth year], MRN [id number]  PCP:  Bernardo Fend, DO  [facility] Cardiologist:  Deatrice Cage, MD  [facility] Electrophysiologist:  OLE ONEIDA HOLTS, MD    Interval History:     Blake Moore is a 53 y.o. male who presents for a follow up visit.   I last saw the patient March 02, 2023. he has a history of ventricular fibrillation with an ICD in situ.  At the last appointment he was doing well.  We received a notice on July 28 for 3 ICD shocks.  These were successfully detected and treated by his defibrillator.  He was asymptomatic and asleep at the time of the event.  Today he tells me that he had consumed heavy alcohol the night before he received his shock.  He does not recall feeling the shock but jolted awake and knew something had happened.  No recent illnesses.  He reports compliance with his carvedilol  prescription.         Past medical, surgical, social and family history were reviewed.  ROS:   Please see the history of present illness.    All other systems reviewed and are negative.  EKGs/Labs/Other Studies Reviewed:    The following studies were reviewed today:  December 16, 2023 in clinic device interrogation personally reviewed Presenting rhythm ventricular sensed.  Histogram shows average heart rate in the 90s.  Battery longevity 12.5 years Lead parameters stable Shortened VF detection to 1 second from 2.5 seconds Delivered shock impedance is in the 60s 1 episode required 2 shocks to terminate  October 29, 2021 cardiac MRI EF 49% No scar        Physical Exam:    VS:  BP 120/80   Pulse 75   Ht 6' 1 (1.854 m)   Wt 167 lb (75.8 kg)   SpO2 97%   BMI 22.03 kg/m     Wt Readings from Last 3 Encounters:  12/16/23 167 lb (75.8 kg)  07/20/23 152 lb 1.6 oz (69 kg)  06/26/23 146 lb 3.2 oz (66.3 kg)     GEN: no distress CARD: RRR, No MRG.  ICD  pocket well-healed RESP: No IWOB. CTAB.      ASSESSMENT:    1. Ventricular fibrillation (HCC)   2. ICD (implantable cardioverter-defibrillator) in place    PLAN:    In order of problems listed above:  #Ventricular fibrillation One of the episodes appears to be triggered from a short long short sequence from PVCs during his sleep.   Recommend increasing carvedilol  to 12.5 mg by mouth twice daily  If recurrence, favor starting amiodarone.  Repeat BMP and magnesium   Encouraged him to stop all alcohol intake  #History of VF arrest #ICD in situ ICD functioning appropriately.  Continue remote monitoring  Follow-up 1 year with APP   Signed, OLE HOLTS, MD, [facility], [facility] 12/16/2023 9:10 AM    Electrophysiology [facility]

## 2023-12-16 ENCOUNTER — Other Ambulatory Visit: Payer: Self-pay

## 2023-12-16 ENCOUNTER — Ambulatory Visit: Attending: Cardiology | Admitting: Cardiology

## 2023-12-16 VITALS — BP 120/80 | HR 75 | Ht 73.0 in | Wt 167.0 lb

## 2023-12-16 DIAGNOSIS — Z95810 Presence of automatic (implantable) cardiac defibrillator: Secondary | ICD-10-CM | POA: Diagnosis not present

## 2023-12-16 DIAGNOSIS — I4901 Ventricular fibrillation: Secondary | ICD-10-CM

## 2023-12-16 MED ORDER — CARVEDILOL 12.5 MG PO TABS: 12.5000 mg | ORAL_TABLET | Freq: Two times a day (BID) | ORAL | 3 refills | Status: AC

## 2023-12-16 NOTE — Patient Instructions (Addendum)
 Medication Instructions:  Your physician has recommended you make the following change in your medication:  1) INCREASE Coreg  (carvedilol ) 12.5 mg twice daily  *If you need a refill on your cardiac medications before your next appointment, please call your pharmacy*  Lab Work: TODAY: BMET and Mg  Follow-Up: At [facility], you and your health needs are our priority.  As part of our continuing mission to provide you with exceptional heart care, our providers are all part of one team.  This team includes your primary Cardiologist (physician) and Advanced Practice Providers or APPs (Physician Assistants and Nurse Practitioners) who all work together to provide you with the care you need, when you need it.  Your next appointment:   1 year  Provider:   You may see OLE ONEIDA HOLTS, MD or one of the following Advanced Practice Providers on your designated Care Team:   Charlies Arthur, [place] Jodie Passey, PA-C Suzann Riddle, NP Daphne Barrack, NP

## 2023-12-17 ENCOUNTER — Ambulatory Visit: Payer: Self-pay

## 2023-12-17 LAB — BASIC METABOLIC PANEL WITH GFR
BUN/Creatinine Ratio: 18 (ref 9–20)
BUN: 15 mg/dL (ref 6–24)
CO2: 24 mmol/L (ref 20–29)
Calcium: 9.1 mg/dL (ref 8.7–10.2)
Chloride: 103 mmol/L (ref 96–106)
Creatinine, Ser: 0.82 mg/dL (ref 0.76–1.27)
Glucose: 87 mg/dL (ref 70–99)
Potassium: 4.4 mmol/L (ref 3.5–5.2)
Sodium: 140 mmol/L (ref 134–144)
eGFR: 106 mL/min/1.73 (ref >59)

## 2023-12-17 LAB — MAGNESIUM: Magnesium: 2.1 mg/dL (ref 1.6–2.3)

## 2023-12-22 NOTE — Progress Notes (Signed)
 Remote ICD transmission.

## 2024-01-28 ENCOUNTER — Ambulatory Visit (INDEPENDENT_AMBULATORY_CARE_PROVIDER_SITE_OTHER): Payer: 59

## 2024-01-28 DIAGNOSIS — I428 Other cardiomyopathies: Secondary | ICD-10-CM | POA: Diagnosis not present

## 2024-02-01 LAB — CUP PACEART REMOTE DEVICE CHECK
Battery Remaining Longevity: 156 mo
Battery Remaining Percentage: 100 %
Brady Statistic RV Percent Paced: 0 %
Date Time Interrogation Session: 20250911022100
HighPow Impedance: 73 Ohm
Implantable Lead Connection Status: 753985
Implantable Lead Implant Date: 20230614
Implantable Lead Location: 753860
Implantable Lead Model: 673
Implantable Lead Serial Number: 196637
Implantable Pulse Generator Implant Date: 20230614
Lead Channel Impedance Value: 479 Ohm
Lead Channel Setting Pacing Amplitude: 2.5 V
Lead Channel Setting Pacing Pulse Width: 0.4 ms
Lead Channel Setting Sensing Sensitivity: 0.5 mV
Pulse Gen Serial Number: 287113
Zone Setting Status: 755011

## 2024-02-03 ENCOUNTER — Other Ambulatory Visit: Payer: Self-pay | Admitting: Internal Medicine

## 2024-02-03 DIAGNOSIS — E0500 Thyrotoxicosis with diffuse goiter without thyrotoxic crisis or storm: Secondary | ICD-10-CM

## 2024-02-04 ENCOUNTER — Other Ambulatory Visit: Payer: Self-pay | Admitting: Internal Medicine

## 2024-02-04 ENCOUNTER — Ambulatory Visit: Payer: Self-pay | Admitting: Cardiology

## 2024-02-04 DIAGNOSIS — E0500 Thyrotoxicosis with diffuse goiter without thyrotoxic crisis or storm: Secondary | ICD-10-CM

## 2024-02-04 NOTE — Telephone Encounter (Signed)
 Requested medication (s) are due for refill today: yes  Requested medication (s) are on the active medication list: yes  Last refill:  12/07/23 #60 tabs   Future visit scheduled: no  Notes to clinic:  overdue lab work    Requested Prescriptions  Pending Prescriptions Disp Refills   methimazole  (TAPAZOLE ) 10 MG tablet [Pharmacy Med Name: methIMAzole  10 MG Oral Tablet] 60 tablet 0    Sig: Take 1 tablet by mouth once daily     Not Delegated - Endocrinology:  Hyperthyroid Agents Failed - 02/04/2024  3:43 PM      Failed - This refill cannot be delegated      Failed - TSH in normal range and within 180 days    TSH  Date Value Ref Range Status  12/04/2023 5.32 (H) 0.40 - 4.50 mIU/L Final         Failed - T4 free in normal range and within 180 days    T4, Total  Date Value Ref Range Status  12/04/2023 3.9 (L) 4.9 - 10.5 mcg/dL Final         Failed - Valid encounter within last 6 months    Recent Outpatient Visits           6 months ago Graves disease   [facility]ardo Fend, DO   7 months ago Hyperthyroidism   [facility] Bernardo Fend, DO   7 months ago Unintentional weight loss   [facility] Bernardo Fend, DO              Passed - T3 Total in normal range and within 180 days    No results found for: T3TOTAL, T3FREE

## 2024-02-05 ENCOUNTER — Other Ambulatory Visit: Payer: Self-pay | Admitting: Internal Medicine

## 2024-02-05 DIAGNOSIS — E0500 Thyrotoxicosis with diffuse goiter without thyrotoxic crisis or storm: Secondary | ICD-10-CM

## 2024-02-05 NOTE — Progress Notes (Signed)
Remote ICD Transmission.

## 2024-02-05 NOTE — Telephone Encounter (Signed)
 Requested medication (s) are due for refill today: yes  Requested medication (s) are on the active medication list: yes  Last refill:  12/07/23 #60  Future visit scheduled: no  Notes to clinic:  med not delegated to NT to RF   Requested Prescriptions  Pending Prescriptions Disp Refills   methimazole  (TAPAZOLE ) 10 MG tablet [Pharmacy Med Name: methIMAzole  10 MG Oral Tablet] 60 tablet 0    Sig: Take 1 tablet by mouth twice daily     Not Delegated - Endocrinology:  Hyperthyroid Agents Failed - 02/05/2024  1:26 PM      Failed - This refill cannot be delegated      Failed - TSH in normal range and within 180 days    TSH  Date Value Ref Range Status  12/04/2023 5.32 (H) 0.40 - 4.50 mIU/L Final         Failed - T4 free in normal range and within 180 days    T4, Total  Date Value Ref Range Status  12/04/2023 3.9 (L) 4.9 - 10.5 mcg/dL Final         Failed - Valid encounter within last 6 months    Recent Outpatient Visits           6 months ago Graves disease   [facility] Bernardo Fend, DO   7 months ago Hyperthyroidism   [facility] Bernardo Fend, DO   7 months ago Unintentional weight loss   [facility] Bernardo Fend, DO              Passed - T3 Total in normal range and within 180 days    No results found for: T3TOTAL, T3FREE

## 2024-02-06 LAB — THYROID PANEL WITH TSH
Free Thyroxine Index: 2.6 (ref 1.4–3.8)
T3 Uptake: 29 % (ref 22–35)
T4, Total: 8.9 ug/dL (ref 4.9–10.5)
TSH: 0.01 m[IU]/L — ABNORMAL LOW (ref 0.40–4.50)

## 2024-02-08 ENCOUNTER — Ambulatory Visit: Payer: Self-pay | Admitting: Internal Medicine

## 2024-02-08 DIAGNOSIS — E0500 Thyrotoxicosis with diffuse goiter without thyrotoxic crisis or storm: Secondary | ICD-10-CM

## 2024-02-08 MED ORDER — METHIMAZOLE 10 MG PO TABS: 10.0000 mg | ORAL_TABLET | Freq: Every day | ORAL | 0 refills | Status: DC

## 2024-03-17 ENCOUNTER — Telehealth: Admitting: Physician Assistant

## 2024-03-17 DIAGNOSIS — K0889 Other specified disorders of teeth and supporting structures: Secondary | ICD-10-CM | POA: Diagnosis not present

## 2024-03-17 DIAGNOSIS — K047 Periapical abscess without sinus: Secondary | ICD-10-CM

## 2024-03-17 MED ORDER — AMOXICILLIN-POT CLAVULANATE 875-125 MG PO TABS: 1.0000 | ORAL_TABLET | Freq: Two times a day (BID) | ORAL | 0 refills | Status: DC

## 2024-03-17 NOTE — Progress Notes (Signed)

## 2024-04-28 ENCOUNTER — Ambulatory Visit: Payer: 59

## 2024-04-28 DIAGNOSIS — I428 Other cardiomyopathies: Secondary | ICD-10-CM

## 2024-04-29 LAB — CUP PACEART REMOTE DEVICE CHECK
Battery Remaining Longevity: 156 mo
Battery Remaining Percentage: 100 %
Brady Statistic RV Percent Paced: 0 %
Date Time Interrogation Session: 20251211022100
HighPow Impedance: 74 Ohm
Implantable Lead Connection Status: 753985
Implantable Lead Implant Date: 20230614
Implantable Lead Location: 753860
Implantable Lead Model: 673
Implantable Lead Serial Number: 196637
Implantable Pulse Generator Implant Date: 20230614
Lead Channel Impedance Value: 505 Ohm
Lead Channel Setting Pacing Amplitude: 2.5 V
Lead Channel Setting Pacing Pulse Width: 0.4 ms
Lead Channel Setting Sensing Sensitivity: 0.5 mV
Pulse Gen Serial Number: 287113
Zone Setting Status: 755011

## 2024-05-05 NOTE — Progress Notes (Signed)
 Remote ICD Transmission

## 2024-05-06 ENCOUNTER — Ambulatory Visit: Payer: Self-pay | Admitting: Cardiology

## 2024-05-13 ENCOUNTER — Other Ambulatory Visit: Payer: Self-pay

## 2024-05-13 ENCOUNTER — Telehealth: Payer: Self-pay

## 2024-05-13 DIAGNOSIS — E0500 Thyrotoxicosis with diffuse goiter without thyrotoxic crisis or storm: Secondary | ICD-10-CM

## 2024-05-13 MED ORDER — METHIMAZOLE 10 MG PO TABS: 10.0000 mg | ORAL_TABLET | Freq: Every day | ORAL | 0 refills | Status: AC

## 2024-05-13 NOTE — Telephone Encounter (Signed)
 Refill request on methimazole  (TAPAZOLE )  to [facility] on [address]. Pt has an app on 05/16/24 with Brittany wall

## 2024-05-16 ENCOUNTER — Ambulatory Visit: Admitting: Family Medicine

## 2024-05-23 ENCOUNTER — Ambulatory Visit: Admitting: Family Medicine

## 2024-05-23 ENCOUNTER — Encounter: Payer: Self-pay | Admitting: Family Medicine

## 2024-05-23 VITALS — BP 116/68 | HR 77 | Resp 16 | Ht 73.0 in | Wt 172.0 lb

## 2024-05-23 DIAGNOSIS — E0500 Thyrotoxicosis with diffuse goiter without thyrotoxic crisis or storm: Secondary | ICD-10-CM

## 2024-05-23 DIAGNOSIS — R222 Localized swelling, mass and lump, trunk: Secondary | ICD-10-CM

## 2024-05-23 NOTE — Progress Notes (Signed)
 "  Established Patient Office Visit  Subjective   Patient ID: Blake Moore, male    DOB: [birth year]  Age: 54 y.o. MRN: [id number]  Chief Complaint  Patient presents with   Medication Refill    HPI  Blake Moore is a 54 year old male seen in office for medication refills. He is a new patient to myself, however established in office. He voices he had been out of medication for 3 to 4 days, however restarted medication for the last one week. He has been taking Methimazole  10mg  daily. Inquired about prior endo referrals placed and he does not recall seeing an endocrinologist nor does he recall being contacted by endocrinology.   He voices he has a knot on his back for a little while, at least over one year. He voices he had his PCP look at this before. He voices one week ago this area began to feel funny. He denies pain and does not believe that it has grown in size. He shows a lump that is on his right side back. There is another smaller palpable lump on the left side of his back that he was unaware was there. He denies pain with palpation of the areas.    Review of Systems  Constitutional:  Negative for malaise/fatigue and weight loss.  Respiratory:  Negative for shortness of breath.   Cardiovascular:  Negative for chest pain and palpitations.  Neurological:  Negative for dizziness.      Objective:     BP 116/68   Pulse 77   Resp 16   Ht 6' 1 (1.854 m)   Wt 172 lb (78 kg)   SpO2 99%   BMI 22.69 kg/m  BP Readings from Last 3 Encounters:  05/23/24 116/68  12/16/23 120/80  07/20/23 120/70   Wt Readings from Last 3 Encounters:  05/23/24 172 lb (78 kg)  12/16/23 167 lb (75.8 kg)  07/20/23 152 lb 1.6 oz (69 kg)      Physical Exam Constitutional:      Appearance: Normal appearance.  Cardiovascular:     Rate and Rhythm: Normal rate and regular rhythm.  Pulmonary:     Effort: Pulmonary effort is normal.     Breath sounds: Normal breath sounds.  Skin:    General: Skin  is warm and dry.     Comments: Palpable lump on left upper back, non-tender to palpation without rash or other skin discoloration. Palpable lump on right mid back, non-tender to palpation without rash or other skin discoloration.   Neurological:     General: No focal deficit present.     Mental Status: He is alert and oriented to person, place, and time.  Psychiatric:        Mood and Affect: Mood normal.        Behavior: Behavior normal.     Last thyroid  functions Lab Results  Component Value Date   TSH 0.01 (L) 02/05/2024   T4TOTAL 8.9 02/05/2024      Assessment & Plan:   Assessment & Plan Graves disease Patient is a 54 year old male seen today for medication refills. He has been taking Methimazole  10mg  daily. As per HPI he admits he had ran out of medication for 3 to 4 days but for the last one week has been taking medication consistently once daily as prescribed. Methimazole  10mg  daily since 11/2023; last dose change in 11/2023 due to TSH being too high at time labs were completed. Graves disease remains uncontrolled with TSH  of 0.01 in 01/2024, however T4 within normal range at 8.9.  -Update labs -Discussed referral to endocrinology, however he declines at this time. He does voice if TSH remains abnormal he would be agreeable to endocrinology referral at this time.  -Plan for endo referral if TSH remains abnormal -Patient does not need refill at this time. Refill medication pending lab results. -Advised 3 month f/u with PCP Orders:   Thyroid  Panel With TSH  Lump of skin of back Two palpable, non-tender to palpation lumps on the patient's torso. Smaller palpable lump on the left side of back that patient was unaware was there. Right side palpable lump, larger than left side, however also non-tender to palpation. As per HPI he states a week ago the lump on the right side felt funny but denies pain or increase in size.  Presentation and symptoms as described most consistent with  lipoma. See physical exam notation.   -Discussed ultrasound referral for further work up and characterization, however patient declines at this time -Strict return precautions include increase in size, pain, discoloration or other new worrisome symptoms  -F/u with PCP in 3 months         Return in about 3 months (around 08/21/2024) for chronic condition follow up with PCP, Dr. Bernardo.    LAYMON LOISE CORE, FNP "

## 2024-05-25 ENCOUNTER — Ambulatory Visit: Payer: Self-pay | Admitting: Internal Medicine

## 2024-05-25 LAB — THYROID PANEL WITH TSH
Free Thyroxine Index: 4.1 — ABNORMAL HIGH (ref 1.4–3.8)
T3 Uptake: 32 % (ref 22–35)
T4, Total: 12.8 ug/dL — ABNORMAL HIGH (ref 4.9–10.5)
TSH: <0.01 m[IU]/L — ABNORMAL LOW (ref 0.40–4.50)

## 2024-07-28 ENCOUNTER — Ambulatory Visit

## 2024-10-27 ENCOUNTER — Ambulatory Visit

## 2025-01-26 ENCOUNTER — Ambulatory Visit

## 2025-04-27 ENCOUNTER — Ambulatory Visit
